# Patient Record
Sex: Male | Born: 1960 | Race: White | Hispanic: No | Marital: Married | Smoking: Current some day smoker
Health system: Southern US, Community
[De-identification: ages and names within clinical notes are randomized; demographics above are authoritative.]

## PROBLEM LIST (undated history)

## (undated) DIAGNOSIS — G5600 Carpal tunnel syndrome, unspecified upper limb: Secondary | ICD-10-CM

## (undated) DIAGNOSIS — J309 Allergic rhinitis, unspecified: Secondary | ICD-10-CM

## (undated) DIAGNOSIS — R1031 Right lower quadrant pain: Secondary | ICD-10-CM

## (undated) DIAGNOSIS — E785 Hyperlipidemia, unspecified: Secondary | ICD-10-CM

## (undated) DIAGNOSIS — R7309 Other abnormal glucose: Secondary | ICD-10-CM

## (undated) DIAGNOSIS — M1990 Unspecified osteoarthritis, unspecified site: Secondary | ICD-10-CM

## (undated) DIAGNOSIS — I10 Essential (primary) hypertension: Secondary | ICD-10-CM

## (undated) DIAGNOSIS — F909 Attention-deficit hyperactivity disorder, unspecified type: Secondary | ICD-10-CM

## (undated) DIAGNOSIS — S22000A Wedge compression fracture of unspecified thoracic vertebra, initial encounter for closed fracture: Secondary | ICD-10-CM

## (undated) DIAGNOSIS — I4510 Unspecified right bundle-branch block: Secondary | ICD-10-CM

## (undated) DIAGNOSIS — I2510 Atherosclerotic heart disease of native coronary artery without angina pectoris: Secondary | ICD-10-CM

## (undated) DIAGNOSIS — G609 Hereditary and idiopathic neuropathy, unspecified: Secondary | ICD-10-CM

## (undated) DIAGNOSIS — N200 Calculus of kidney: Secondary | ICD-10-CM

## (undated) DIAGNOSIS — G4730 Sleep apnea, unspecified: Secondary | ICD-10-CM

## (undated) DIAGNOSIS — M545 Low back pain: Secondary | ICD-10-CM

## (undated) DIAGNOSIS — S139XXA Sprain of joints and ligaments of unspecified parts of neck, initial encounter: Secondary | ICD-10-CM

## (undated) DIAGNOSIS — F411 Generalized anxiety disorder: Secondary | ICD-10-CM

## (undated) DIAGNOSIS — I639 Cerebral infarction, unspecified: Secondary | ICD-10-CM

## (undated) DIAGNOSIS — C61 Malignant neoplasm of prostate: Secondary | ICD-10-CM

## (undated) DIAGNOSIS — S239XXA Sprain of unspecified parts of thorax, initial encounter: Secondary | ICD-10-CM

## (undated) HISTORY — DX: Calculus of kidney: N20.0

## (undated) HISTORY — DX: Wedge compression fracture of unspecified thoracic vertebra, initial encounter for closed fracture: S22.000A

## (undated) HISTORY — DX: Carpal tunnel syndrome, unspecified upper limb: G56.00

## (undated) HISTORY — DX: Sprain of unspecified parts of thorax, initial encounter: S23.9XXA

## (undated) HISTORY — DX: Unspecified osteoarthritis, unspecified site: M19.90

## (undated) HISTORY — DX: Essential (primary) hypertension: I10

## (undated) HISTORY — DX: Right lower quadrant pain: R10.31

## (undated) HISTORY — DX: Sleep apnea, unspecified: G47.30

## (undated) HISTORY — DX: Hereditary and idiopathic neuropathy, unspecified: G60.9

## (undated) HISTORY — DX: Secondary malignant neoplasm of other specified sites: C61

## (undated) HISTORY — DX: Generalized anxiety disorder: F41.1

## (undated) HISTORY — DX: Allergic rhinitis, unspecified: J30.9

## (undated) HISTORY — PX: KNEE ARTHROSCOPY: SHX127

## (undated) HISTORY — DX: Hyperlipidemia, unspecified: E78.5

## (undated) HISTORY — PX: BIOPSY PROSTATE: PRO28

## (undated) HISTORY — DX: Cerebral infarction, unspecified: I63.9

## (undated) HISTORY — DX: Other abnormal glucose: R73.09

## (undated) HISTORY — DX: Sprain of joints and ligaments of unspecified parts of neck, initial encounter: S13.9XXA

## (undated) HISTORY — DX: Low back pain: M54.5

---

## 2001-03-15 ENCOUNTER — Encounter: Payer: Self-pay | Admitting: Emergency Medicine

## 2001-03-15 ENCOUNTER — Observation Stay (HOSPITAL_COMMUNITY): Admission: EM | Admit: 2001-03-15 | Discharge: 2001-03-16 | Payer: Self-pay | Admitting: Emergency Medicine

## 2001-03-16 ENCOUNTER — Encounter: Payer: Self-pay | Admitting: *Deleted

## 2002-04-27 ENCOUNTER — Emergency Department (HOSPITAL_COMMUNITY): Admission: EM | Admit: 2002-04-27 | Discharge: 2002-04-27 | Payer: Self-pay | Admitting: Emergency Medicine

## 2002-10-29 ENCOUNTER — Emergency Department (HOSPITAL_COMMUNITY): Admission: AC | Admit: 2002-10-29 | Discharge: 2002-10-29 | Payer: Self-pay

## 2002-10-29 ENCOUNTER — Encounter: Payer: Self-pay | Admitting: Emergency Medicine

## 2002-12-02 ENCOUNTER — Encounter: Payer: Self-pay | Admitting: Family Medicine

## 2002-12-02 ENCOUNTER — Encounter: Admission: RE | Admit: 2002-12-02 | Discharge: 2002-12-02 | Payer: Self-pay | Admitting: Family Medicine

## 2002-12-04 ENCOUNTER — Encounter: Payer: Self-pay | Admitting: Family Medicine

## 2002-12-04 ENCOUNTER — Encounter: Admission: RE | Admit: 2002-12-04 | Discharge: 2002-12-04 | Payer: Self-pay | Admitting: Family Medicine

## 2002-12-20 ENCOUNTER — Encounter: Payer: Self-pay | Admitting: Family Medicine

## 2004-08-08 ENCOUNTER — Ambulatory Visit: Payer: Self-pay | Admitting: Family Medicine

## 2004-08-19 ENCOUNTER — Ambulatory Visit: Payer: Self-pay | Admitting: Family Medicine

## 2004-09-03 ENCOUNTER — Ambulatory Visit: Payer: Self-pay | Admitting: Family Medicine

## 2004-09-24 ENCOUNTER — Ambulatory Visit: Payer: Self-pay | Admitting: Family Medicine

## 2005-06-26 ENCOUNTER — Ambulatory Visit: Payer: Self-pay | Admitting: Family Medicine

## 2005-07-17 ENCOUNTER — Ambulatory Visit: Payer: Self-pay | Admitting: Family Medicine

## 2005-08-15 ENCOUNTER — Ambulatory Visit: Payer: Self-pay | Admitting: Family Medicine

## 2005-09-05 ENCOUNTER — Ambulatory Visit (HOSPITAL_COMMUNITY): Admission: RE | Admit: 2005-09-05 | Discharge: 2005-09-05 | Payer: Self-pay | Admitting: *Deleted

## 2006-05-06 ENCOUNTER — Ambulatory Visit: Payer: Self-pay | Admitting: Family Medicine

## 2006-05-06 LAB — CONVERTED CEMR LAB
ALT: 25 units/L (ref 0–40)
AST: 20 units/L (ref 0–37)
Albumin: 4.2 g/dL (ref 3.5–5.2)
Alkaline Phosphatase: 48 units/L (ref 39–117)
BUN: 21 mg/dL (ref 6–23)
Basophils Absolute: 0.1 10*3/uL (ref 0.0–0.1)
Basophils Relative: 0.7 % (ref 0.0–1.0)
CO2: 30 meq/L (ref 19–32)
Calcium: 9.4 mg/dL (ref 8.4–10.5)
Chloride: 103 meq/L (ref 96–112)
Cholesterol: 183 mg/dL (ref 0–200)
Creatinine, Ser: 1.2 mg/dL (ref 0.4–1.5)
Direct LDL: 108.9 mg/dL
Eosinophils Absolute: 0.1 10*3/uL (ref 0.0–0.6)
Eosinophils Relative: 1.5 % (ref 0.0–5.0)
GFR calc Af Amer: 83 mL/min
GFR calc non Af Amer: 69 mL/min
Glucose, Bld: 93 mg/dL (ref 70–99)
HCT: 40.4 % (ref 39.0–52.0)
HDL: 33 mg/dL — ABNORMAL LOW (ref >39.0)
Hemoglobin: 14.2 g/dL (ref 13.0–17.0)
Lymphocytes Relative: 25 % (ref 12.0–46.0)
MCHC: 35.2 g/dL (ref 30.0–36.0)
MCV: 85.2 fL (ref 78.0–100.0)
Monocytes Absolute: 0.5 10*3/uL (ref 0.2–0.7)
Monocytes Relative: 5.4 % (ref 3.0–11.0)
Neutro Abs: 6.7 10*3/uL (ref 1.4–7.7)
Neutrophils Relative %: 67.4 % (ref 43.0–77.0)
Platelets: 173 10*3/uL (ref 150–400)
Potassium: 3.9 meq/L (ref 3.5–5.1)
RBC: 4.74 M/uL (ref 4.22–5.81)
RDW: 11.8 % (ref 11.5–14.6)
Sodium: 140 meq/L (ref 135–145)
TSH: 1.15 microintl units/mL (ref 0.35–5.50)
Total Bilirubin: 0.9 mg/dL (ref 0.3–1.2)
Total CHOL/HDL Ratio: 5.5
Total Protein: 7.2 g/dL (ref 6.0–8.3)
Triglycerides: 272 mg/dL (ref 0–149)
VLDL: 54 mg/dL — ABNORMAL HIGH (ref 0–40)
WBC: 9.9 10*3/uL (ref 4.5–10.5)

## 2006-07-20 ENCOUNTER — Ambulatory Visit: Payer: Self-pay | Admitting: Family Medicine

## 2006-09-23 ENCOUNTER — Ambulatory Visit: Payer: Self-pay | Admitting: Family Medicine

## 2006-09-23 LAB — CONVERTED CEMR LAB
ALT: 39 units/L (ref 0–40)
AST: 27 units/L (ref 0–37)
Albumin: 3.7 g/dL (ref 3.5–5.2)
Alkaline Phosphatase: 53 units/L (ref 39–117)
Amylase: 20 units/L — ABNORMAL LOW (ref 27–131)
BUN: 18 mg/dL (ref 6–23)
Basophils Absolute: 0 10*3/uL (ref 0.0–0.1)
Basophils Relative: 0.1 % (ref 0.0–1.0)
Bilirubin, Direct: 0.1 mg/dL (ref 0.0–0.3)
CO2: 26 meq/L (ref 19–32)
Calcium: 8.9 mg/dL (ref 8.4–10.5)
Chloride: 100 meq/L (ref 96–112)
Creatinine, Ser: 1.1 mg/dL (ref 0.4–1.5)
Eosinophils Absolute: 0 10*3/uL (ref 0.0–0.6)
Eosinophils Relative: 0.6 % (ref 0.0–5.0)
GFR calc Af Amer: 92 mL/min
GFR calc non Af Amer: 76 mL/min
Glucose, Bld: 98 mg/dL (ref 70–99)
HCT: 39.2 % (ref 39.0–52.0)
Hemoglobin: 13.7 g/dL (ref 13.0–17.0)
Lymphocytes Relative: 18.8 % (ref 12.0–46.0)
MCHC: 35 g/dL (ref 30.0–36.0)
MCV: 84.6 fL (ref 78.0–100.0)
Monocytes Absolute: 0.6 10*3/uL (ref 0.2–0.7)
Monocytes Relative: 7 % (ref 3.0–11.0)
Neutro Abs: 6.1 10*3/uL (ref 1.4–7.7)
Neutrophils Relative %: 73.5 % (ref 43.0–77.0)
Platelets: 116 10*3/uL — ABNORMAL LOW (ref 150–400)
Potassium: 3.4 meq/L — ABNORMAL LOW (ref 3.5–5.1)
RBC: 4.64 M/uL (ref 4.22–5.81)
RDW: 11.9 % (ref 11.5–14.6)
Sodium: 137 meq/L (ref 135–145)
Total Bilirubin: 0.9 mg/dL (ref 0.3–1.2)
Total Protein: 6.6 g/dL (ref 6.0–8.3)
WBC: 8.2 10*3/uL (ref 4.5–10.5)

## 2006-12-21 DIAGNOSIS — E785 Hyperlipidemia, unspecified: Secondary | ICD-10-CM | POA: Insufficient documentation

## 2006-12-21 DIAGNOSIS — I10 Essential (primary) hypertension: Secondary | ICD-10-CM

## 2006-12-21 HISTORY — DX: Hyperlipidemia, unspecified: E78.5

## 2006-12-21 HISTORY — DX: Essential (primary) hypertension: I10

## 2007-08-13 ENCOUNTER — Ambulatory Visit: Payer: Self-pay | Admitting: Family Medicine

## 2007-08-13 DIAGNOSIS — G4730 Sleep apnea, unspecified: Secondary | ICD-10-CM

## 2007-08-13 DIAGNOSIS — G5600 Carpal tunnel syndrome, unspecified upper limb: Secondary | ICD-10-CM | POA: Insufficient documentation

## 2007-08-13 HISTORY — DX: Carpal tunnel syndrome, unspecified upper limb: G56.00

## 2007-08-13 HISTORY — DX: Sleep apnea, unspecified: G47.30

## 2007-08-13 LAB — CONVERTED CEMR LAB
Bilirubin Urine: NEGATIVE
Blood in Urine, dipstick: NEGATIVE
Glucose, Urine, Semiquant: NEGATIVE
Nitrite: NEGATIVE
Specific Gravity, Urine: 1.025
Urobilinogen, UA: 0.2
WBC Urine, dipstick: NEGATIVE
pH: 6.5

## 2007-08-17 LAB — CONVERTED CEMR LAB
ALT: 24 units/L (ref 0–53)
AST: 17 units/L (ref 0–37)
Albumin: 4.3 g/dL (ref 3.5–5.2)
Alkaline Phosphatase: 51 units/L (ref 39–117)
BUN: 17 mg/dL (ref 6–23)
Basophils Absolute: 0 10*3/uL (ref 0.0–0.1)
Basophils Relative: 0 % (ref 0–1)
Bilirubin, Direct: <0.1 mg/dL (ref 0.0–0.3)
CO2: 26 meq/L (ref 19–32)
Calcium: 9.7 mg/dL (ref 8.4–10.5)
Chloride: 106 meq/L (ref 96–112)
Creatinine, Ser: 1.06 mg/dL (ref 0.40–1.50)
Eosinophils Absolute: 0.2 10*3/uL (ref 0.0–0.7)
Eosinophils Relative: 2 % (ref 0–5)
Glucose, Bld: 89 mg/dL (ref 70–99)
HCT: 41.6 % (ref 39.0–52.0)
Hemoglobin: 13.6 g/dL (ref 13.0–17.0)
Lymphocytes Relative: 38 % (ref 12–46)
Lymphs Abs: 2.8 10*3/uL (ref 0.7–4.0)
MCHC: 32.7 g/dL (ref 30.0–36.0)
MCV: 87.4 fL (ref 78.0–100.0)
Monocytes Absolute: 0.5 10*3/uL (ref 0.1–1.0)
Monocytes Relative: 6 % (ref 3–12)
Neutro Abs: 3.8 10*3/uL (ref 1.7–7.7)
Neutrophils Relative %: 52 % (ref 43–77)
Platelets: 166 10*3/uL (ref 150–400)
Potassium: 4.3 meq/L (ref 3.5–5.3)
RBC: 4.76 M/uL (ref 4.22–5.81)
RDW: 13.1 % (ref 11.5–15.5)
Sodium: 141 meq/L (ref 135–145)
TSH: 2.563 microintl units/mL (ref 0.350–5.50)
Total Bilirubin: 0.3 mg/dL (ref 0.3–1.2)
Total Protein: 7.1 g/dL (ref 6.0–8.3)
WBC: 7.2 10*3/uL (ref 4.0–10.5)

## 2007-08-30 ENCOUNTER — Ambulatory Visit (HOSPITAL_BASED_OUTPATIENT_CLINIC_OR_DEPARTMENT_OTHER): Admission: RE | Admit: 2007-08-30 | Discharge: 2007-08-30 | Payer: Self-pay | Admitting: Pulmonary Disease

## 2007-08-30 ENCOUNTER — Ambulatory Visit: Payer: Self-pay | Admitting: Pulmonary Disease

## 2007-08-30 DIAGNOSIS — J309 Allergic rhinitis, unspecified: Secondary | ICD-10-CM | POA: Insufficient documentation

## 2007-08-30 HISTORY — DX: Allergic rhinitis, unspecified: J30.9

## 2007-09-16 ENCOUNTER — Ambulatory Visit: Payer: Self-pay | Admitting: Pulmonary Disease

## 2007-09-16 ENCOUNTER — Telehealth (INDEPENDENT_AMBULATORY_CARE_PROVIDER_SITE_OTHER): Payer: Self-pay | Admitting: *Deleted

## 2007-10-04 ENCOUNTER — Ambulatory Visit: Payer: Self-pay | Admitting: Pulmonary Disease

## 2007-11-03 ENCOUNTER — Encounter: Payer: Self-pay | Admitting: Pulmonary Disease

## 2007-11-05 ENCOUNTER — Telehealth: Payer: Self-pay | Admitting: Family Medicine

## 2007-11-10 ENCOUNTER — Telehealth: Payer: Self-pay | Admitting: Pulmonary Disease

## 2008-01-31 ENCOUNTER — Telehealth: Payer: Self-pay | Admitting: Family Medicine

## 2008-04-03 ENCOUNTER — Ambulatory Visit: Payer: Self-pay | Admitting: Family Medicine

## 2008-05-01 ENCOUNTER — Ambulatory Visit: Payer: Self-pay | Admitting: Family Medicine

## 2008-08-02 ENCOUNTER — Ambulatory Visit: Payer: Self-pay | Admitting: Family Medicine

## 2008-08-02 DIAGNOSIS — M1990 Unspecified osteoarthritis, unspecified site: Secondary | ICD-10-CM

## 2008-08-02 HISTORY — DX: Unspecified osteoarthritis, unspecified site: M19.90

## 2008-08-02 LAB — CONVERTED CEMR LAB: Rapid Strep: NEGATIVE

## 2008-08-28 ENCOUNTER — Emergency Department (HOSPITAL_COMMUNITY): Admission: EM | Admit: 2008-08-28 | Discharge: 2008-08-28 | Payer: Self-pay | Admitting: Emergency Medicine

## 2008-08-28 ENCOUNTER — Encounter: Payer: Self-pay | Admitting: Family Medicine

## 2008-08-28 DIAGNOSIS — S139XXA Sprain of joints and ligaments of unspecified parts of neck, initial encounter: Secondary | ICD-10-CM

## 2008-08-28 HISTORY — DX: Sprain of joints and ligaments of unspecified parts of neck, initial encounter: S13.9XXA

## 2008-08-29 ENCOUNTER — Ambulatory Visit: Payer: Self-pay | Admitting: Family Medicine

## 2008-09-05 ENCOUNTER — Encounter: Admission: RE | Admit: 2008-09-05 | Discharge: 2008-11-06 | Payer: Self-pay | Admitting: Family Medicine

## 2008-09-07 ENCOUNTER — Telehealth: Payer: Self-pay | Admitting: Family Medicine

## 2008-09-12 ENCOUNTER — Encounter: Payer: Self-pay | Admitting: Family Medicine

## 2008-09-13 ENCOUNTER — Ambulatory Visit: Payer: Self-pay | Admitting: Family Medicine

## 2008-09-13 DIAGNOSIS — S239XXA Sprain of unspecified parts of thorax, initial encounter: Secondary | ICD-10-CM

## 2008-09-13 HISTORY — DX: Sprain of unspecified parts of thorax, initial encounter: S23.9XXA

## 2008-09-20 ENCOUNTER — Ambulatory Visit (HOSPITAL_COMMUNITY): Admission: RE | Admit: 2008-09-20 | Discharge: 2008-09-20 | Payer: Self-pay | Admitting: Family Medicine

## 2008-09-20 ENCOUNTER — Encounter: Payer: Self-pay | Admitting: Family Medicine

## 2008-10-02 ENCOUNTER — Ambulatory Visit: Payer: Self-pay | Admitting: Family Medicine

## 2008-10-02 DIAGNOSIS — M545 Low back pain, unspecified: Secondary | ICD-10-CM | POA: Insufficient documentation

## 2008-10-02 HISTORY — DX: Low back pain, unspecified: M54.50

## 2008-10-11 ENCOUNTER — Encounter: Payer: Self-pay | Admitting: Family Medicine

## 2008-10-27 ENCOUNTER — Encounter: Payer: Self-pay | Admitting: Family Medicine

## 2008-10-30 ENCOUNTER — Ambulatory Visit: Payer: Self-pay | Admitting: Family Medicine

## 2008-11-03 ENCOUNTER — Encounter: Payer: Self-pay | Admitting: Family Medicine

## 2008-11-17 ENCOUNTER — Ambulatory Visit: Payer: Self-pay | Admitting: Family Medicine

## 2008-11-17 DIAGNOSIS — F411 Generalized anxiety disorder: Secondary | ICD-10-CM

## 2008-11-17 HISTORY — DX: Generalized anxiety disorder: F41.1

## 2008-11-30 ENCOUNTER — Encounter: Payer: Self-pay | Admitting: Family Medicine

## 2008-12-20 ENCOUNTER — Encounter: Payer: Self-pay | Admitting: Family Medicine

## 2009-01-03 ENCOUNTER — Ambulatory Visit: Payer: Self-pay | Admitting: Family Medicine

## 2009-01-03 LAB — CONVERTED CEMR LAB
Bilirubin Urine: NEGATIVE
Blood in Urine, dipstick: NEGATIVE
Glucose, Urine, Semiquant: NEGATIVE
Nitrite: NEGATIVE
Specific Gravity, Urine: 1.025
Urobilinogen, UA: 0.2
WBC Urine, dipstick: NEGATIVE
pH: 6

## 2009-01-05 LAB — CONVERTED CEMR LAB
ALT: 39 units/L (ref 0–53)
AST: 30 units/L (ref 0–37)
Albumin: 4.4 g/dL (ref 3.5–5.2)
Alkaline Phosphatase: 55 units/L (ref 39–117)
BUN: 20 mg/dL (ref 6–23)
Basophils Absolute: 0 10*3/uL (ref 0.0–0.1)
Basophils Relative: 0.7 % (ref 0.0–3.0)
Bilirubin, Direct: 0 mg/dL (ref 0.0–0.3)
CO2: 31 meq/L (ref 19–32)
Calcium: 9.4 mg/dL (ref 8.4–10.5)
Chloride: 107 meq/L (ref 96–112)
Cholesterol: 193 mg/dL (ref 0–200)
Creatinine, Ser: 1 mg/dL (ref 0.4–1.5)
Direct LDL: 131.5 mg/dL
Eosinophils Absolute: 0.1 10*3/uL (ref 0.0–0.7)
Eosinophils Relative: 2 % (ref 0.0–5.0)
GFR calc non Af Amer: 83.98 mL/min (ref >60)
Glucose, Bld: 96 mg/dL (ref 70–99)
HCT: 42.6 % (ref 39.0–52.0)
HDL: 31.5 mg/dL — ABNORMAL LOW (ref >39.00)
Hemoglobin: 14.7 g/dL (ref 13.0–17.0)
Lymphocytes Relative: 32.1 % (ref 12.0–46.0)
Lymphs Abs: 2.3 10*3/uL (ref 0.7–4.0)
MCHC: 34.5 g/dL (ref 30.0–36.0)
MCV: 85.8 fL (ref 78.0–100.0)
Monocytes Absolute: 0.4 10*3/uL (ref 0.1–1.0)
Monocytes Relative: 5.5 % (ref 3.0–12.0)
Neutro Abs: 4.3 10*3/uL (ref 1.4–7.7)
Neutrophils Relative %: 59.7 % (ref 43.0–77.0)
PSA: 1.07 ng/mL (ref 0.10–4.00)
Platelets: 153 10*3/uL (ref 150.0–400.0)
Potassium: 4.2 meq/L (ref 3.5–5.1)
RBC: 4.97 M/uL (ref 4.22–5.81)
RDW: 12.5 % (ref 11.5–14.6)
Sodium: 144 meq/L (ref 135–145)
TSH: 1.89 microintl units/mL (ref 0.35–5.50)
Total Bilirubin: 0.9 mg/dL (ref 0.3–1.2)
Total CHOL/HDL Ratio: 6
Total Protein: 7.5 g/dL (ref 6.0–8.3)
Triglycerides: 213 mg/dL — ABNORMAL HIGH (ref 0.0–149.0)
VLDL: 42.6 mg/dL — ABNORMAL HIGH (ref 0.0–40.0)
WBC: 7.1 10*3/uL (ref 4.5–10.5)

## 2009-01-10 ENCOUNTER — Ambulatory Visit: Payer: Self-pay | Admitting: Family Medicine

## 2009-01-17 ENCOUNTER — Telehealth (INDEPENDENT_AMBULATORY_CARE_PROVIDER_SITE_OTHER): Payer: Self-pay | Admitting: *Deleted

## 2009-01-18 ENCOUNTER — Encounter (HOSPITAL_COMMUNITY): Admission: RE | Admit: 2009-01-18 | Discharge: 2009-03-16 | Payer: Self-pay | Admitting: Cardiology

## 2009-01-18 ENCOUNTER — Encounter: Payer: Self-pay | Admitting: Family Medicine

## 2009-01-18 ENCOUNTER — Ambulatory Visit: Payer: Self-pay

## 2009-01-18 ENCOUNTER — Ambulatory Visit: Payer: Self-pay | Admitting: Cardiology

## 2009-01-18 ENCOUNTER — Ambulatory Visit (HOSPITAL_COMMUNITY): Admission: RE | Admit: 2009-01-18 | Discharge: 2009-01-18 | Payer: Self-pay | Admitting: Cardiology

## 2009-01-26 ENCOUNTER — Telehealth: Payer: Self-pay | Admitting: Family Medicine

## 2009-01-31 ENCOUNTER — Ambulatory Visit: Payer: Self-pay | Admitting: Family Medicine

## 2009-01-31 DIAGNOSIS — R7309 Other abnormal glucose: Secondary | ICD-10-CM

## 2009-01-31 HISTORY — DX: Other abnormal glucose: R73.09

## 2009-02-01 ENCOUNTER — Ambulatory Visit: Payer: Self-pay | Admitting: Family Medicine

## 2009-02-01 ENCOUNTER — Telehealth: Payer: Self-pay | Admitting: Family Medicine

## 2009-02-02 LAB — CONVERTED CEMR LAB
Glucose, 1 Hour GTT: 177 mg/dL
Glucose, 2 hour: 116 mg/dL
Glucose, Fasting: 102 mg/dL — ABNORMAL HIGH (ref 70–99)
Glucose, GTT - 3 Hour: 76 mg/dL

## 2009-02-05 LAB — CONVERTED CEMR LAB
ALT: 30 units/L (ref 0–53)
AST: 20 units/L (ref 0–37)
Albumin: 4.3 g/dL (ref 3.5–5.2)
Alkaline Phosphatase: 53 units/L (ref 39–117)
BUN: 12 mg/dL (ref 6–23)
Basophils Absolute: 0 10*3/uL (ref 0.0–0.1)
Basophils Relative: 0.6 % (ref 0.0–3.0)
Bilirubin, Direct: 0 mg/dL (ref 0.0–0.3)
CO2: 29 meq/L (ref 19–32)
Calcium: 9.3 mg/dL (ref 8.4–10.5)
Chloride: 106 meq/L (ref 96–112)
Creatinine, Ser: 0.9 mg/dL (ref 0.4–1.5)
Eosinophils Absolute: 0.2 10*3/uL (ref 0.0–0.7)
Eosinophils Relative: 2.7 % (ref 0.0–5.0)
GFR calc non Af Amer: 94.81 mL/min (ref >60)
Glucose, Bld: 106 mg/dL — ABNORMAL HIGH (ref 70–99)
HCT: 44.4 % (ref 39.0–52.0)
Hemoglobin: 14.9 g/dL (ref 13.0–17.0)
Hgb A1c MFr Bld: 5.7 % (ref 4.6–6.5)
Lymphocytes Relative: 29.7 % (ref 12.0–46.0)
Lymphs Abs: 2.5 10*3/uL (ref 0.7–4.0)
MCHC: 33.5 g/dL (ref 30.0–36.0)
MCV: 89.7 fL (ref 78.0–100.0)
Monocytes Absolute: 0.3 10*3/uL (ref 0.1–1.0)
Monocytes Relative: 4.2 % (ref 3.0–12.0)
Neutro Abs: 5.3 10*3/uL (ref 1.4–7.7)
Neutrophils Relative %: 62.8 % (ref 43.0–77.0)
Platelets: 164 10*3/uL (ref 150.0–400.0)
Potassium: 4.3 meq/L (ref 3.5–5.1)
RBC: 4.95 M/uL (ref 4.22–5.81)
RDW: 12.3 % (ref 11.5–14.6)
Sodium: 143 meq/L (ref 135–145)
TSH: 1.01 microintl units/mL (ref 0.35–5.50)
Total Bilirubin: 0.6 mg/dL (ref 0.3–1.2)
Total Protein: 6.7 g/dL (ref 6.0–8.3)
WBC: 8.3 10*3/uL (ref 4.5–10.5)

## 2009-02-06 ENCOUNTER — Encounter: Payer: Self-pay | Admitting: Family Medicine

## 2009-02-07 ENCOUNTER — Ambulatory Visit: Payer: Self-pay | Admitting: Gastroenterology

## 2009-02-20 ENCOUNTER — Telehealth: Payer: Self-pay | Admitting: Gastroenterology

## 2009-02-21 ENCOUNTER — Ambulatory Visit: Payer: Self-pay | Admitting: Gastroenterology

## 2009-02-21 ENCOUNTER — Encounter: Payer: Self-pay | Admitting: Gastroenterology

## 2009-02-21 HISTORY — PX: COLONOSCOPY: SHX174

## 2009-02-23 ENCOUNTER — Encounter: Payer: Self-pay | Admitting: Gastroenterology

## 2009-02-23 LAB — HM COLONOSCOPY

## 2009-03-23 ENCOUNTER — Ambulatory Visit: Payer: Self-pay | Admitting: Family Medicine

## 2009-03-23 LAB — CONVERTED CEMR LAB: Rapid Strep: POSITIVE

## 2009-04-19 ENCOUNTER — Telehealth: Payer: Self-pay | Admitting: Family Medicine

## 2009-07-11 ENCOUNTER — Ambulatory Visit: Payer: Self-pay | Admitting: Family Medicine

## 2009-08-07 ENCOUNTER — Ambulatory Visit: Payer: Self-pay | Admitting: Family Medicine

## 2009-08-07 LAB — CONVERTED CEMR LAB
Blood in Urine, dipstick: NEGATIVE
Glucose, Urine, Semiquant: NEGATIVE
Ketones, urine, test strip: NEGATIVE
Nitrite: NEGATIVE
Protein, U semiquant: 30
Specific Gravity, Urine: 1.015
Urobilinogen, UA: 0.2
pH: 6.5

## 2009-08-15 ENCOUNTER — Ambulatory Visit: Payer: Self-pay | Admitting: Family Medicine

## 2009-08-15 DIAGNOSIS — G609 Hereditary and idiopathic neuropathy, unspecified: Secondary | ICD-10-CM

## 2009-08-15 HISTORY — DX: Hereditary and idiopathic neuropathy, unspecified: G60.9

## 2009-10-18 ENCOUNTER — Ambulatory Visit: Payer: Self-pay | Admitting: Family Medicine

## 2009-10-18 ENCOUNTER — Telehealth: Payer: Self-pay | Admitting: Family Medicine

## 2009-10-18 DIAGNOSIS — R1031 Right lower quadrant pain: Secondary | ICD-10-CM

## 2009-10-18 HISTORY — DX: Right lower quadrant pain: R10.31

## 2009-10-18 LAB — CONVERTED CEMR LAB
Blood in Urine, dipstick: NEGATIVE
Glucose, Urine, Semiquant: NEGATIVE
Ketones, urine, test strip: NEGATIVE
Nitrite: NEGATIVE
Specific Gravity, Urine: 1.025
Urobilinogen, UA: 0.2
WBC Urine, dipstick: NEGATIVE
pH: 7

## 2009-10-19 LAB — CONVERTED CEMR LAB
Basophils Absolute: 0.1 10*3/uL (ref 0.0–0.1)
Basophils Relative: 0.5 % (ref 0.0–3.0)
Eosinophils Absolute: 0.1 10*3/uL (ref 0.0–0.7)
Eosinophils Relative: 0.5 % (ref 0.0–5.0)
HCT: 47.1 % (ref 39.0–52.0)
Hemoglobin: 16.3 g/dL (ref 13.0–17.0)
Lymphocytes Relative: 12.6 % (ref 12.0–46.0)
Lymphs Abs: 1.5 10*3/uL (ref 0.7–4.0)
MCHC: 34.5 g/dL (ref 30.0–36.0)
MCV: 88.2 fL (ref 78.0–100.0)
Monocytes Absolute: 0.5 10*3/uL (ref 0.1–1.0)
Monocytes Relative: 4.2 % (ref 3.0–12.0)
Neutro Abs: 10 10*3/uL — ABNORMAL HIGH (ref 1.4–7.7)
Neutrophils Relative %: 82.2 % — ABNORMAL HIGH (ref 43.0–77.0)
Platelets: 159 10*3/uL (ref 150.0–400.0)
RBC: 5.34 M/uL (ref 4.22–5.81)
RDW: 12.8 % (ref 11.5–14.6)
WBC: 12.1 10*3/uL — ABNORMAL HIGH (ref 4.5–10.5)

## 2010-01-03 ENCOUNTER — Ambulatory Visit: Payer: Self-pay | Admitting: Family Medicine

## 2010-01-03 LAB — CONVERTED CEMR LAB
Bilirubin Urine: NEGATIVE
Blood in Urine, dipstick: NEGATIVE
Glucose, Urine, Semiquant: NEGATIVE
Ketones, urine, test strip: NEGATIVE
Nitrite: NEGATIVE
Protein, U semiquant: NEGATIVE
Specific Gravity, Urine: 1.02
Urobilinogen, UA: 0.2
WBC Urine, dipstick: NEGATIVE
pH: 7

## 2010-01-04 LAB — CONVERTED CEMR LAB
ALT: 21 units/L (ref 0–53)
AST: 21 units/L (ref 0–37)
Albumin: 4.3 g/dL (ref 3.5–5.2)
Alkaline Phosphatase: 45 units/L (ref 39–117)
BUN: 19 mg/dL (ref 6–23)
Basophils Absolute: 0.1 10*3/uL (ref 0.0–0.1)
Basophils Relative: 0.5 % (ref 0.0–3.0)
Bilirubin, Direct: 0.1 mg/dL (ref 0.0–0.3)
CO2: 25 meq/L (ref 19–32)
Calcium: 9.4 mg/dL (ref 8.4–10.5)
Chloride: 105 meq/L (ref 96–112)
Cholesterol: 208 mg/dL — ABNORMAL HIGH (ref 0–200)
Creatinine, Ser: 0.8 mg/dL (ref 0.4–1.5)
Direct LDL: 135.4 mg/dL
Eosinophils Absolute: 0.2 10*3/uL (ref 0.0–0.7)
Eosinophils Relative: 2 % (ref 0.0–5.0)
GFR calc non Af Amer: 102.29 mL/min (ref >60)
Glucose, Bld: 91 mg/dL (ref 70–99)
HCT: 44 % (ref 39.0–52.0)
HDL: 32.9 mg/dL — ABNORMAL LOW (ref >39.00)
Hemoglobin: 15 g/dL (ref 13.0–17.0)
Lymphocytes Relative: 26.1 % (ref 12.0–46.0)
Lymphs Abs: 2.7 10*3/uL (ref 0.7–4.0)
MCHC: 34.2 g/dL (ref 30.0–36.0)
MCV: 89.5 fL (ref 78.0–100.0)
Monocytes Absolute: 0.6 10*3/uL (ref 0.1–1.0)
Monocytes Relative: 5.7 % (ref 3.0–12.0)
Neutro Abs: 6.9 10*3/uL (ref 1.4–7.7)
Neutrophils Relative %: 65.7 % (ref 43.0–77.0)
PSA: 1.66 ng/mL (ref 0.10–4.00)
Platelets: 165 10*3/uL (ref 150.0–400.0)
Potassium: 4.6 meq/L (ref 3.5–5.1)
RBC: 4.91 M/uL (ref 4.22–5.81)
RDW: 13.1 % (ref 11.5–14.6)
Sodium: 142 meq/L (ref 135–145)
TSH: 1.25 microintl units/mL (ref 0.35–5.50)
Total Bilirubin: 0.5 mg/dL (ref 0.3–1.2)
Total CHOL/HDL Ratio: 6
Total Protein: 6.8 g/dL (ref 6.0–8.3)
Triglycerides: 170 mg/dL — ABNORMAL HIGH (ref 0.0–149.0)
VLDL: 34 mg/dL (ref 0.0–40.0)
WBC: 10.5 10*3/uL (ref 4.5–10.5)

## 2010-01-11 ENCOUNTER — Encounter: Payer: Self-pay | Admitting: Family Medicine

## 2010-01-11 ENCOUNTER — Ambulatory Visit: Payer: Self-pay | Admitting: Family Medicine

## 2010-03-20 ENCOUNTER — Ambulatory Visit: Payer: Self-pay | Admitting: Family Medicine

## 2010-03-27 ENCOUNTER — Encounter: Payer: Self-pay | Admitting: Family Medicine

## 2010-04-17 ENCOUNTER — Ambulatory Visit
Admission: RE | Admit: 2010-04-17 | Discharge: 2010-04-17 | Payer: Self-pay | Source: Home / Self Care | Attending: Family Medicine | Admitting: Family Medicine

## 2010-04-19 ENCOUNTER — Telehealth: Payer: Self-pay | Admitting: Family Medicine

## 2010-04-25 ENCOUNTER — Encounter: Payer: Self-pay | Admitting: Family Medicine

## 2010-05-13 ENCOUNTER — Telehealth: Payer: Self-pay | Admitting: Family Medicine

## 2010-05-13 ENCOUNTER — Ambulatory Visit: Admit: 2010-05-13 | Payer: Self-pay | Admitting: Family Medicine

## 2010-05-14 NOTE — Assessment & Plan Note (Signed)
Summary: tingling in leg/dm   Vital Signs:  Patient profile:   50 year old male Weight:      223 pounds BMI:     29.53 BP sitting:   136 / 86  (left arm) Cuff size:   regular  Vitals Entered By: Raechel Ache, RN (Aug 15, 2009 10:29 AM) CC: C/o vibrating in R thigh up until last evening.   History of Present Illness: Here initially to discuss a vibration sensation he was feeling in the anterior right thigh intermittently for 2 days. No pain or numbness or weakness. No lower back problems. His prostatic symptoms resolved quickly after we started him on Cipro. Now so far today these vibration sensations seem to have gone away. Also we spoke again about the chronic pain in his neck, and he mentions the possibility of using Prednisone for this.   Allergies (verified): No Known Drug Allergies  Past History:  Past Medical History: Reviewed history from 01/10/2009 and no changes required. ADHD Hypokalemia Kidney Stones thoracic compression fractures, saw Dr. Leslee Home SLEEP APNEA (ICD-780.57) CARPAL TUNNEL SYNDROME (ICD-354.0) HYPERTENSION (ICD-401.9) HYPERLIPIDEMIA (ICD-272.4) Allergic Rhinitis neck pain, sees Dr. Sheran Luz, had epidural steroid injections  Past Surgical History: Reviewed history from 12/21/2006 and no changes required. Arthroscopy L knee - torn cartilage  Review of Systems  The patient denies anorexia, fever, weight loss, weight gain, vision loss, decreased hearing, hoarseness, chest pain, syncope, dyspnea on exertion, peripheral edema, prolonged cough, headaches, hemoptysis, abdominal pain, melena, hematochezia, severe indigestion/heartburn, hematuria, incontinence, genital sores, muscle weakness, suspicious skin lesions, transient blindness, difficulty walking, depression, unusual weight change, abnormal bleeding, enlarged lymph nodes, angioedema, breast masses, and testicular masses.    Physical Exam  General:  Well-developed,well-nourished,in no  acute distress; alert,appropriate and cooperative throughout examination Neurologic:  No cranial nerve deficits noted. Station and gait are normal. Plantar reflexes are down-going bilaterally. DTRs are symmetrical throughout. Sensory, motor and coordinative functions appear intact.   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 1 every 6 hours as needed pain  Complete Medication List: 1)  Vicodin 5-500 Mg Tabs (Hydrocodone-acetaminophen) .Marland Kitchen.. 1 every 6 hours as needed pain 2)  Klonopin 1 Mg Tabs (Clonazepam) .... Two times a day 3)  Lisinopril 20 Mg Tabs (Lisinopril) .... Once daily 4)  Cipro 500 Mg Tabs (Ciprofloxacin hcl) .... Two times a day 5)  Prednisone 10 Mg Tabs (Prednisone) .... As directed  Patient Instructions: 1)  These vbration sensations seem to have resolved, for now. This is consistent with an isolated irritated nerve rather than being part of any generalized issue. he will follow up if it returns. Will try Prednisone on an as needed basis. I wrote down a 3 day taper he could use (60mg /40mg /20mg ) when his pain flares up.  Prescriptions: PREDNISONE 10 MG TABS (PREDNISONE) as directed  #100 x 2   Entered and Authorized by:   Nelwyn Salisbury MD   Signed by:   Nelwyn Salisbury MD on 08/15/2009   Method used:   Electronically to        [facility].* (retail)       [address]       [place], [place]  [address]  Ph: [phone]       Fax: [phone]   RxID:   [id number]
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
## 2010-05-14 NOTE — Miscellaneous (Signed)
Summary: Progress Note/[facility]  Progress Note/[facility]   Imported By: Maryln Gottron 11/20/2008 15:24:48  _____________________________________________________________________  External Attachment:    Type:   Image     Comment:   External Document

## 2010-05-14 NOTE — Assessment & Plan Note (Signed)
Summary: cpx//ccm   Vital Signs:  Patient profile:   50 year old male Height:      73 inches Weight:      225 pounds Temp:     97.9 degrees F Pulse rate:   89 / minute BP sitting:   120 / 80  (left arm) Cuff size:   large  Vitals Entered By: Pura Spice, RN (January 11, 2010 9:04 AM) CC: cpx ? bites on lower abd left leg rt ankle  been there 3 months    History of Present Illness: 50 yr old male for a cpx. He feels fine and has no concerns. He recently started taking fish oil capsules daily.   Allergies (verified): No Known Drug Allergies  Past History:  Past Medical History: ADHD Hypokalemia Kidney Stones thoracic compression fractures, saw Dr. Leslee Home SLEEP APNEA (ICD-780.57) CARPAL TUNNEL SYNDROME (ICD-354.0) HYPERTENSION (ICD-401.9) HYPERLIPIDEMIA (ICD-272.4) Allergic Rhinitis neck pain, sees Dr. Sheran Luz, had epidural steroid injections normal nuclear cardiac stress test 01-18-09  Past Surgical History: Arthroscopy L knee - torn cartilage colonoscopy 02-21-09 per Dr. Wendall Papa, benign polyps, repeat in 10 yrs  Family History: Reviewed history from 08/30/2007 and no changes required. Family History Diabetes 1st degree relative Family History High cholesterol Family History Hypertension Family History of Cardiovascular disorder     allergies: sister heart disease: mother, father, maternal grandparents, paternal grandparents cancer: maternal grandfather (leukemia), paternal grandmother (colon)  Social History: Reviewed history from 08/30/2007 and no changes required. Occupation: Married Former Smoker Alcohol use-no Regular exercise-yes pt has children.  Review of Systems  The patient denies anorexia, fever, weight loss, weight gain, vision loss, decreased hearing, hoarseness, chest pain, syncope, dyspnea on exertion, peripheral edema, prolonged cough, headaches, hemoptysis, abdominal pain, melena, hematochezia, severe  indigestion/heartburn, hematuria, incontinence, genital sores, muscle weakness, suspicious skin lesions, transient blindness, difficulty walking, depression, unusual weight change, abnormal bleeding, enlarged lymph nodes, angioedema, breast masses, and testicular masses.    Physical Exam  General:  Well-developed,well-nourished,in no acute distress; alert,appropriate and cooperative throughout examination Head:  Normocephalic and atraumatic without obvious abnormalities. No apparent alopecia or balding. Eyes:  No corneal or conjunctival inflammation noted. EOMI. Perrla. Funduscopic exam benign, without hemorrhages, exudates or papilledema. Vision grossly normal. Ears:  External ear exam shows no significant lesions or deformities.  Otoscopic examination reveals clear canals, tympanic membranes are intact bilaterally without bulging, retraction, inflammation or discharge. Hearing is grossly normal bilaterally. Nose:  External nasal examination shows no deformity or inflammation. Nasal mucosa are pink and moist without lesions or exudates. Mouth:  Oral mucosa and oropharynx without lesions or exudates.  Teeth in good repair. Neck:  No deformities, masses, or tenderness noted. Chest Wall:  No deformities, masses, tenderness or gynecomastia noted. Lungs:  Normal respiratory effort, chest expands symmetrically. Lungs are clear to auscultation, no crackles or wheezes. Heart:  Normal rate and regular rhythm. S1 and S2 normal without gallop, murmur, click, rub or other extra sounds. EKG normal with his usual RBBB  Abdomen:  Bowel sounds positive,abdomen soft and non-tender without masses, organomegaly or hernias noted. Rectal:  No external abnormalities noted. Normal sphincter tone. No rectal masses or tenderness. Heme neg. Genitalia:  Testes bilaterally descended without nodularity, tenderness or masses. No scrotal masses or lesions. No penis lesions or urethral discharge. Prostate:  Prostate gland firm  and smooth, no enlargement, nodularity, tenderness, mass, asymmetry or induration. Msk:  No deformity or scoliosis noted of thoracic or lumbar spine.   Pulses:  R and L  carotid,radial,femoral,dorsalis pedis and posterior tibial pulses are full and equal bilaterally Extremities:  No clubbing, cyanosis, edema, or deformity noted with normal full range of motion of all joints.   Neurologic:  No cranial nerve deficits noted. Station and gait are normal. Plantar reflexes are down-going bilaterally. DTRs are symmetrical throughout. Sensory, motor and coordinative functions appear intact. Skin:  Intact without suspicious lesions or rashes Cervical Nodes:  No lymphadenopathy noted Axillary Nodes:  No palpable lymphadenopathy Inguinal Nodes:  No significant adenopathy Psych:  Cognition and judgment appear intact. Alert and cooperative with normal attention span and concentration. No apparent delusions, illusions, hallucinations   Contraindications/Deferment of Procedures/Staging:    Test/Procedure: FLU VAX    Reason for deferment: patient declined   Impression & Recommendations:  Problem # 1:  HEALTH MAINTENANCE EXAM (ICD-V70.0)  Orders: Hemoccult Guaiac-1 spec.(in office) (82270) EKG w/ Interpretation (93000)  Complete Medication List: 1)  Vicodin 5-500 Mg Tabs (Hydrocodone-acetaminophen) .Marland Kitchen.. 1 every 6 hours as needed pain 2)  Klonopin 1 Mg Tabs (Clonazepam) .... Two times a day 3)  Lisinopril 20 Mg Tabs (Lisinopril) .... Once daily 4)  Prednisone 10 Mg Tabs (Prednisone) .... As directed 5)  Lomotil 2.5-0.025 Mg Tabs (Diphenoxylate-atropine) .... 2 qid ac for diarrhea, decrease  as you improve to 1 qid  Other Orders: Tdap => 57yrs IM (95621) Admin 1st Vaccine (30865)  Patient Instructions: 1)  Please schedule a follow-up appointment in 6 months .  Prescriptions: LISINOPRIL 20 MG TABS (LISINOPRIL) once daily  #30 x 11   Entered and Authorized by:   Nelwyn Salisbury MD   Signed by:    Nelwyn Salisbury MD on 01/11/2010   Method used:   Print then Give to Patient   RxID:   [id number] KLONOPIN 1 MG TABS (CLONAZEPAM) two times a day  #60 x 5   Entered and Authorized by:   Nelwyn Salisbury MD   Signed by:   Nelwyn Salisbury MD on 01/11/2010   Method used:   Print then Give to Patient   RxID:   [id number] VICODIN 5-500 MG  TABS (HYDROCODONE-ACETAMINOPHEN) 1 every 6 hours as needed pain  #60 x 5   Entered and Authorized by:   Nelwyn Salisbury MD   Signed by:   Nelwyn Salisbury MD on 01/11/2010   Method used:   Print then Give to Patient   RxID:   [id number]    Immunizations Administered:  Tetanus Vaccine:    Vaccine Type: Tdap    Site: left deltoid    Mfr: GlaxoSmithKline    Dose: 0.5 ml    Route: IM    Given by: Pura Spice, RN    Exp. Date: 02/01/2012    Lot #: [id number]    VIS given: 03/01/08 version given January 11, 2010.

## 2010-05-14 NOTE — Letter (Signed)
Summary: [facility]  [facility]   Imported By: Maryln Gottron 02/12/2009 14:29:43  _____________________________________________________________________  External Attachment:    Type:   Image     Comment:   External Document

## 2010-05-14 NOTE — Progress Notes (Signed)
Summary: order to d/c cpap  Phone Note Call from Patient   Caller: Patient Call For: Riley Hartman Summary of Call: wants order faxed or called in to discontinue his cpap. w/ [facility]  pt # [id number]. Initial call taken by: Tivis Ringer,  November 10, 2007 2:21 PM  Follow-up for Phone Call        Spoke with patient he wants CPAP d/c states he cannot wear it and does not want his insurance billed for it, explained to him he needed to talk to  but you were out of the office and we would make you aware. Renold Genta RCP, LPN  November 10, 2007 2:49 PM    Additional Follow-up for Phone Call Additional follow up Details #1::        will d/c cpap, but see if he would like to consider alternatives such as surgery or dental appliance.  I would be happy to refer him to ent or dental Additional Follow-up by: Barbaraann Share MD,  November 15, 2007 7:52 PM    Additional Follow-up for Phone Call Additional follow up Details #2::    Called spoke with pt.  Advised per [facility] will d/c CPAP machine, inquired if pt was interested in other tx options such as dental appliance or Surgery.  Pt states "NO he is done with it!"  Does not wish to follow-up with [facility].  Wants f/u appt cancelled, and no further tx of his sleep apnea.  Will cancel appt and make [facility] aware. Follow-up by: Cloyde Reams RN,  November 16, 2007 10:45 AM  Additional Follow-up for Phone Call Additional follow up Details #3:: Details for Additional Follow-up Action Taken: noted.  will d/c cpap Additional Follow-up by: Barbaraann Share MD,  November 16, 2007 2:50 PM

## 2010-05-14 NOTE — Progress Notes (Signed)
Summary: LMTCBx2   ---- Converted from flag ---- ---- 09/16/2007 10:19 AM, Cyndia Diver LPN wrote: pt needs ov with dr. Shelle Iron to discuss sleep study results ------------------------------    [facility]  Cyndia Diver LPN  September 15, 1476 10:21 AM   [facility] ... Cyndia Diver LPN  September 20, 2954 5:06 PM   pt made appt with dr. Shelle Iron for june 22 at 10:15  Cyndia Diver LPN  September 20, 2128 11:15 AM

## 2010-05-14 NOTE — Assessment & Plan Note (Signed)
Summary: LEG PAIN // RS   Vital Signs:  Patient profile:   50 year old male Temp:     98.1 degrees F oral BP sitting:   110 / 78  (left arm) Cuff size:   large  Vitals Entered By: Sid Falcon LPN (March 20, 2010 11:59 AM)  History of Present Illness: Patient seen with right calf pain. This occurred last night when he got up to walk. He felt a sharp pain deep right calf muscle region. Pain with walking especially with plantar flexion. Has not applied ice or take any medications. No Achilles pain. No foot pain. No knee pain.  Allergies (verified): No Known Drug Allergies  Past History:  Past Medical History: Last updated: 01/11/2010 ADHD Hypokalemia Kidney Stones thoracic compression fractures, saw Dr. Leslee Home SLEEP APNEA (ICD-780.57) CARPAL TUNNEL SYNDROME (ICD-354.0) HYPERTENSION (ICD-401.9) HYPERLIPIDEMIA (ICD-272.4) Allergic Rhinitis neck pain, sees Dr. Sheran Luz, had epidural steroid injections normal nuclear cardiac stress test 01-18-09  Physical Exam  General:  Well-developed,well-nourished,in no acute distress; alert,appropriate and cooperative throughout examination Lungs:  Normal respiratory effort, chest expands symmetrically. Lungs are clear to auscultation, no crackles or wheezes. Heart:  Normal rate and regular rhythm. S1 and S2 normal without gallop, murmur, click, rub or other extra sounds. Extremities:  right calf reveals no visible swelling or ecchymosis. Tenderness on the medial gastrocnemius. Achilles is fully intact. Has full strength with plantar flexion dorsiflexion but pain with plantar flexion against resistance but not dorsiflexion against resistance   Impression & Recommendations:  Problem # 1:  MUSCLE STRAIN, RIGHT CALF (ICD-844.8) Assessment New continue icing for another couple of days. Refilled etodolac 500 mg twice daily. Start some gentle stretches by next week  Complete Medication List: 1)  Vicodin 5-500 Mg Tabs  (Hydrocodone-acetaminophen) .Marland Kitchen.. 1 every 6 hours as needed pain 2)  Klonopin 1 Mg Tabs (Clonazepam) .... Two times a day 3)  Lisinopril 20 Mg Tabs (Lisinopril) .... Once daily 4)  Prednisone 10 Mg Tabs (Prednisone) .... As directed 5)  Lomotil 2.5-0.025 Mg Tabs (Diphenoxylate-atropine) .... 2 qid ac for diarrhea, decrease  as you improve to 1 qid 6)  Etodolac 500 Mg Tabs (Etodolac) .... One by mouth two times a day as needed  Patient Instructions: 1)  use ice to right calf muscle 2-3 times daily for the next couple of days 2)  Gentle stretching of right calf muscle as the pain starts to resolve Prescriptions: ETODOLAC 500 MG TABS (ETODOLAC) one by mouth two times a day as needed  #60 x 2   Entered and Authorized by:   Evelena Peat MD   Signed by:   Evelena Peat MD on 03/20/2010   Method used:   Electronically to        [facility].* (retail)       [address]       [place], [place]  [address]       Ph: [phone]       Fax: [phone]   RxID:   [id number]    Orders Added: 1)  Est. Patient Level III [74259]

## 2010-05-14 NOTE — Progress Notes (Signed)
Summary: refill hydrocodone  Phone Note Refill Request Message from:  Pharmacy on April 19, 2009 8:46 AM  Refills Requested: Medication #1:  VICODIN 5-500 MG  TABS 1 every 6 hours as needed pain Initial call taken by: Alfred Levins, CMA,  April 19, 2009 8:46 AM  Follow-up for Phone Call        call in #60 with 2 rf Follow-up by: Nelwyn Salisbury MD,  April 19, 2009 12:32 PM  Additional Follow-up for Phone Call Additional follow up Details #1::        Rx called to pharmacy Additional Follow-up by: Alfred Levins, CMA,  April 20, 2009 8:52 AM    Prescriptions: VICODIN 5-500 MG  TABS (HYDROCODONE-ACETAMINOPHEN) 1 every 6 hours as needed pain  #60 x 2   Entered by:   Alfred Levins, CMA   Authorized by:   Nelwyn Salisbury MD   Signed by:   Alfred Levins, CMA on 04/20/2009   Method used:   Telephoned to ...       [facility] [address]* (retail)       [address]       [place], [place]  [address]       Ph: [phone]       Fax: [phone]   RxID:   [id number]

## 2010-05-14 NOTE — Assessment & Plan Note (Signed)
Summary: ACUTE/STREP?/SINUSES/RCD   Vital Signs:  Patient profile:   50 year old male Weight:      227 pounds Temp:     97.8 degrees F oral Pulse rate:   107 / minute BP sitting:   122 / 82  (left arm) Cuff size:   large  Vitals Entered By: Alfred Levins, CMA (March 23, 2009 10:09 AM) CC: st x1 day, sinus pressure   History of Present Illness: Here for 2 days of HA and a bad ST. No cough or fever. On Motrin and fluids.   Allergies (verified): No Known Drug Allergies  Past History:  Past Medical History: Reviewed history from 01/10/2009 and no changes required. ADHD Hypokalemia Kidney Stones thoracic compression fractures, saw Dr. Leslee Home SLEEP APNEA (ICD-780.57) CARPAL TUNNEL SYNDROME (ICD-354.0) HYPERTENSION (ICD-401.9) HYPERLIPIDEMIA (ICD-272.4) Allergic Rhinitis neck pain, sees Dr. Sheran Luz, had epidural steroid injections  Review of Systems  The patient denies anorexia, fever, weight loss, weight gain, vision loss, decreased hearing, hoarseness, chest pain, syncope, dyspnea on exertion, peripheral edema, prolonged cough, hemoptysis, abdominal pain, melena, hematochezia, severe indigestion/heartburn, hematuria, incontinence, genital sores, muscle weakness, suspicious skin lesions, transient blindness, difficulty walking, depression, unusual weight change, abnormal bleeding, enlarged lymph nodes, angioedema, breast masses, and testicular masses.    Physical Exam  General:  Well-developed,well-nourished,in no acute distress; alert,appropriate and cooperative throughout examination Head:  Normocephalic and atraumatic without obvious abnormalities. No apparent alopecia or balding. Eyes:  No corneal or conjunctival inflammation noted. EOMI. Perrla. Funduscopic exam benign, without hemorrhages, exudates or papilledema. Vision grossly normal. Ears:  External ear exam shows no significant lesions or deformities.  Otoscopic examination reveals clear canals, tympanic  membranes are intact bilaterally without bulging, retraction, inflammation or discharge. Hearing is grossly normal bilaterally. Nose:  External nasal examination shows no deformity or inflammation. Nasal mucosa are pink and moist without lesions or exudates. Mouth:  posterior OP is red with white exudate Neck:  tender shotty AC nodes Lungs:  Normal respiratory effort, chest expands symmetrically. Lungs are clear to auscultation, no crackles or wheezes.   Impression & Recommendations:  Problem # 1:  EXUDATIVE PHARYNGITIS (ICD-462)  His updated medication list for this problem includes:    Cefdinir 300 Mg Caps (Cefdinir) .Marland Kitchen..Marland Kitchen Two times a day  Orders: Rapid Strep ([address])  Complete Medication List: 1)  Vicodin 5-500 Mg Tabs (Hydrocodone-acetaminophen) .Marland Kitchen.. 1 every 6 hours as needed pain 2)  Klonopin 1 Mg Tabs (Clonazepam) .... Two times a day 3)  Lisinopril 20 Mg Tabs (Lisinopril) .... Once daily 4)  Cefdinir 300 Mg Caps (Cefdinir) .... Two times a day  Patient Instructions: 1)  Please schedule a follow-up appointment as needed .  Prescriptions: CEFDINIR 300 MG CAPS (CEFDINIR) two times a day  #20 x 0   Entered and Authorized by:   Nelwyn Salisbury MD   Signed by:   Nelwyn Salisbury MD on 03/23/2009   Method used:   Electronically to        [facility].* (retail)       [address]       [place], [place]  [address]       Ph: [phone]       Fax: [phone]   RxID:   [id number]   Laboratory Results    Other Tests  Rapid Strep: positive Comments: Rita Ohara  March 23, 2009 10:01 AM
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
## 2010-05-14 NOTE — Assessment & Plan Note (Signed)
Summary: cpx/rcd   Vital Signs:  Patient profile:   50 year old male Height:      73 inches Weight:      245 pounds Temp:     97.9 degrees F oral Pulse rate:   78 / minute BP sitting:   138 / 94  (left arm) Cuff size:   large  Vitals Entered By: Alfred Levins, CMA (January 10, 2009 9:06 AM) CC: cpx   History of Present Illness: 50 yr old male for cpx. he feels good in general. He has had several epidural steroid injections to the neck, and these have been very successful in alleviating his neck pain. He is working and doing anything he wants to now.   Allergies (verified): No Known Drug Allergies  Past History:  Past Medical History: ADHD Hypokalemia Kidney Stones thoracic compression fractures, saw Dr. Leslee Home SLEEP APNEA (ICD-780.57) CARPAL TUNNEL SYNDROME (ICD-354.0) HYPERTENSION (ICD-401.9) HYPERLIPIDEMIA (ICD-272.4) Allergic Rhinitis neck pain, sees Dr. Sheran Luz, had epidural steroid injections  Past Surgical History: Reviewed history from 12/21/2006 and no changes required. Arthroscopy L knee - torn cartilage  Family History: Reviewed history from 08/30/2007 and no changes required. Family History Diabetes 1st degree relative Family History High cholesterol Family History Hypertension Family History of Cardiovascular disorder     allergies: sister heart disease: mother, father, maternal grandparents, paternal grandparents cancer: maternal grandfather (leukemia), paternal grandmother (colon)  Social History: Reviewed history from 08/30/2007 and no changes required. Occupation: Married Former Smoker Alcohol use-no Regular exercise-yes pt has children.  Review of Systems  The patient denies anorexia, fever, weight loss, weight gain, vision loss, decreased hearing, hoarseness, chest pain, syncope, dyspnea on exertion, peripheral edema, prolonged cough, headaches, hemoptysis, abdominal pain, melena, hematochezia, severe  indigestion/heartburn, hematuria, incontinence, genital sores, muscle weakness, suspicious skin lesions, transient blindness, difficulty walking, depression, unusual weight change, abnormal bleeding, enlarged lymph nodes, angioedema, breast masses, and testicular masses.    Physical Exam  General:  overweight-appearing.   Head:  Normocephalic and atraumatic without obvious abnormalities. No apparent alopecia or balding. Eyes:  No corneal or conjunctival inflammation noted. EOMI. Perrla. Funduscopic exam benign, without hemorrhages, exudates or papilledema. Vision grossly normal. Ears:  External ear exam shows no significant lesions or deformities.  Otoscopic examination reveals clear canals, tympanic membranes are intact bilaterally without bulging, retraction, inflammation or discharge. Hearing is grossly normal bilaterally. Nose:  External nasal examination shows no deformity or inflammation. Nasal mucosa are pink and moist without lesions or exudates. Mouth:  Oral mucosa and oropharynx without lesions or exudates.  Teeth in good repair. Neck:  No deformities, masses, or tenderness noted. Chest Wall:  No deformities, masses, tenderness or gynecomastia noted. Lungs:  Normal respiratory effort, chest expands symmetrically. Lungs are clear to auscultation, no crackles or wheezes. Heart:  Normal rate and regular rhythm. S1 and S2 normal without gallop, murmur, click, rub or other extra sounds. EKG shows RBBB (which is new since 2006)  Abdomen:  Bowel sounds positive,abdomen soft and non-tender without masses, organomegaly or hernias noted. Rectal:  No external abnormalities noted. Normal sphincter tone. No rectal masses or tenderness. Heme neg Genitalia:  Testes bilaterally descended without nodularity, tenderness or masses. No scrotal masses or lesions. No penis lesions or urethral discharge. Prostate:  Prostate gland firm and smooth, no enlargement, nodularity, tenderness, mass, asymmetry or  induration. Msk:  No deformity or scoliosis noted of thoracic or lumbar spine.   Pulses:  R and L carotid,radial,femoral,dorsalis pedis and posterior tibial pulses are full and  equal bilaterally Extremities:  No clubbing, cyanosis, edema, or deformity noted with normal full range of motion of all joints.   Neurologic:  No cranial nerve deficits noted. Station and gait are normal. Plantar reflexes are down-going bilaterally. DTRs are symmetrical throughout. Sensory, motor and coordinative functions appear intact. Skin:  Intact without suspicious lesions or rashes Cervical Nodes:  No lymphadenopathy noted Axillary Nodes:  No palpable lymphadenopathy Inguinal Nodes:  No significant adenopathy Psych:  Cognition and judgment appear intact. Alert and cooperative with normal attention span and concentration. No apparent delusions, illusions, hallucinations   Impression & Recommendations:  Problem # 1:  HEALTH MAINTENANCE EXAM (ICD-V70.0)  Orders: Hemoccult Guaiac-1 spec.(in office) ([address]70) EKG w/ Interpretation ([address]) Gastroenterology Referral (GI)  Complete Medication List: 1)  Vicodin 5-500 Mg Tabs (Hydrocodone-acetaminophen) .Marland Kitchen.. 1 every 6 hours as needed pain 2)  Klonopin 1 Mg Tabs (Clonazepam) .... Two times a day 3)  Lisinopril 20 Mg Tabs (Lisinopril) .... Once daily  Other Orders: Cardiology Referral (Cardiology)  Patient Instructions: 1)  It is important that you exercise reguarly at least 20 minutes 5 times a week. If you develop chest pain, have severe difficulty breathing, or feel very tired, stop exercising immediately and seek medical attention.  2)  You need to lose weight. Consider a lower calorie diet and regular exercise.  3)  Schedule a colonoscopy/ sigmoidoscopy to help detect colon cancer.  4)  Because of the EKG changes, we will set up an ECHO and a treadmill stress test Prescriptions: LISINOPRIL 20 MG TABS (LISINOPRIL) once daily  #30 x 11   Entered and Authorized  by:   Nelwyn Salisbury MD   Signed by:   Nelwyn Salisbury MD on 01/10/2009   Method used:   Print then Give to Patient   RxID:   [id number] KLONOPIN 1 MG TABS (CLONAZEPAM) two times a day  #60 x 5   Entered and Authorized by:   Nelwyn Salisbury MD   Signed by:   Nelwyn Salisbury MD on 01/10/2009   Method used:   Print then Give to Patient   RxID:   [id number]

## 2010-05-14 NOTE — Assessment & Plan Note (Signed)
Summary: SINUSES/RCD   Vital Signs:  Patient profile:   50 year old male Weight:      219 pounds Temp:     97.9 degrees F oral BP sitting:   116 / 90  (left arm) Cuff size:   regular  Vitals Entered By: Raechel Ache, RN (July 11, 2009 9:47 AM) CC: C/o sinuses burning and head congestion.   History of Present Illness: Here for 4 days of sinus pressure, HA, PND, ST, and a dry cough. No fever.   Allergies (verified): No Known Drug Allergies  Past History:  Past Medical History: Reviewed history from 01/10/2009 and no changes required. ADHD Hypokalemia Kidney Stones thoracic compression fractures, saw Dr. Leslee Home SLEEP APNEA (ICD-780.57) CARPAL TUNNEL SYNDROME (ICD-354.0) HYPERTENSION (ICD-401.9) HYPERLIPIDEMIA (ICD-272.4) Allergic Rhinitis neck pain, sees Dr. Sheran Luz, had epidural steroid injections  Review of Systems  The patient denies anorexia, fever, weight loss, weight gain, vision loss, decreased hearing, hoarseness, chest pain, syncope, dyspnea on exertion, peripheral edema, hemoptysis, abdominal pain, melena, hematochezia, severe indigestion/heartburn, hematuria, incontinence, genital sores, muscle weakness, suspicious skin lesions, transient blindness, difficulty walking, depression, unusual weight change, abnormal bleeding, enlarged lymph nodes, angioedema, breast masses, and testicular masses.    Physical Exam  General:  Well-developed,well-nourished,in no acute distress; alert,appropriate and cooperative throughout examination Head:  Normocephalic and atraumatic without obvious abnormalities. No apparent alopecia or balding. Eyes:  No corneal or conjunctival inflammation noted. EOMI. Perrla. Funduscopic exam benign, without hemorrhages, exudates or papilledema. Vision grossly normal. Ears:  External ear exam shows no significant lesions or deformities.  Otoscopic examination reveals clear canals, tympanic membranes are intact bilaterally without  bulging, retraction, inflammation or discharge. Hearing is grossly normal bilaterally. Nose:  External nasal examination shows no deformity or inflammation. Nasal mucosa are pink and moist without lesions or exudates. Mouth:  Oral mucosa and oropharynx without lesions or exudates.  Teeth in good repair. Neck:  No deformities, masses, or tenderness noted. Lungs:  Normal respiratory effort, chest expands symmetrically. Lungs are clear to auscultation, no crackles or wheezes.   Impression & Recommendations:  Problem # 1:  ACUTE SINUSITIS, UNSPECIFIED (ICD-461.9)  His updated medication list for this problem includes:    Zithromax Z-pak 250 Mg Tabs (Azithromycin) .Marland Kitchen... As directed  Complete Medication List: 1)  Vicodin 5-500 Mg Tabs (Hydrocodone-acetaminophen) .Marland Kitchen.. 1 every 6 hours as needed pain 2)  Klonopin 1 Mg Tabs (Clonazepam) .... Two times a day 3)  Lisinopril 20 Mg Tabs (Lisinopril) .... Once daily 4)  Zithromax Z-pak 250 Mg Tabs (Azithromycin) .... As directed  Patient Instructions: 1)  Please schedule a follow-up appointment as needed .  Prescriptions: KLONOPIN 1 MG TABS (CLONAZEPAM) two times a day  #60 x 5   Entered and Authorized by:   Nelwyn Salisbury MD   Signed by:   Nelwyn Salisbury MD on 07/11/2009   Method used:   Print then Give to Patient   RxID:   [id number] VICODIN 5-500 MG  TABS (HYDROCODONE-ACETAMINOPHEN) 1 every 6 hours as needed pain  #60 x 5   Entered and Authorized by:   Nelwyn Salisbury MD   Signed by:   Nelwyn Salisbury MD on 07/11/2009   Method used:   Print then Give to Patient   RxID:   [id number] ZITHROMAX Z-PAK 250 MG TABS (AZITHROMYCIN) as directed  #1 x 0   Entered and Authorized by:   Nelwyn Salisbury MD   Signed by:   Nelwyn Salisbury MD  on 07/11/2009   Method used:   Electronically to        [facility].* (retail)       [address]       [place], [place]  [address]       Ph: [phone]       Fax:  [phone]   RxID:   [id number]
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
## 2010-05-14 NOTE — Progress Notes (Signed)
Summary: GTT test results.  Phone Note Call from Patient   Caller: Patient Call For: Nelwyn Salisbury MD Summary of Call: Pt is calling for GTT test results. [phone] Initial call taken by: Lynann Beaver CMA,  February 01, 2009 3:07 PM  Follow-up for Phone Call        see results Follow-up by: Nelwyn Salisbury MD,  February 02, 2009 8:35 AM  Additional Follow-up for Phone Call Additional follow up Details #1::        Phone Call Completed Additional Follow-up by: Alfred Levins, CMA,  February 02, 2009 9:12 AM

## 2010-05-14 NOTE — Letter (Signed)
Summary: [facility]  [facility]   Imported By: Maryln Gottron 12/28/2008 10:48:01  _____________________________________________________________________  External Attachment:    Type:   Image     Comment:   External Document

## 2010-05-14 NOTE — Miscellaneous (Signed)
Summary: Treatment Plan/[facility]  Treatment Plan/[facility]   Imported By: Maryln Gottron 09/25/2008 13:19:42  _____________________________________________________________________  External Attachment:    Type:   Image     Comment:   External Document

## 2010-05-14 NOTE — Miscellaneous (Signed)
Summary: Progress Note/[facility]   Progress Note/[facility]   Imported By: Maryln Gottron 10/13/2008 13:07:55  _____________________________________________________________________  External Attachment:    Type:   Image     Comment:   External Document

## 2010-05-14 NOTE — Progress Notes (Signed)
Summary: treatment low back & L hip also  Phone Note Call from Patient Call back at [phone]   Caller: live Call For: fry Summary of Call: Requests that treatment [facility] be extended to include low back & left hip.  He is having spasms & sharp painthere.  Spoke to them at  treatment center & they said he'd have to call Dr. Clent Ridges to get this done.  Has fu appt there today 11am. Initial call taken by: Rudy Jew, RN,  Sep 07, 2008 9:06 AM  Follow-up for Phone Call        I agree to add these. I'll sign the order when they send it to me Follow-up by: Nelwyn Salisbury MD,  Sep 07, 2008 11:37 AM  Additional Follow-up for Phone Call Additional follow up Details #1::        [facility] will fax order.  Patient also notified. Additional Follow-up by: Rudy Jew, RN,  Sep 07, 2008 12:21 PM

## 2010-05-14 NOTE — Letter (Signed)
Summary: [facility]  [facility]   Imported By: Maryln Gottron 12/07/2008 12:16:31  _____________________________________________________________________  External Attachment:    Type:   Image     Comment:   External Document
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
## 2010-05-14 NOTE — Progress Notes (Signed)
Summary: Nuclear Pre-Procedure  Phone Note Outgoing Call Call back at [facility] Phone [phone]   Call placed by: Stanton Kidney, EMT-P,  January 17, 2009 2:27 PM Summary of Call: Unable to leave message with information on [employer] Information Sheet (see scanned document for details); No answer>"memory full"    Nuclear Med Background Indications for Stress Test: Evaluation for Ischemia, Abnormal EKG        Nuclear Pre-Procedure Cardiac Risk Factors: Hypertension, Lipids, RBBB Height (in): 73
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
## 2010-05-14 NOTE — Letter (Signed)
Summary: [facility]  [facility]   Imported By: Lester Aitkin 11/05/2007 09:21:56  _____________________________________________________________________  External Attachment:    Type:   Image     Comment:   External Document

## 2010-05-14 NOTE — Miscellaneous (Signed)
Summary: Initial Summary for PT Services/[facility]  Initial Summary for PT Services/[facility]   Imported By: Maryln Gottron 09/25/2008 13:16:56  _____________________________________________________________________  External Attachment:    Type:   Image     Comment:   External Document

## 2010-05-14 NOTE — Progress Notes (Signed)
Summary: FYI  Phone Note Call from Patient Call back at [phone]   Caller: patient wife Call For: Clent Ridges Summary of Call: Patient wants [place] to call the place where he got the Cpap.  He cannot [place] it, he can't go to sleep when he has it on.  the phone [phone] we need to call and have them pickup. Initial call taken by: Celine Ahr,  November 05, 2007 4:23 PM  Follow-up for Phone Call        called pt to tell him to call Dr Janina Mayo office & he said his wife misunderstood which dr to call Follow-up by: Alfred Levins, CMA,  November 08, 2007 8:15 AM  Additional Follow-up for Phone Call Additional follow up Details #1::        agreed Additional Follow-up by: Nelwyn Salisbury MD,  November 09, 2007 8:11 AM
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
## 2010-05-14 NOTE — Assessment & Plan Note (Signed)
Summary: 1 month rov/njr   Vital Signs:  Patient Profile:   50 Years Old Male Height:     74 inches Weight:      246 pounds Temp:     97.7 degrees F oral Pulse rate:   101 / minute BP sitting:   128 / 74  (left arm) Cuff size:   large  Vitals Entered By: Alfred Levins, CMA (May 01, 2008 3:31 PM)                 Referred by:  Gershon Crane PCP:  Clent Ridges  Chief Complaint:  f/u on bp.  History of Present Illness: He has been on Amlodipine for the past month, and he feels fine. Does not check his BP.     Current Allergies: No known allergies   Past Medical History:    Reviewed history from 08/30/2007 and no changes required:       ADHD       Hypokalemia       Kidney Stones       thoracic compression fractures, saw Dr. Leslee Home       Current Problems:        SLEEP APNEA (ICD-780.57)       CARPAL TUNNEL SYNDROME (ICD-354.0)       FAMILY HISTORY DIABETES 1ST DEGREE RELATIVE (ICD-V18.0)       HYPERTENSION (ICD-401.9)       HYPERLIPIDEMIA (ICD-272.4)       Allergic Rhinitis     Review of Systems  The patient denies anorexia, fever, weight loss, weight gain, vision loss, decreased hearing, hoarseness, chest pain, syncope, dyspnea on exertion, peripheral edema, prolonged cough, headaches, hemoptysis, abdominal pain, melena, hematochezia, severe indigestion/heartburn, hematuria, incontinence, genital sores, muscle weakness, suspicious skin lesions, transient blindness, difficulty walking, depression, unusual weight change, abnormal bleeding, enlarged lymph nodes, angioedema, breast masses, and testicular masses.     Physical Exam  General:     Well-developed,well-nourished,in no acute distress; alert,appropriate and cooperative throughout examination    Impression & Recommendations:  Problem # 1:  HYPERTENSION (ICD-401.9)  His updated medication list for this problem includes:    Amlodipine Besylate 5 Mg Tabs (Amlodipine besylate) ..... Once daily   Complete  Medication List: 1)  Ativan 0.5 Mg Tabs (Lorazepam) .... As needed 2)  Vicodin 5-500 Mg Tabs (Hydrocodone-acetaminophen) .Marland Kitchen.. 1 every 6 hours as needed pain 3)  Amlodipine Besylate 5 Mg Tabs (Amlodipine besylate) .... Once daily   Patient Instructions: 1)  Please schedule a follow-up appointment in 3 months.

## 2010-05-14 NOTE — Assessment & Plan Note (Signed)
Summary: rov to discuss sleep study   PCP:  Clent Ridges  Chief Complaint:  Ov to discuss sleep study results. .  History of Present Illness:  patient comes in today for follow-up of his recent sleep study.  Surprisingly, this only showed mild obstructive sleep apnea, with a respiratory disturbance index of 6 events/hr.  I have gone over all of the study in great detail with the patient, and have answered all of his questions.     Current Allergies: No known allergies       Vital Signs:  Patient Profile:   50 Years Old Male Weight:      252 pounds O2 Sat:      96 % O2 treatment:    Room Air Temp:     98.0 degrees F oral Pulse rate:   72 / minute BP sitting:   140 / 88  (left arm) Cuff size:   regular  Vitals Entered By: Cyndia Diver LPN (October 04, 2007 10:46 AM)             Comments Medications reviewed with patient Cyndia Diver LPN  October 04, 2007 10:46 AM      Physical Exam  General:     obese male in no acute distress     Impression & Recommendations:  Problem # 1:  SLEEP APNEA (ICD-780.57) the patient has been found to have mild obstructive sleep apnea by nocturnal polysomnography.  However, the patient's symptoms are much more severe, and are greatly interfering with his quality of life and also affecting his driving at times.  Because of this, I would support aggressive treatment for his sleep apnea while he is working on weight loss.  I have gone over the various treatment options with him, including upper airway surgery, oral appliance, and also CPAP.  The patient wishes to try CPAP while he is working on weight loss.   Patient Instructions: 1)  will set you up on cpap 2)  work on weight loss 3)  f/u with me in 4 weeks.   ]

## 2010-05-14 NOTE — Progress Notes (Signed)
Summary: results & EKG issues?  Phone Note Call from Patient Call back at Work Phone [phone]   Caller: vm Call For: fry Summary of Call: Echo & Stress test results.  Got report that said the echo was normal.  What about the stress test?  What does this mean about my problem/issues on EKG on physical?  Anythink else, test results I should know? Initial call taken by: Rudy Jew, RN,  January 26, 2009 2:38 PM  Follow-up for Phone Call        his cardiac stress test was normal as well. The EKG abnormality is nothing to worry about now that we have checked it out. we will continue to monitor this over time, but he does not need to do anything else at this time. he can exercise with no limitations  Follow-up by: Nelwyn Salisbury MD,  January 26, 2009 3:49 PM  Additional Follow-up for Phone Call Additional follow up Details #1::        Phone Call Completed Additional Follow-up by: Alfred Levins, CMA,  January 26, 2009 4:26 PM

## 2010-05-14 NOTE — Assessment & Plan Note (Signed)
Summary: sinus infection/mhf   Vital Signs:  Patient Profile:   50 Years Old Male Height:     74 inches Weight:      239 pounds Temp:     97.7 degrees F oral Pulse rate:   105 / minute BP sitting:   142 / 90  (left arm) Cuff size:   large  Vitals Entered By: Alfred Levins, CMA (April 03, 2008 3:01 PM)                 Referred by:  Gershon Crane PCP:  Clent Ridges  Chief Complaint:  sinus inf?Marland Kitchen  History of Present Illness: One week of stuffy head, sinus pressure, ST, and fever. No cough.     Current Allergies (reviewed today): No known allergies   Past Medical History:    Reviewed history from 08/30/2007 and no changes required:       ADHD       Hypokalemia       Kidney Stones       thoracic compression fractures, saw Dr. Leslee Home       Current Problems:        SLEEP APNEA (ICD-780.57)       CARPAL TUNNEL SYNDROME (ICD-354.0)       FAMILY HISTORY DIABETES 1ST DEGREE RELATIVE (ICD-V18.0)       HYPERTENSION (ICD-401.9)       HYPERLIPIDEMIA (ICD-272.4)       Allergic Rhinitis     Review of Systems  The patient denies anorexia, weight loss, weight gain, vision loss, decreased hearing, hoarseness, chest pain, syncope, dyspnea on exertion, peripheral edema, prolonged cough, hemoptysis, abdominal pain, melena, hematochezia, severe indigestion/heartburn, hematuria, incontinence, genital sores, muscle weakness, suspicious skin lesions, transient blindness, difficulty walking, depression, unusual weight change, abnormal bleeding, enlarged lymph nodes, angioedema, breast masses, and testicular masses.     Physical Exam  General:     Well-developed,well-nourished,in no acute distress; alert,appropriate and cooperative throughout examination Head:     Normocephalic and atraumatic without obvious abnormalities. No apparent alopecia or balding. Eyes:     No corneal or conjunctival inflammation noted. EOMI. Perrla. Funduscopic exam benign, without hemorrhages, exudates or  papilledema. Vision grossly normal. Ears:     External ear exam shows no significant lesions or deformities.  Otoscopic examination reveals clear canals, tympanic membranes are intact bilaterally without bulging, retraction, inflammation or discharge. Hearing is grossly normal bilaterally. Nose:     External nasal examination shows no deformity or inflammation. Nasal mucosa are pink and moist without lesions or exudates. Mouth:     Oral mucosa and oropharynx without lesions or exudates.  Teeth in good repair. Neck:     No deformities, masses, or tenderness noted. Lungs:     Normal respiratory effort, chest expands symmetrically. Lungs are clear to auscultation, no crackles or wheezes. Heart:     Normal rate and regular rhythm. S1 and S2 normal without gallop, murmur, click, rub or other extra sounds.    Impression & Recommendations:  Problem # 1:  ACUTE SINUSITIS, UNSPECIFIED (ICD-461.9)  His updated medication list for this problem includes:    Augmentin 875-125 Mg Tabs (Amoxicillin-pot clavulanate) .Marland Kitchen..Marland Kitchen Two times a day   Problem # 2:  HYPERTENSION (ICD-401.9)  The following medications were removed from the medication list:    Diovan Hct 160-25 Mg Tabs (Valsartan-hydrochlorothiazide) .Marland Kitchen... 1 by mouth once daily  His updated medication list for this problem includes:    Amlodipine Besylate 5 Mg Tabs (Amlodipine besylate) ..... Once daily  Complete Medication List: 1)  Ativan 0.5 Mg Tabs (Lorazepam) .... As needed 2)  Vicodin 5-500 Mg Tabs (Hydrocodone-acetaminophen) .Marland Kitchen.. 1 every 6 hours as needed pain 3)  Augmentin 875-125 Mg Tabs (Amoxicillin-pot clavulanate) .... Two times a day 4)  Amlodipine Besylate 5 Mg Tabs (Amlodipine besylate) .... Once daily   Patient Instructions: 1)  Please schedule a follow-up appointment in 1 month.   Prescriptions: AMLODIPINE BESYLATE 5 MG TABS (AMLODIPINE BESYLATE) once daily  #30 x 11   Entered and Authorized by:   Nelwyn Salisbury MD    Signed by:   Nelwyn Salisbury MD on 04/03/2008   Method used:   Electronically to        [facility] (retail)       [address]       [place], [place]  [address]       Ph: [phone]       Fax: [phone]   RxID:   [id number] AUGMENTIN 875-125 MG TABS (AMOXICILLIN-POT CLAVULANATE) two times a day  #20 x 0   Entered and Authorized by:   Nelwyn Salisbury MD   Signed by:   Nelwyn Salisbury MD on 04/03/2008   Method used:   Electronically to        [facility] (retail)       [address]       [place], [place]  [address]       Ph: [phone]       Fax: [phone]   RxID:   [id number]  ]

## 2010-05-14 NOTE — Letter (Signed)
Summary: Out of Work  [facility] [employer]  [address]   [place], [place] [address]   Phone: [phone]  Fax: [phone]    March 20, 2010   Employee:  JARVIS SAWA    To Whom It May Concern:   For Medical reasons, please excuse the above named employee from work for the following dates:  Start:   03-20-10  End:   03-25-10  If you need additional information, please feel free to contact our office.         Sincerely,    Evelena Peat MD

## 2010-05-14 NOTE — Progress Notes (Signed)
Summary: RLQ Pain  Phone Note Call from Patient   Caller: Pt walks in  Call For: Nelwyn Salisbury MD Summary of Call: Pt walks in with RLQ pain since 2 am and diarrhea x 20 times.  Anorexia, nausea but no vomiting.  No fever.  Cannot eat.  No UTI symptoms. 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  Remote hx of kidney stones.  Per [place], will work in today.  Will obtain UA before seeing MD. Initial call taken by: Lynann Beaver CMA,  October 18, 2009 3:27 PM  Follow-up for Phone Call        appt today with dr Scotty Court  Follow-up by: Pura Spice, RN,  October 18, 2009 3:46 PM

## 2010-05-14 NOTE — Progress Notes (Signed)
Summary: problem with rx  Phone Note Call from Patient Call back at [phone]   Caller: pt live Call For: Clent Ridges Summary of Call: Patient wants to know why his ativan ws denied.  Please call and let him know.  [facility] [address] [facility] [place]. Initial call taken by: Celine Ahr,  January 31, 2008 3:57 PM  Follow-up for Phone Call        asked pharmacy to fax request and they never did.  Can we refill this med? Follow-up by: Alfred Levins, CMA,  January 31, 2008 4:04 PM  Additional Follow-up for Phone Call Additional follow up Details #1::        call in Ativan 0.5 mg every 6 hours as needed anxiety, #60 with 5 rf Additional Follow-up by: Nelwyn Salisbury MD,  January 31, 2008 5:12 PM      Prescriptions: ATIVAN 0.5 MG TABS (LORAZEPAM) as needed  #60 x 5   Entered by:   Alfred Levins, CMA   Authorized by:   Nelwyn Salisbury MD   Signed by:   Alfred Levins, CMA on 01/31/2008   Method used:   Telephoned to ...       [facility] [address] (retail)       [address]       [place], [place]  [address]       Ph: [phone]       Fax: [phone]   RxID:   [id number]

## 2010-05-14 NOTE — Progress Notes (Signed)
Summary: Prep Mix-up  Phone Note Call from Patient Call back at Work Phone [phone]   Caller: Patient Call For: Rob Bunting Reason for Call: Talk to Nurse Details for Reason: Prep Question Summary of Call: Using [employer] Prep misread instructions started prep this morning, what to do? Initial call taken by: Darryl Lent CMA Duncan Dull),  February 20, 2009 8:17 AM  Follow-up for Phone Call        Instructed pt to finish prep since he had started it.  Encouraged to force clear liquids today and instructed again on dosage for tomorrow am.   Follow-up by: Ezra Sites RN,  February 20, 2009 8:34 AM

## 2010-05-14 NOTE — Assessment & Plan Note (Signed)
Summary: Severe abdominal pain/cb   Vital Signs:  Patient profile:   50 year old male Weight:      220 pounds O2 Sat:      95 % Temp:     99 degrees F Pulse rate:   106 / minute BP sitting:   110 / 78  (left arm) Cuff size:   large  Vitals Entered By: Pura Spice, RN (October 18, 2009 4:25 PM) CC: nausea since 2 am alot diarrhea bloody this afternoon severe lower abd    History of Present Illness: catheter 50 year old well-developed well-nourished white male who appears acutely ill complains of nausea since 2 AM this morning has had considerable number of loose stools or diarrhea with blood. 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 Leslee Home SLEEP APNEA (ICD-780.57) CARPAL TUNNEL SYNDROME (ICD-354.0) HYPERTENSION (ICD-401.9) HYPERLIPIDEMIA (ICD-272.4) Allergic Rhinitis neck pain, sees Dr. Sheran Luz, had epidural steroid injections  Past Surgical History: Last updated: 12/21/2006 Arthroscopy L knee - torn cartilage  Social History: Last updated: 08/30/2007 Occupation: Married Former Smoker Alcohol use-no Regular exercise-yes pt has children.  Risk Factors: Smoking Status: quit (12/21/2006)  Review of Systems      See HPI General:  See HPI; Complains of fatigue, malaise, and weakness. Eyes:  Denies blurring, discharge, double vision, eye irritation, eye pain, halos, itching, light sensitivity, red eye, vision loss-1 eye, and vision loss-both eyes. ENT:  Denies decreased hearing, difficulty swallowing, ear discharge, earache, hoarseness, nasal congestion, nosebleeds, postnasal drainage,  ringing in ears, sinus pressure, and sore throat. CV:  Denies bluish discoloration of lips or nails, chest pain or discomfort, difficulty breathing at night, difficulty breathing while lying down, fainting, fatigue, leg cramps with exertion, lightheadness, near fainting, palpitations, shortness of breath with exertion, swelling of feet, swelling of hands, and weight gain; height tension control blood pressure 110/78. Resp:  Denies chest discomfort, chest pain with inspiration, cough, coughing up blood, excessive snoring, hypersomnolence, morning headaches, pleuritic, shortness of breath, sputum productive, and wheezing. GI:  See HPI; Complains of abdominal pain, bloody stools, change in bowel habits, diarrhea, and nausea. GU:  Denies decreased libido, discharge, dysuria, erectile dysfunction, genital sores, hematuria, incontinence, nocturia, urinary frequency, and urinary hesitancy. MS:  Complains of low back pain; history of degenerative disc disease.  Physical Exam  General:  Well-developed well-nourished male who appears hill   been complaining of abdominal pain Head:  Normocephalic and atraumatic without obvious abnormalities. No apparent alopecia or balding. Eyes:  No corneal or conjunctival inflammation noted. EOMI. Perrla. Funduscopic exam benign, without hemorrhages, exudates or papilledema. Vision grossly normal. Ears:  External ear exam shows no significant lesions or deformities.  Otoscopic examination reveals clear canals, tympanic membranes are intact bilaterally without bulging, retraction, inflammation or discharge. Hearing is grossly normal bilaterally. Nose:  External nasal examination shows no deformity or inflammation. Nasal mucosa are pink and moist without lesions or exudates. Mouth:  old mucosa dry Lungs:  Normal respiratory effort, chest expands symmetrically. Lungs are clear to auscultation, no crackles or wheezes. Heart:  Normal rate and regular rhythm. S1 and S2 normal  without gallop, murmur, click, rub or other extra sounds.  Abdomen:  abdominal tenderness over the lower abdomen especially right slightly more than left No rigidity no guarding no rebound tenderness Hyperactive bowel sounds Rectal:  not examined Msk:  No deformity or scoliosis noted of thoracic or lumbar spine.   Pulses:  R and L carotid,radial,femoral,dorsalis pedis and posterior tibial pulses are full and equal bilaterally Extremities:  No clubbing, cyanosis, edema, or deformity noted with normal full range of motion of all joints.   Neurologic:  No cranial nerve deficits noted. Station and gait are normal. Plantar reflexes are down-going bilaterally. DTRs are symmetrical throughout. Sensory, motor and coordinative functions appear intact. Skin:  Intact without suspicious lesions or rashes Cervical Nodes:  No lymphadenopathy noted Axillary Nodes:  No palpable lymphadenopathy Inguinal Nodes:  No significant adenopathy Psych:  Cognition and judgment appear intact. Alert and cooperative with normal attention span and concentration. No apparent delusions, illusions, hallucinations   Impression & Recommendations:  Problem # 1:  DIARRHEA OF PRESUMED INFECTIOUS ORIGIN (ICD-009.3) Assessment New  His updated medication list for this problem includes:    Lomotil 2.5-0.025 Mg Tabs (Diphenoxylate-atropine) .Marland Kitchen... 2 qid ac for diarrhea, decrease  as you improve to 1 qid ciprofloxacin 5 mg b.i.d. Metronidazole 500 mg t.i.d.  Problem # 2:  ABDOMINAL PAIN, RIGHT LOWER QUADRANT (ICD-789.03) Assessment: New  Orders: UA Dipstick w/o Micro (automated)  (81003) TLB-CBC Platelet - w/Differential (85025-CBCD)  Problem # 3:  DEHYDRATION (ICD-276.51) Assessment: New increase fluid intake  Problem # 4:  HYPERTENSION (ICD-401.9) Assessment: Improved  His updated medication list for this problem includes:    Lisinopril 20 Mg Tabs (Lisinopril) ..... Once daily  Complete Medication List: 1)  Vicodin  5-500 Mg Tabs (Hydrocodone-acetaminophen) .Marland Kitchen.. 1 every 6 hours as needed pain 2)  Klonopin 1 Mg Tabs (Clonazepam) .... Two times a day 3)  Lisinopril 20 Mg Tabs (Lisinopril) .... Once daily 4)  Prednisone 10 Mg Tabs (Prednisone) .... As directed 5)  Lomotil 2.5-0.025 Mg Tabs (Diphenoxylate-atropine) .... 2 qid ac for diarrhea, decrease  as you improve to 1 qid 6)  Ciprofloxacin Hcl 500 Mg Tabs (Ciprofloxacin hcl) .Marland Kitchen.. 1 bidfor infection 7)  Metronidazole 500 Mg Tabs (Metronidazole) .Marland Kitchen.. 1 now then 1 morn, midafternoon and hs for infection  Patient Instructions: 1)  acute infectious diarrhea 2)  no solids, drink gatorade , any clear liquids, keep weel hydrated 3)  when you are ready to eat start with soups like chicken rice, chicken noodle 4)  if diarhea increase in severity you should go to ER 5)  or call in AM to office if any questions and I will call you 6)  take your vicodin for pain Prescriptions: CIPROFLOXACIN HCL 500 MG TABS (CIPROFLOXACIN HCL) 1 bidfor infection  #20 x 0   Entered and Authorized by:   Judithann Sheen MD   Signed by:   Judithann Sheen MD on 10/19/2009   Method used:   Electronically to        [facility] (retail)       [address]       [place], [place]  [address]       Ph: [phone]       Fax: [phone]   RxID:   [id number] METRONIDAZOLE 500 MG TABS (METRONIDAZOLE) 1 now then 1 morn, midafternoon and hs for infection  #30 x 0   Entered and Authorized by:   Judithann Sheen MD  Signed by:   Judithann Sheen MD on 10/18/2009   Method used:   Electronically to        [facility].* (retail)       [address]       [place], [place]  [address]       Ph: [phone]       Fax: [phone]   RxID:   [id number] CIPROFLOXACIN HCL 500 MG TABS (CIPROFLOXACIN HCL) 1 dis for infection  #20 x 0   Entered and Authorized by:   Judithann Sheen MD    Signed by:   Judithann Sheen MD on 10/18/2009   Method used:   Electronically to        [facility].* (retail)       [address]       [place], [place]  [address]       Ph: [phone]       Fax: [phone]   RxID:   [id number] LOMOTIL 2.5-0.025 MG TABS (DIPHENOXYLATE-ATROPINE) 2 qid ac for diarrhea, decrease  as you improve to 1 qid  #60 x 0   Entered and Authorized by:   Judithann Sheen MD   Signed by:   Judithann Sheen MD on 10/18/2009   Method used:   Print then Give to Patient   RxID:   [id number]   Laboratory Results   Urine Tests  Date/Time Recieved: October 18, 2009 3:51 PM  Date/Time Reported: October 18, 2009 3:51 PM   Routine Urinalysis   Color: yellow Appearance: Hazy Glucose: negative   (Normal Range: Negative) Bilirubin: 3+   (Normal Range: Negative) Ketone: negative   (Normal Range: Negative) Spec. Gravity: 1.025   (Normal Range: 1.003-1.035) Blood: negative   (Normal Range: Negative) pH: 7.0   (Normal Range: 5.0-8.0) Protein: 1+   (Normal Range: Negative) Urobilinogen: 0.2   (Normal Range: 0-1) Nitrite: negative   (Normal Range: Negative) Leukocyte Esterace: negative   (Normal Range: Negative)    Comments: Wynona Canes, CMA  October 18, 2009 3:51 PM

## 2010-05-14 NOTE — Miscellaneous (Signed)
Summary: [facility] Previsit/prep  Clinical Lists Changes  Medications: Added new medication of MOVIPREP 100 GM  SOLR (PEG-KCL-NACL-NASULF-NA ASC-C) As per prep instructions. - Signed Rx of MOVIPREP 100 GM  SOLR (PEG-KCL-NACL-NASULF-NA ASC-C) As per prep instructions.;  #1 x 0;  Signed;  Entered by: Wyona Almas RN;  Authorized by: Rachael Fee MD;  Method used: Electronically to [facility] [address] [place], [place], [place]  [address], Ph: [phone], Fax: [phone] Observations: Added new observation of NKA: T (02/07/2009 10:01)    Prescriptions: MOVIPREP 100 GM  SOLR (PEG-KCL-NACL-NASULF-NA ASC-C) As per prep instructions.  #1 x 0   Entered by:   Wyona Almas RN   Authorized by:   Rachael Fee MD   Signed by:   Wyona Almas RN on 02/07/2009   Method used:   Electronically to        [facility].* (retail)       [address]       [place], [place]  [address]       Ph: [phone]       Fax: [phone]   RxID:   [id number]

## 2010-05-14 NOTE — Assessment & Plan Note (Signed)
Summary: SINUS INFECTION ??//SLM   Vital Signs:  Patient profile:   50 year old male Height:      74 inches Weight:      246 pounds BMI:     31.70 Temp:     97.4 degrees F oral Pulse rate:   79 / minute Pulse rhythm:   regular Resp:     20 per minute BP sitting:   152 / 84  (right arm) Cuff size:   large  Vitals Entered By: Darra Lis RMA (August 02, 2008 4:12 PM) CC: sinus drainage   History of Present Illness: here for 2 reasons today. First he still has pains in the elbows, wrists, and hands. Uses Motrin or Vicodin at times. Also for the past week he has had sinus pressure, PND, ST, and a dry cough. No fever.   Allergies (verified): No Known Drug Allergies  Past History:  Past Medical History:    Reviewed history from 08/30/2007 and no changes required:    ADHD    Hypokalemia    Kidney Stones    thoracic compression fractures, saw Dr. Leslee Home    Current Problems:     SLEEP APNEA (ICD-780.57)    CARPAL TUNNEL SYNDROME (ICD-354.0)    FAMILY HISTORY DIABETES 1ST DEGREE RELATIVE (ICD-V18.0)    HYPERTENSION (ICD-401.9)    HYPERLIPIDEMIA (ICD-272.4)    Allergic Rhinitis  Review of Systems  The patient denies anorexia, fever, weight loss, weight gain, vision loss, decreased hearing, hoarseness, chest pain, syncope, dyspnea on exertion, peripheral edema, hemoptysis, abdominal pain, melena, hematochezia, severe indigestion/heartburn, hematuria, incontinence, genital sores, muscle weakness, suspicious skin lesions, transient blindness, difficulty walking, depression, unusual weight change, abnormal bleeding, enlarged lymph nodes, angioedema, breast masses, and testicular masses.    Physical Exam  General:  Well-developed,well-nourished,in no acute distress; alert,appropriate and cooperative throughout examination Head:  Normocephalic and atraumatic without obvious abnormalities. No apparent alopecia or balding. Eyes:  No corneal or conjunctival inflammation noted.  EOMI. Perrla. Funduscopic exam benign, without hemorrhages, exudates or papilledema. Vision grossly normal. Ears:  External ear exam shows no significant lesions or deformities.  Otoscopic examination reveals clear canals, tympanic membranes are intact bilaterally without bulging, retraction, inflammation or discharge. Hearing is grossly normal bilaterally. Nose:  External nasal examination shows no deformity or inflammation. Nasal mucosa are pink and moist without lesions or exudates. Mouth:  Oral mucosa and oropharynx without lesions or exudates.  Teeth in good repair. Neck:  No deformities, masses, or tenderness noted. Lungs:  Normal respiratory effort, chest expands symmetrically. Lungs are clear to auscultation, no crackles or wheezes. Msk:  No deformity or scoliosis noted of thoracic or lumbar spine.   Extremities:  tender over the MCP joints bilaterally   Impression & Recommendations:  Problem # 1:  DEGENERATIVE JOINT DISEASE (ICD-715.90)  His updated medication list for this problem includes:    Vicodin 5-500 Mg Tabs (Hydrocodone-acetaminophen) .Marland Kitchen... 1 every 6 hours as needed pain    Etodolac 500 Mg Tabs (Etodolac) .Marland Kitchen..Marland Kitchen Two times a day  Problem # 2:  ACUTE SINUSITIS, UNSPECIFIED (ICD-461.9)  His updated medication list for this problem includes:    Zithromax Z-pak 250 Mg Tabs (Azithromycin) .Marland Kitchen... As directed  Complete Medication List: 1)  Ativan 0.5 Mg Tabs (Lorazepam) .... As needed 2)  Vicodin 5-500 Mg Tabs (Hydrocodone-acetaminophen) .Marland Kitchen.. 1 every 6 hours as needed pain 3)  Amlodipine Besylate 5 Mg Tabs (Amlodipine besylate) .... Once daily 4)  Etodolac 500 Mg Tabs (Etodolac) .... Two times a day  5)  Zithromax Z-pak 250 Mg Tabs (Azithromycin) .... As directed  Other Orders: Rapid Strep ([address])  Patient Instructions: 1)  Please schedule a follow-up appointment as needed .  Prescriptions: VICODIN 5-500 MG  TABS (HYDROCODONE-ACETAMINOPHEN) 1 every 6 hours as needed pain   #60 x 3   Entered and Authorized by:   Nelwyn Salisbury MD   Signed by:   Nelwyn Salisbury MD on 08/02/2008   Method used:   Print then Give to Patient   RxID:   [id number] ZITHROMAX Z-PAK 250 MG TABS (AZITHROMYCIN) as directed  #1 x 0   Entered and Authorized by:   Nelwyn Salisbury MD   Signed by:   Nelwyn Salisbury MD on 08/02/2008   Method used:   Print then Give to Patient   RxID:   [id number] ETODOLAC 500 MG TABS (ETODOLAC) two times a day  #60 x 11   Entered and Authorized by:   Nelwyn Salisbury MD   Signed by:   Nelwyn Salisbury MD on 08/02/2008   Method used:   Print then Give to Patient   RxID:   [id number]   Laboratory Results    Other Tests  Rapid Strep: negative Comments: Rita Ohara  August 02, 2008 5:05 PM
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
## 2010-05-14 NOTE — Miscellaneous (Signed)
Summary: Progress Note/[facility]  Progress Note/[facility]   Imported By: Maryln Gottron 11/02/2008 10:29:46  _____________________________________________________________________  External Attachment:    Type:   Image     Comment:   External Document

## 2010-05-14 NOTE — Procedures (Signed)
Summary: Colonoscopy  Patient: [name] Note: All result statuses are Final unless otherwise noted.  Tests: (1) Colonoscopy (COL)   COL Colonoscopy           [facility]      [facility]     [address]     [place], [place]  [address]           COLONOSCOPY PROCEDURE REPORT           PATIENT:  Riley Hartman, Riley Hartman  MR#:  [id number]     BIRTHDATE:  Dec 11, 1960, 48 yrs. old  GENDER:  male           ENDOSCOPIST:  Rachael Fee, MD     Referred by:  Tera Mater Clent Ridges, M.D.           PROCEDURE DATE:  02/21/2009     PROCEDURE:  Colonoscopy with snare polypectomy     ASA CLASS:  Class II     INDICATIONS:  Routine Risk Screening           MEDICATIONS:   Fentanyl 75 mcg IV, Versed 9 mg IV           DESCRIPTION OF PROCEDURE:   After the risks benefits and     alternatives of the procedure were thoroughly explained, informed     consent was obtained.  Digital rectal exam was performed and     revealed no rectal masses.   The LB CF-[id number] endoscope     was introduced through the anus and advanced to the cecum, which     was identified by both the appendix and ileocecal valve, without     limitations.  The quality of the prep was poor, using MoviPrep.     The instrument was then slowly withdrawn as the colon was fully     examined.     <<PROCEDUREIMAGES>>           FINDINGS:  Two small polyps were found, removed with cold snare,     one was retrieved and sent to pathology (jar 1). These were 2-8mm     across, sessile, located in transverse colon and rectum (see     image4 and image6).  This was otherwise a normal examination of     the colon (see image7, image1, and image2).   Retroflexed views in     the rectum revealed no abnormalities.    The scope was then     withdrawn from the patient and the procedure completed.           COMPLICATIONS:  None           ENDOSCOPIC IMPRESSION:     1) Two diminutive polyps, both removed.  One was retrieved and     sent to pathology.  2) Otherwise normal examination           RECOMMENDATIONS:     1) If the polyp removed today is proven to be adenomatous     (pre-cancerous) polyps, you will need a repeat colonoscopy in 5     years. Otherwise you should continue to follow colorectal cancer     screening guidelines for "routine risk" patients with colonoscopy     in 10 years.     2) You will receive a letter within 1-2 weeks with the results     of your biopsy as well as final recommendations. Please call my     office if you have not received a letter after  3 weeks.           REPEAT EXAM:  await pathology           ______________________________     Rachael Fee, MD           n.     eSIGNED:   Rachael Fee at 02/21/2009 11:35 AM           Enid Cutter, [id number]  Note: An exclamation mark (!) indicates a result that was not dispersed into the flowsheet. Document Creation Date: 02/21/2009 11:35 AM _______________________________________________________________________  (1) Order result status: Final Collection or observation date-time: 02/21/2009 11:31 Requested date-time:  Receipt date-time:  Reported date-time:  Referring Physician:   Ordering Physician: Rob Bunting [phone]) Specimen Source:  Source: Launa Grill Order Number: [id number] Lab site:   Appended Document: Colonoscopy     Procedures Next Due Date:    Colonoscopy: 02/2019

## 2010-05-14 NOTE — Assessment & Plan Note (Signed)
Summary: consult for osa   Referred by:  Gershon Crane PCP:  Clent Ridges  Chief Complaint:  Sleep Consult.  History of Present Illness:  the patient is a 50 year old male who been asked to see for possible obstructive sleep apnea.  The patient has been told that he has loud snoring, but no one has mentioned pulses in his breathing during sleep.  He does describe occasional gasping arousals.  Typically get a bed between 10 and 11, and arises at 6:30 a.m. to start his day.  He awakens at least 4 to 5 times a night, and is not rested in the mornings upon arising.  The patient works in [employer], and notes inappropriate sleepiness during the day if he sits for an extended period of time.  He also notes that he will fall asleep with watching television or reading.  He also has some slight pressure with driving home in the evenings.  Of note, the patient's weight is up 20 pounds over the last two years, and his Epworth sleepiness score today in the office is 18     Current Allergies: No known allergies   Past Medical History:    ADHD    Hypokalemia    Kidney Stones    thoracic compression fractures, saw Dr. Leslee Home    Current Problems:     SLEEP APNEA (ICD-780.57)    CARPAL TUNNEL SYNDROME (ICD-354.0)    FAMILY HISTORY DIABETES 1ST DEGREE RELATIVE (ICD-V18.0)    HYPERTENSION (ICD-401.9)    HYPERLIPIDEMIA (ICD-272.4)    Allergic Rhinitis   Family History:    Family History Diabetes 1st degree relative    Family History High cholesterol    Family History Hypertension    Family History of Cardiovascular disorder                    allergies: sister    heart disease: mother, father, maternal grandparents, paternal grandparents    cancer: maternal grandfather (leukemia), paternal grandmother (colon)  Social History:    Occupation:    Married    Former Smoker    Alcohol use-no    Regular exercise-yes    pt has children.   Risk Factors:  Tobacco use:  quit    Year quit:  2002    Pack-years:  1 ppd    Comments:  smoked for approx 20 years  Alcohol use:  no Exercise:  yes   Review of Systems      See HPI   Vital Signs:  Patient Profile:   50 Years Old Male Height:     74 inches Weight:      252.25 pounds O2 Sat:      99 % O2 treatment:    Room Air Temp:     97.7 degrees F oral Pulse rate:   64 / minute BP sitting:   130 / 96  (left arm) Cuff size:   regular  Vitals Entered By: Cyndia Diver LPN (Aug 30, 2007 9:35 AM)             Is Patient Diabetic? No Comments Medications reviewed with patient  Cyndia Diver LPN  Aug 30, 2007 9:43 AM      Physical Exam  General:     obese male in no acute distress Eyes:     PERRLA and EOMI.   Nose:     deviated septum to the left with narrowing Mouth:     moderate elongation of the soft palate and uvula Neck:  supple without JVD, thyromegaly, or lymphadenopathy. Lungs:     totally clear to auscultation Heart:     regular rate and rhythm, no MRG Abdomen:     soft and nontender, bowel sounds present Extremities:     no significant edema, pulses intact distally Neurologic:     alert and oriented, moves all 4 extremities.     Impression & Recommendations:  Problem # 1:  SLEEP APNEA (ICD-780.57) probable obstructive sleep apnea.  The patient gives a history that is very consistent with this problem, is obese, and has abnormal upper airway anatomy.  I have had a long discussion with him about the pathophysiology of sleep apnea, including its impact on his quality of life and cardiovascular health.  I think the patient needs to have a sleep study done for verification, and the patient is agreeable to this approach.  I have also reminded the patient of his moral responsibility to not drive if he is sleepy.   Patient Instructions: 1)  will schedule for sleep study 2)  work on weight loss 3)  will call you to schedule apptm once results are back   ]

## 2010-05-15 ENCOUNTER — Ambulatory Visit: Payer: Self-pay | Admitting: Family Medicine

## 2010-05-16 ENCOUNTER — Telehealth: Payer: Self-pay

## 2010-05-16 NOTE — Assessment & Plan Note (Signed)
Summary: consult re: med and personal issues/anxiety/cjr   Vital Signs:  Patient profile:   50 year old male Weight:      228 pounds O2 Sat:      97 % Temp:     97.6 degrees F Pulse rate:   80 / minute BP sitting:   120 / 82  (left arm) Cuff size:   large  Vitals Entered By: Pura Spice, RN (April 17, 2010 1:01 PM) CC: consultation    History of Present Illness: Here for several issues. First he wants to try Lipitor since we have suggested this for his lipids. Second, he would like a cortisone shot to help the pain in his neck. Third, he wants to try try Ritalin for ADHD. He tried Strattera a few years ago, but this did not help much.   Allergies (verified): No Known Drug Allergies  Past History:  Past Medical History: Reviewed history from 01/11/2010 and no changes required. ADHD Hypokalemia Kidney Stones thoracic compression fractures, saw Dr. Leslee Home SLEEP APNEA (ICD-780.57) CARPAL TUNNEL SYNDROME (ICD-354.0) HYPERTENSION (ICD-401.9) HYPERLIPIDEMIA (ICD-272.4) Allergic Rhinitis neck pain, sees Dr. Sheran Luz, had epidural steroid injections normal nuclear cardiac stress test 01-18-09  Past Surgical History: Reviewed history from 01/11/2010 and no changes required. Arthroscopy L knee - torn cartilage colonoscopy 02-21-09 per Dr. Wendall Papa, benign polyps, repeat in 10 yrs  Review of Systems  The patient denies anorexia, fever, weight loss, weight gain, vision loss, decreased hearing, hoarseness, chest pain, syncope, dyspnea on exertion, peripheral edema, prolonged cough, headaches, hemoptysis, abdominal pain, melena, hematochezia, severe indigestion/heartburn, hematuria, incontinence, genital sores, muscle weakness, suspicious skin lesions, transient blindness, difficulty walking, depression, unusual weight change, abnormal bleeding, enlarged lymph nodes, angioedema, breast masses, and testicular masses.    Physical Exam  General:   Well-developed,well-nourished,in no acute distress; alert,appropriate and cooperative throughout examination Neurologic:  alert & oriented X3, cranial nerves II-XII intact, and gait normal.   Psych:  Cognition and judgment appear intact. Alert and cooperative with normal attention span and concentration. No apparent delusions, illusions, hallucinations   Impression & Recommendations:  Problem # 1:  ADHD (ICD-314.01)  Problem # 2:  NECK SPRAIN AND STRAIN (ICD-847.0)  His updated medication list for this problem includes:    Vicodin 5-500 Mg Tabs (Hydrocodone-acetaminophen) .Marland Kitchen... 1 every 6 hours as needed pain    Etodolac 500 Mg Tabs (Etodolac) ..... One by mouth two times a day as needed  Orders: Depo- Medrol 80mg  (J1040) Admin of Therapeutic Inj  intramuscular or subcutaneous (09811)  Problem # 3:  HYPERLIPIDEMIA (ICD-272.4)  His updated medication list for this problem includes:    Lipitor 20 Mg Tabs (Atorvastatin calcium) ..... Once daily  Complete Medication List: 1)  Vicodin 5-500 Mg Tabs (Hydrocodone-acetaminophen) .Marland Kitchen.. 1 every 6 hours as needed pain 2)  Klonopin 1 Mg Tabs (Clonazepam) .... Two times a day 3)  Lisinopril 20 Mg Tabs (Lisinopril) .... Once daily 4)  Prednisone 10 Mg Tabs (Prednisone) .... As directed 5)  Lomotil 2.5-0.025 Mg Tabs (Diphenoxylate-atropine) .... 2 qid ac for diarrhea, decrease  as you improve to 1 qid 6)  Etodolac 500 Mg Tabs (Etodolac) .... One by mouth two times a day as needed 7)  Ritalin La 20 Mg Xr24h-cap (Methylphenidate hcl) .... Once daily 8)  Lipitor 20 Mg Tabs (Atorvastatin calcium) .... Once daily  Other Orders: Depo- Medrol 40mg  (J1030)  Patient Instructions: 1)  Please schedule a follow-up appointment in 1 month.  Prescriptions: LIPITOR 20 MG  TABS (ATORVASTATIN CALCIUM) once daily  #30 x 11   Entered and Authorized by:   Nelwyn Salisbury MD   Signed by:   Nelwyn Salisbury MD on 04/17/2010   Method used:   Print then Give to Patient    RxID:   [id number] RITALIN LA 20 MG XR24H-CAP (METHYLPHENIDATE HCL) once daily  #30 x 0   Entered and Authorized by:   Nelwyn Salisbury MD   Signed by:   Nelwyn Salisbury MD on 04/17/2010   Method used:   Print then Give to Patient   RxID:   [id number]    Medication Administration  Injection # 1:    Medication: Depo- Medrol 80mg     Diagnosis: NECK SPRAIN AND STRAIN (ICD-847.0)    Route: IM    Site: RUOQ gluteus    Exp Date: 10/2012    Lot #: [id number]    Mfr: Pharmacia    Patient tolerated injection without complications    Given by: Pura Spice, RN (April 17, 2010 2:58 PM)  Injection # 2:    Medication: Depo- Medrol 40mg     Diagnosis: NECK SPRAIN AND STRAIN (ICD-847.0)    Route: IM    Site: RUOQ gluteus    Exp Date: 10/2012    Lot #: [id number]    Mfr: Pharmacia    Patient tolerated injection without complications    Given by: Pura Spice, RN (April 17, 2010 2:59 PM)  Orders Added: 1)  Est. Patient Level IV [84696] 2)  Depo- Medrol 80mg  [J1040] 3)  Depo- Medrol 40mg  [J1030] 4)  Admin of Therapeutic Inj  intramuscular or subcutaneous [29528]

## 2010-05-16 NOTE — Telephone Encounter (Signed)
Wants rx for ritalin 30mg  . He returned the 20mg   rx  He said both appt for 2-2 and 2-8 got cancelled

## 2010-05-16 NOTE — Progress Notes (Signed)
Summary:   new rx for ritalin   Phone Note From Pharmacy   Caller: [facility]  mike  Summary of Call: mike called to ask if could rewrite rx for ritalin due to cost pls mail if agree to give him rx for ritalin SR 20mg   #30 1 by mouth once daily  mail to  [facility]  : [address] [place] [address] Initial call taken by: Pura Spice, RN,  April 19, 2010 1:25 PM  Follow-up for Phone Call        done Follow-up by: Nelwyn Salisbury MD,  April 19, 2010 2:42 PM  Additional Follow-up for Phone Call Additional follow up Details #1::        rx mailed to [facility] Additional Follow-up by: Pura Spice, RN,  April 19, 2010 4:33 PM    New/Updated Medications: RITALIN SR 20 MG CR-TABS (METHYLPHENIDATE HCL) once daily Prescriptions: RITALIN SR 20 MG CR-TABS (METHYLPHENIDATE HCL) once daily  #30 x 0   Entered and Authorized by:   Nelwyn Salisbury MD   Signed by:   Nelwyn Salisbury MD on 04/19/2010   Method used:   Print then Give to Patient   RxID:   [id number]

## 2010-05-16 NOTE — Letter (Signed)
Summary: Work Dietitian at [employer]  [address]   [place], [place] [address]   Phone: [phone]  Fax: [phone]    Today's Date: March 27, 2010  Name of Patient: Riley Hartman  The above named patient had a medical visit today at:  am / pm.  Please take this into consideration when reviewing the time away from work/school.    Special Instructions:  [  ] None  [  ] To be off the remainder of today, returning to the normal work / school schedule tomorrow.  [  ] To be off until the next scheduled appointment on ______________________.  [  ] Other ___Light duty until 12/28/2011_____________________________________________________________ ________________________________________________________________________   Sincerely yours,     Evelena Peat, MD

## 2010-05-17 ENCOUNTER — Telehealth: Payer: Self-pay | Admitting: Family Medicine

## 2010-05-17 ENCOUNTER — Other Ambulatory Visit: Payer: Self-pay | Admitting: Family Medicine

## 2010-05-17 DIAGNOSIS — F909 Attention-deficit hyperactivity disorder, unspecified type: Secondary | ICD-10-CM

## 2010-05-17 MED ORDER — METHYLPHENIDATE HCL ER (LA) 30 MG PO CP24: 30.0000 mg | ORAL_CAPSULE | ORAL | Status: DC

## 2010-05-17 NOTE — Telephone Encounter (Signed)
Pt would like new rx for methylin er 20mg  #30  due to cost

## 2010-05-17 NOTE — Telephone Encounter (Signed)
We did this already

## 2010-05-17 NOTE — Telephone Encounter (Signed)
Ready to pick up.  

## 2010-05-17 NOTE — Telephone Encounter (Signed)
Pt called rx ready for pick up

## 2010-05-20 ENCOUNTER — Other Ambulatory Visit: Payer: Self-pay | Admitting: Family Medicine

## 2010-05-20 DIAGNOSIS — F909 Attention-deficit hyperactivity disorder, unspecified type: Secondary | ICD-10-CM

## 2010-05-20 MED ORDER — METHYLPHENIDATE HCL 20 MG PO TBCR: 20.0000 mg | EXTENDED_RELEASE_TABLET | ORAL | Status: DC

## 2010-05-20 NOTE — Telephone Encounter (Signed)
Per gina pt was noticed

## 2010-05-22 ENCOUNTER — Ambulatory Visit: Payer: Self-pay | Admitting: Family Medicine

## 2010-05-22 NOTE — Progress Notes (Signed)
Summary: REFILL REQUEST  Phone Note Refill Request Message from:  Patient on May 13, 2010 10:47 AM  Refills Requested: Medication #1:  RITALIN LA 20 MG XR24H-CAP once daily   Notes: Pt can be reached at [phone] when Rx is ready for p/u.    Initial call taken by: Debbra Riding,  May 13, 2010 10:49 AM  Follow-up for Phone Call        spoke with pt and stated was resch to feb 8 by office staff  Follow-up by: Pura Spice, RN,  May 13, 2010 3:59 PM  Additional Follow-up for Phone Call Additional follow up Details #1::        done Additional Follow-up by: Nelwyn Salisbury MD,  May 13, 2010 4:26 PM    Additional Follow-up for Phone Call Additional follow up Details #2::    pt aware.  Follow-up by: Pura Spice, RN,  May 13, 2010 4:42 PM  Prescriptions: RITALIN SR 20 MG CR-TABS (METHYLPHENIDATE HCL) once daily  #30 x 0   Entered and Authorized by:   Nelwyn Salisbury MD   Signed by:   Nelwyn Salisbury MD on 05/13/2010   Method used:   Print then Give to Patient   RxID:   [id number]

## 2010-06-13 ENCOUNTER — Encounter: Payer: Self-pay | Admitting: Family Medicine

## 2010-06-13 ENCOUNTER — Ambulatory Visit (INDEPENDENT_AMBULATORY_CARE_PROVIDER_SITE_OTHER): Payer: Self-pay | Admitting: Family Medicine

## 2010-06-13 VITALS — BP 120/82 | HR 74 | Temp 97.9°F | Ht 73.0 in | Wt 238.0 lb

## 2010-06-13 DIAGNOSIS — I10 Essential (primary) hypertension: Secondary | ICD-10-CM

## 2010-06-13 DIAGNOSIS — E785 Hyperlipidemia, unspecified: Secondary | ICD-10-CM

## 2010-06-13 DIAGNOSIS — F909 Attention-deficit hyperactivity disorder, unspecified type: Secondary | ICD-10-CM

## 2010-06-13 LAB — LIPID PANEL
Cholesterol: 142 mg/dL (ref 0–200)
HDL: 35.6 mg/dL — ABNORMAL LOW (ref >39.00)
LDL Cholesterol: 84 mg/dL (ref 0–99)
Total CHOL/HDL Ratio: 4
Triglycerides: 113 mg/dL (ref 0.0–149.0)
VLDL: 22.6 mg/dL (ref 0.0–40.0)

## 2010-06-13 LAB — HEPATIC FUNCTION PANEL
ALT: 33 U/L (ref 0–53)
AST: 23 U/L (ref 0–37)
Albumin: 4.2 g/dL (ref 3.5–5.2)
Alkaline Phosphatase: 44 U/L (ref 39–117)
Bilirubin, Direct: 0.1 mg/dL (ref 0.0–0.3)
Total Bilirubin: 0.7 mg/dL (ref 0.3–1.2)
Total Protein: 6.5 g/dL (ref 6.0–8.3)

## 2010-06-13 MED ORDER — METHYLPHENIDATE HCL 20 MG PO TBCR: 20.0000 mg | EXTENDED_RELEASE_TABLET | ORAL | Status: DC

## 2010-06-13 NOTE — Progress Notes (Signed)
  Subjective:    Patient ID: Riley Hartman, male    DOB: 12/14/1960, 50 y.o.   MRN: [id number]  HPI Here to follow up on lipids, HTN, and ADHD. His BP has been stable and he feels good. His chronic neck pain is tolerable. He is very pleased with the [facility], and he wants to stay on this. He can concentrate better at work, and he has less frustration when he gets behind. No side effects to report.    Review of Systems  Constitutional: Negative.   Respiratory: Negative.   Cardiovascular: Negative.   Neurological: Negative.   Psychiatric/Behavioral: Negative.        Objective:   Physical Exam  Constitutional: He is oriented to person, place, and time. He appears well-developed and well-nourished.  Cardiovascular: Normal rate, regular rhythm, normal heart sounds and intact distal pulses.   Pulmonary/Chest: Effort normal and breath sounds normal.  Neurological: He is alert and oriented to person, place, and time.  Psychiatric: He has a normal mood and affect. His behavior is normal. Judgment and thought content normal.          Assessment & Plan:  He is doing well and we will continue the current meds. Get fasting labs

## 2010-06-14 NOTE — Progress Notes (Signed)
None of pt/'s phone numbers are active.

## 2010-06-21 ENCOUNTER — Telehealth: Payer: Self-pay | Admitting: Family Medicine

## 2010-06-21 ENCOUNTER — Encounter: Payer: Self-pay | Admitting: Family Medicine

## 2010-06-21 ENCOUNTER — Ambulatory Visit (INDEPENDENT_AMBULATORY_CARE_PROVIDER_SITE_OTHER): Payer: BC Managed Care – PPO | Admitting: Family Medicine

## 2010-06-21 VITALS — BP 110/80 | Temp 97.8°F | Ht 73.0 in | Wt 230.0 lb

## 2010-06-21 DIAGNOSIS — J329 Chronic sinusitis, unspecified: Secondary | ICD-10-CM

## 2010-06-21 MED ORDER — AZITHROMYCIN 250 MG PO TABS: ORAL_TABLET | ORAL | Status: AC

## 2010-06-21 MED ORDER — AZITHROMYCIN 250 MG PO TABS: ORAL_TABLET | ORAL | Status: DC

## 2010-06-21 NOTE — Telephone Encounter (Signed)
Pt said that [facility] still has not req the zpak script. Pls send asap today. Pt would like to be called when this has been taken care of.

## 2010-06-21 NOTE — Progress Notes (Signed)
  Subjective:    Patient ID: Riley Hartman, male    DOB: 10/02/1960, 50 y.o.   MRN: [id number]  HPI Here for 2 weeks of sinus pressure, HA, PND, and ST. No cough or fever.    Review of Systems  Constitutional: Negative.   HENT: Positive for congestion and sinus pressure.   Eyes: Negative.   Respiratory: Negative.        Objective:   Physical Exam  Constitutional: He appears well-developed and well-nourished.  HENT:  Head: Normocephalic and atraumatic.  Right Ear: External ear normal.  Left Ear: External ear normal.  Nose: Nose normal.  Mouth/Throat: Oropharynx is clear and moist. No oropharyngeal exudate.          Assessment & Plan:  Rest, fluids

## 2010-06-21 NOTE — Telephone Encounter (Signed)
Pt called to ck on status of Rx for z-pak that was sent into [facility] this am.... Pt states they have not recv''d anything......[phone].

## 2010-08-30 NOTE — Consult Note (Signed)
NAME:  Riley Hartman, Riley Hartman                          ACCOUNT NO.:  [id number]   MEDICAL RECORD NO.:  [id number]                   PATIENT TYPE:  INP   LOCATION:  1823                                 FACILITY:  [facility]   PHYSICIAN:  Thomas C. Wall, M.D. [facility]            DATE OF BIRTH:  [birth year]   DATE OF CONSULTATION:  04/27/2002  DATE OF DISCHARGE:                                   CONSULTATION   REASON FOR CONSULTATION:  We were asked by the emergency room at [facility],  Dr. Bethel Born, to evaluate the patient with chest pain.   HISTORY OF PRESENT ILLNESS:  The patient is a 50 year old male with history  of nonobstructive coronary artery disease by catheterization in 1999.  At  that time, he had a 40% LAD, 20% mid LAD, 30% distal LAD, 40% OM, and EF of  55%.  He has risk factors of hyperlipidemia which has not been actively  treated, a family history, and remote tobacco.   He came to the emergency room with pain that started last night.  It was a  dull ache in his right shoulder and down his arm intermittently.  He had no  nausea, vomiting, diaphoresis, or shortness of breath.  Movement of the arm  seemed to make it worse.  He took 2 Aleve but continued to hurt this  morning. He took an aspirin and felt a little better.  He came to the  emergency room.   PAST MEDICAL HISTORY:  He has not seen a physician in greater than a year.  He has had a knee operation and has a history of nephrolithiasis.   ALLERGIES:  No known drug allergies   MEDICATIONS:  Occasional aspirin, no other treatment.   SOCIAL HISTORY:  He lives in [place] with his wife.  He has one son.  He  no longer smokes.  He occasionally drinks alcohol.  He works [employer].   FAMILY HISTORY:  Mother and father both had coronary disease but are alive.  His father had coronary artery bypass surgery at age 41.   REVIEW OF SYSTEMS:  Unremarkable.   PHYSICAL EXAMINATION:  VITAL SIGNS:  Blood pressure today is 165/90,  pulse  72 and regular, temperature 97.3, respiratory rate 22, O2 saturation 98% on  room air.  GENERAL:  Well developed, well nourished, no acute distress.  SKIN:  Warm and dry.  Clear.  HEENT:  Exam unremarkable.  NECK:  No JVD.  Carotid upstrokes equal bilaterally without bruits.  No  thyromegaly.  LUNGS:  Clear to auscultation and percussion.  HEART:  Regular rate and rhythm without gallop.  Normal S1 and S2.  ABDOMEN:  Soft with good bowel sounds.  There is no midline bruit.  There is  no hepatomegaly.  EXTREMITIES:  No cyanosis, clubbing, or edema.  Pulses were brisk  bilaterally.  NEUROLOGIC:  Normal.   LABORATORY DATA:  Chest x-ray  shows no acute cardiopulmonary disease, normal  otherwise.   EKG is completely normal with sinus rhythm at 64.   CPK 97, MB 2.3, troponin 0.01.  Hemoglobin 14.6.  Potassium 4.0, creatinine  0.9.   ASSESSMENT/PLAN:  Chest pain with a history of nonobstructive coronary  artery disease.  His symptoms are somewhat atypical.  Being pretty much  constant for greater than 16 hours without electrocardiographic changes and  negative enzymes makes one think this is noncardiac; however, we need to  take this quite seriously.   PLAN:  1. Begin aspirin 81 mg a day.  2. Exercise stress Cardiolite tomorrow in the office.  This has been     arranged with report sent to me.  3. Check fasting lipids, LFTs tomorrow morning when he comes to the office.                                               Thomas C. Daleen Squibb, M.D. [facility]    TCW/MEDQ  D:  04/27/2002  T:  04/27/2002  Job:  [id number]   cc:   Valera Castle, M.D.  [facility] (for a.m. testing)

## 2010-08-30 NOTE — H&P (Signed)
[place]  Patient:    Riley Hartman, Riley Hartman Visit Number: [id number] MRN: [id number]          Service Type: MED Location: 2000 2001 01 Attending Physician:  Daisey Must Dictated by:   Daisey Must, M.D. [facility] Proc. Date: 03/15/01 Admit Date:  03/15/2001   CC:         Kern Reap, M.D.   History and Physical  HISTORY OF PRESENT ILLNESS: The patient is a 50 year old white male with cardiovascular risk factors of hyperlipidemia, family history of premature coronary artery disease and previous tobacco use. He was admitted to [facility] in February of 1999, at which time he underwent a cardiac catheterization. This showed normal left ventricular systolic function and mild nonobstructive coronary artery disease. He also had a V/Q scan done at that time which showed no evidence of pulmonary embolus. He had done well since then until the day of admission. This morning at approximately 9:30 a.m. he developed intermittent substernal chest pain. This was somewhat recurrent in nature over the next several hours. It was described as a pressure in the center of the chest radiating into the right side of the neck and jaw. It was rated at 6/10 in severity that is worse. He is currently pain-free. There is no associated shortness of breath, nausea, lightheadedness, diaphoresis, or palpitations currently. He states since being in the emergency room, he has not had any recurrent chest discomfort. He does not recall whether this is similar to his chest pain in 1999 prior to his catheterization. Of note, he has had right-sided jaw pain over the past 6-8 weeks which initially was very severe and continuous. He was seen by a dentist and endodontist without a clear diagnosis, although there is a question of lymphadenitis. He had been treated with antibiotics and nonsteroidals. He states this pain is different from his jaw pain.  He otherwise has been free  of chest discomfort until today.  Past medical history, social history, family history, and review of systems are as noted in the physicians assistants note.  PHYSICAL EXAMINATION:  GENERAL: This is a well appearing young man in no acute distress.  VITAL SIGNS: Pulse 69 and regular, blood pressure is 125/74, temperature is 98.3.  SKIN: Warm and dry without generalized rash.  HEENT: Normocephalic, atraumatic. Sclerae anicteric.  Extraocular movements intact. Oral mucosa is unremarkable.  NECK: There is mild right anterior cervical lymphadenopathy. No thyromegaly. Carotid upstroke normal without bruits. No JVD.  CHEST: Clear to percussion and auscultation.  CARDIAC: Apical impulse nondisplaced, regular rate and rhythm, soft S4, normal S1 and S2 without rub, murmur, or S3.  ABDOMEN: Soft, nontender without organomegaly.  EXTREMITIES: No clubbing, cyanosis, or edema. Peripheral pulses are 2+ in the lower extremities.  ECG today shows normal sinus rhythm at a rate of 64.  Normal ECG. Other labs are as noted in the admission note.  ASSESSMENT: The patient is a 50 year old male with cardiovascular risk factors of hyperlipidemia, family history of premature coronary artery disease and previous tobacco abuse. He now presents with chest pain with both typical and atypical chest features. He is pain-free now and has a normal resting ECG. Initial cardiac enzymes unremarkable.  PLAN: 1. The patient will be admitted and treated with aspirin and beta blocker.    He will be ruled out for myocardial infarction. If he has recurrent chest    pain symptoms would consider starting nitroglycerin and heparin. 2. If the patient rules out for  myocardial infarction and does not develop    any ECG changes, a stress Cardiolite will be obtained to rule out ischemia.    If either enzymes are positive, he develops ECG changes or has a positive    Cardiolite, would proceed with cardiac  catheterization. 3. Will check fasting lipids and institute therapy if they are significantly    elevated. Dictated by:   Daisey Must, M.D. [facility] Attending Physician:  Daisey Must DD:  03/15/01 TD:  03/16/01 Job: [id number]

## 2010-08-30 NOTE — Procedures (Signed)
NAMEKANAI, Riley Hartman NO.:  [id number]   MEDICAL RECORD NO.:  [id number]          PATIENT TYPE:  OUT   LOCATION:  SLEEP CENTER                 FACILITY[facility]  [facility]   PHYSICIAN:  Barbaraann Share, MD,FCCPDATE OF BIRTH:  12/03/1958   DATE OF STUDY:                            NOCTURNAL POLYSOMNOGRAM   REFERRING PHYSICIAN:  Barbaraann Share, MD,FCCP   INDICATION FOR STUDY:  Hypersomnia with sleep apnea.   EPWORTH SLEEPINESS SCORE:  19.   SLEEP ARCHITECTURE:  The patient had a total sleep time of 369 minutes  with no slow-wave sleep and decreased REM.  Sleep onset latency was  normal at 14 minutes and REM onset was normal as well.  Sleep efficiency  was fairly good at 92%.   RESPIRATORY DATA:  The patient was found to have 1 obstructive apnea and  18 obstructive hypopneas for an apnea/hypopnea index of 3 events per  hour.  He also had 16 respiratory effort-related arousals giving him a  respiratory disturbance index of 6 events per hour.  The events were not  positional, but were much more prominent during REM.  There was loud  snoring noted throughout.   OXYGEN DATA:  There was O2 desaturation as low at 87% with the patient's  obstructive events.   CARDIAC DATA:  Occasional PAC noted, but no clinically significant  arrhythmia.   MOVEMENT-PARASOMNIA:  No leg jerks or abnormal behaviors were noted.   IMPRESSIONS-RECOMMENDATIONS:  1. Mild obstructive sleep apnea/hypopnea syndrome noted with an      apnea/hypopnea index of 3 events per hour and a respiratory      disturbance index of 6 events per hour.  There was O2 desaturation      as low as 87%.  Given the patient's significant symptoms and high      Epworth score, I would recommend treatment of this mild degree of      sleep apnea.  This can include weight loss alone if applicable,      upper airway surgery, oral appliance and also      CPAP.  Clinical correlation is suggested.  2. Occasional premature atrial  contraction, but no clinically      significant arrhythmia.      Barbaraann Share, MD,FCCP  Diplomate, American Board of Sleep  Medicine  Electronically Signed     [facility]/MEDQ  D:  09/16/2007 09:02:35  T:  09/16/2007 09:55:23  Job:  [id number]

## 2010-08-30 NOTE — Discharge Summary (Signed)
[place]  Patient:    Riley Hartman, Riley Hartman Visit Number: [id number] MRN: [id number]          Service Type: MED Location: 2000 2001 01 Attending Physician:  Daisey Must Dictated by:   Lavella Hammock, P.A. Admit Date:  03/15/2001 Disc. Date: 03/16/01   CC:         Riley Hartman, M.D.   Referring Physician Discharge [place]  DATE OF BIRTH:  [birth year]  PROCEDURES:  Cardiolite.  HOSPITAL COURSE:  Riley Hartman is a 50 year old male with a history of hyperlipidemia and a family history of premature coronary artery disease as well as a history of previous tobacco use.  He was admitted to [facility] on March 15, 2001 for intermittent substernal chest pain.  It radiated to the right side of the neck and the jaw and was described as a 6/10 at its worst.  He was admitted to rule out MI and for further evaluation.  His enzymes were negative for MI and the patient had a stress Cardiolite the next day.  During the Cardiolite he complained of leg weakness and shortness of breath as well as fatigue but had no chest pain.  He exercised a total of 10 minutes and his target heart rate was 151, with him reaching a maximum heart rate of 152.  He had some peak T waves in recovery but otherwise no ischemic changes.  The Cardiolite was evaluated and it showed no ischemia with an EF of 54% and possible diaphragmatic attenuation.  He had no further episodes of chest pain and no shortness of breath.  He was considered stable for discharge on March 16, 2001.  DISCHARGE CONDITION:  Stable.  CONSULTS:  None.  COMPLICATIONS:  None.  DISCHARGE DIAGNOSES: 1. Chest pain, no myocardial infarction by enzymes and no ischemia by    Cardiolite this admission. 2. Minimal coronary artery disease by cardiac catheterization in 1999, showing    30-40% ostial left anterior descending artery, 20% mid left anterior    descending artery, and 20-30% distal left  anterior descending artery.    First obtuse marginal with a 40% stenosis.  Ejection fraction 55%. 3. Hyperlipidemia/hypertriglyceridemia with triglycerides greater than 400 in    1999, diet control. 4. Family history of premature coronary artery disease. 5. A 20 pack-year history of tobacco use, quit September 2002. 6. Status post arthroscopic knee surgery and kidney stone removal. 7. Chronic musculoskeletal pain.  DISCHARGE INSTRUCTIONS:  ACTIVITY:  His activity level is to be as tolerated.  DIET:  He is to stick to a low fat diet.  FOLLOW-UP:  He is to follow up with Dr. Jens Hartman as needed.  He is to follow up with Dr. Barbee Hartman as scheduled.  DISCHARGE MEDICATIONS:  Bextra 10 mg p.o. q.d. Dictated by:   Lavella Hammock, P.A. Attending Physician:  Daisey Must DD:  03/16/01 TD:  03/16/01 Job: [id number]

## 2010-10-01 ENCOUNTER — Ambulatory Visit (INDEPENDENT_AMBULATORY_CARE_PROVIDER_SITE_OTHER): Payer: BC Managed Care – PPO | Admitting: Family Medicine

## 2010-10-01 ENCOUNTER — Encounter: Payer: Self-pay | Admitting: Family Medicine

## 2010-10-01 VITALS — BP 120/80 | Temp 97.9°F | Wt 230.0 lb

## 2010-10-01 DIAGNOSIS — F909 Attention-deficit hyperactivity disorder, unspecified type: Secondary | ICD-10-CM

## 2010-10-01 DIAGNOSIS — M542 Cervicalgia: Secondary | ICD-10-CM

## 2010-10-01 MED ORDER — METHYLPHENIDATE HCL 20 MG PO TBCR: 20.0000 mg | EXTENDED_RELEASE_TABLET | ORAL | Status: DC

## 2010-10-01 MED ORDER — HYDROCODONE-ACETAMINOPHEN 5-500 MG PO TABS: 1.0000 | ORAL_TABLET | Freq: Four times a day (QID) | ORAL | Status: DC | PRN

## 2010-10-01 NOTE — Progress Notes (Signed)
  Subjective:    Patient ID: Riley Hartman, male    DOB: [birth year], 50 y.o.   MRN: [id number]  HPI Here for med refills and for a flare up of his chronic neck pain. 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 He is doing very well with the methylphenidate.    Review of Systems  Constitutional: Negative.   HENT: Positive for neck pain and neck stiffness.   Neurological: Negative.   Psychiatric/Behavioral: Negative.        Objective:   Physical Exam  Constitutional: He appears well-developed and well-nourished.  Musculoskeletal:       Neck is tender with reduced ROM   Psychiatric: He has a normal mood and affect. His behavior is normal. Thought content normal.          Assessment & Plan:  Given a steroid shot. Meds are refilled.

## 2010-11-11 ENCOUNTER — Other Ambulatory Visit: Payer: Self-pay | Admitting: Family Medicine

## 2010-11-11 NOTE — Telephone Encounter (Signed)
Refill request for Prednisone, pt last here on 10/01/10 and script last filled on 08/15/09.

## 2010-11-13 MED ORDER — PREDNISONE (PAK) 10 MG PO TABS: 10.0000 mg | ORAL_TABLET | Freq: Every day | ORAL | Status: AC

## 2010-11-13 NOTE — Telephone Encounter (Signed)
Call in a 12 day taper pack of 10 mg Prednisone

## 2010-11-13 NOTE — Telephone Encounter (Signed)
Script called in

## 2011-01-09 ENCOUNTER — Other Ambulatory Visit: Payer: Self-pay | Admitting: Family Medicine

## 2011-01-09 NOTE — Telephone Encounter (Signed)
Pt req refill for methylphenidate (METADATE ER) 20 MG ER tablet.

## 2011-01-09 NOTE — Telephone Encounter (Signed)
Pt last seen 10/01/10.

## 2011-01-10 MED ORDER — METHYLPHENIDATE HCL 20 MG PO TBCR: 20.0000 mg | EXTENDED_RELEASE_TABLET | ORAL | Status: DC

## 2011-01-10 NOTE — Telephone Encounter (Signed)
Scripts are ready for pick up and pt aware.

## 2011-01-10 NOTE — Telephone Encounter (Signed)
done

## 2011-04-09 ENCOUNTER — Telehealth: Payer: Self-pay | Admitting: Speech Pathology

## 2011-04-09 NOTE — Telephone Encounter (Signed)
Pt needs a refill on Prednisone 10mg . Call in to [facility] [phone]

## 2011-04-10 NOTE — Telephone Encounter (Signed)
Pt called again and requesting you contact him. He is unable to work without medication and this will be his 2nd day out of work and needs a Dr. Phoebe Sharps. Pt requesting you contact him

## 2011-04-10 NOTE — Telephone Encounter (Signed)
Left voice message, MD is out of office and will not be back until 04/14/11. I will route the message and see what the response is.

## 2011-04-11 ENCOUNTER — Ambulatory Visit: Payer: BC Managed Care – PPO | Admitting: Family Medicine

## 2011-04-14 MED ORDER — PREDNISONE 10 MG PO TABS: 10.0000 mg | ORAL_TABLET | Freq: Every day | ORAL | Status: DC

## 2011-04-14 NOTE — Telephone Encounter (Signed)
Script called in and pt aware. 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

## 2011-04-14 NOTE — Telephone Encounter (Signed)
Okay for the work note. Call in Prednisone 10 mg a day for 6 months

## 2011-04-15 ENCOUNTER — Emergency Department (HOSPITAL_COMMUNITY): Payer: BC Managed Care – PPO

## 2011-04-15 ENCOUNTER — Encounter (HOSPITAL_COMMUNITY): Payer: Self-pay | Admitting: Emergency Medicine

## 2011-04-15 ENCOUNTER — Observation Stay (HOSPITAL_COMMUNITY)
Admission: EM | Admit: 2011-04-15 | Discharge: 2011-04-18 | Disposition: A | Discharge status: Home / Self Care | Payer: BC Managed Care – PPO | Source: Ambulatory Visit | Attending: Internal Medicine | Admitting: Internal Medicine

## 2011-04-15 ENCOUNTER — Other Ambulatory Visit: Payer: Self-pay

## 2011-04-15 DIAGNOSIS — J019 Acute sinusitis, unspecified: Secondary | ICD-10-CM

## 2011-04-15 DIAGNOSIS — D696 Thrombocytopenia, unspecified: Secondary | ICD-10-CM | POA: Insufficient documentation

## 2011-04-15 DIAGNOSIS — I10 Essential (primary) hypertension: Secondary | ICD-10-CM | POA: Insufficient documentation

## 2011-04-15 DIAGNOSIS — I2584 Coronary atherosclerosis due to calcified coronary lesion: Secondary | ICD-10-CM | POA: Insufficient documentation

## 2011-04-15 DIAGNOSIS — R509 Fever, unspecified: Secondary | ICD-10-CM | POA: Insufficient documentation

## 2011-04-15 DIAGNOSIS — Z8249 Family history of ischemic heart disease and other diseases of the circulatory system: Secondary | ICD-10-CM | POA: Insufficient documentation

## 2011-04-15 DIAGNOSIS — I2510 Atherosclerotic heart disease of native coronary artery without angina pectoris: Secondary | ICD-10-CM | POA: Insufficient documentation

## 2011-04-15 DIAGNOSIS — E785 Hyperlipidemia, unspecified: Secondary | ICD-10-CM | POA: Insufficient documentation

## 2011-04-15 DIAGNOSIS — J029 Acute pharyngitis, unspecified: Secondary | ICD-10-CM | POA: Insufficient documentation

## 2011-04-15 DIAGNOSIS — R079 Chest pain, unspecified: Principal | ICD-10-CM | POA: Insufficient documentation

## 2011-04-15 HISTORY — DX: Atherosclerotic heart disease of native coronary artery without angina pectoris: I25.10

## 2011-04-15 HISTORY — DX: Unspecified right bundle-branch block: I45.10

## 2011-04-15 LAB — BASIC METABOLIC PANEL
BUN: 15 mg/dL (ref 6–23)
CO2: 29 mEq/L (ref 19–32)
Calcium: 9.9 mg/dL (ref 8.4–10.5)
Chloride: 98 mEq/L (ref 96–112)
Creatinine, Ser: 0.85 mg/dL (ref 0.50–1.35)
GFR calc Af Amer: >90 mL/min (ref >90)
GFR calc non Af Amer: >90 mL/min (ref >90)
Glucose, Bld: 92 mg/dL (ref 70–99)
Potassium: 4.6 mEq/L (ref 3.5–5.1)
Sodium: 136 mEq/L (ref 135–145)

## 2011-04-15 LAB — CBC
HCT: 45.3 % (ref 39.0–52.0)
Hemoglobin: 15.4 g/dL (ref 13.0–17.0)
MCH: 29.8 pg (ref 26.0–34.0)
MCHC: 34 g/dL (ref 30.0–36.0)
MCV: 87.6 fL (ref 78.0–100.0)
Platelets: 134 10*3/uL — ABNORMAL LOW (ref 150–400)
RBC: 5.17 MIL/uL (ref 4.22–5.81)
RDW: 13.1 % (ref 11.5–15.5)
WBC: 9.9 10*3/uL (ref 4.0–10.5)

## 2011-04-15 LAB — POCT I-STAT TROPONIN I: Troponin i, poc: 0 ng/mL (ref 0.00–0.08)

## 2011-04-15 MED ORDER — ASPIRIN 81 MG PO CHEW
324.0000 mg | CHEWABLE_TABLET | Freq: Once | ORAL | Status: AC
  Administered 2011-04-15: 324 mg via ORAL
  Filled 2011-04-15: qty 4

## 2011-04-15 NOTE — ED Provider Notes (Signed)
10:58 PM  Date: 04/15/2011  Rate: 93  Rhythm: normal sinus rhythm and premature ventricular contractions (PVC)  QRS Axis: normal  Intervals: normal  ST/T Wave abnormalities: normal  Conduction Disutrbances:right bundle branch block  Narrative Interpretation: Abnormal EKG.   Old EKG Reviewed: changes noted--was normal sinus rhythm on October 29, 2002.       Carleene Cooper III, MD 04/15/11 2300
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
## 2011-04-15 NOTE — ED Notes (Signed)
PT. REPORTS RIGHT CHEST PAIN ONSET TODAY WITH SLIGHT SOB AND DRY COUGH / SORE THROAT ,  NAUSEA ,  DENIES DIAPHORESIS, .

## 2011-04-16 ENCOUNTER — Encounter (HOSPITAL_COMMUNITY): Payer: Self-pay | Admitting: Physician Assistant

## 2011-04-16 ENCOUNTER — Other Ambulatory Visit: Payer: Self-pay

## 2011-04-16 ENCOUNTER — Observation Stay (HOSPITAL_COMMUNITY): Payer: BC Managed Care – PPO

## 2011-04-16 DIAGNOSIS — R079 Chest pain, unspecified: Secondary | ICD-10-CM

## 2011-04-16 LAB — CARDIAC PANEL(CRET KIN+CKTOT+MB+TROPI)
CK, MB: 2 ng/mL (ref 0.3–4.0)
Relative Index: INVALID (ref 0.0–2.5)
Total CK: 55 U/L (ref 7–232)
Troponin I: <0.3 ng/mL (ref <0.30)

## 2011-04-16 LAB — CBC
HCT: 42.5 % (ref 39.0–52.0)
Hemoglobin: 14.4 g/dL (ref 13.0–17.0)
MCH: 29.3 pg (ref 26.0–34.0)
MCHC: 33.9 g/dL (ref 30.0–36.0)
MCV: 86.6 fL (ref 78.0–100.0)
Platelets: 141 10*3/uL — ABNORMAL LOW (ref 150–400)
RBC: 4.91 MIL/uL (ref 4.22–5.81)
RDW: 13.3 % (ref 11.5–15.5)
WBC: 8.5 10*3/uL (ref 4.0–10.5)

## 2011-04-16 LAB — PROTIME-INR
INR: 1.05 (ref 0.00–1.49)
Prothrombin Time: 13.9 seconds (ref 11.6–15.2)

## 2011-04-16 LAB — HEPATIC FUNCTION PANEL
ALT: 25 U/L (ref 0–53)
AST: 21 U/L (ref 0–37)
Albumin: 3.8 g/dL (ref 3.5–5.2)
Alkaline Phosphatase: 59 U/L (ref 39–117)
Bilirubin, Direct: 0.1 mg/dL (ref 0.0–0.3)
Indirect Bilirubin: 0.4 mg/dL (ref 0.3–0.9)
Total Bilirubin: 0.5 mg/dL (ref 0.3–1.2)
Total Protein: 7.6 g/dL (ref 6.0–8.3)

## 2011-04-16 LAB — TSH: TSH: 1.661 u[IU]/mL (ref 0.350–4.500)

## 2011-04-16 LAB — POCT I-STAT TROPONIN I: Troponin i, poc: 0 ng/mL (ref 0.00–0.08)

## 2011-04-16 LAB — CREATININE, SERUM
Creatinine, Ser: 0.78 mg/dL (ref 0.50–1.35)
GFR calc Af Amer: >90 mL/min (ref >90)
GFR calc non Af Amer: >90 mL/min (ref >90)

## 2011-04-16 LAB — APTT: aPTT: 29 seconds (ref 24–37)

## 2011-04-16 MED ORDER — SODIUM CHLORIDE 0.9 % IJ SOLN
3.0000 mL | INTRAMUSCULAR | Status: DC | PRN
  Administered 2011-04-18: 3 mL via INTRAVENOUS

## 2011-04-16 MED ORDER — METHYLPHENIDATE HCL 10 MG PO TABS
10.0000 mg | ORAL_TABLET | Freq: Two times a day (BID) | ORAL | Status: DC
  Administered 2011-04-16: 10 mg via ORAL
  Filled 2011-04-16: qty 2

## 2011-04-16 MED ORDER — ACETAMINOPHEN 325 MG PO TABS
ORAL_TABLET | ORAL | Status: AC
  Filled 2011-04-16: qty 2

## 2011-04-16 MED ORDER — ASPIRIN 81 MG PO CHEW
324.0000 mg | CHEWABLE_TABLET | ORAL | Status: AC
  Administered 2011-04-17: 324 mg via ORAL
  Filled 2011-04-16: qty 4

## 2011-04-16 MED ORDER — METOPROLOL TARTRATE 25 MG PO TABS
ORAL_TABLET | ORAL | Status: AC
  Filled 2011-04-16: qty 2

## 2011-04-16 MED ORDER — SODIUM CHLORIDE 0.9 % IV SOLN: 250.0000 mL | INTRAVENOUS | Status: DC | PRN

## 2011-04-16 MED ORDER — ASPIRIN EC 81 MG PO TBEC
81.0000 mg | DELAYED_RELEASE_TABLET | Freq: Every day | ORAL | Status: DC
  Administered 2011-04-18: 81 mg via ORAL
  Filled 2011-04-16: qty 1

## 2011-04-16 MED ORDER — METOPROLOL TARTRATE 1 MG/ML IV SOLN
INTRAVENOUS | Status: AC
  Filled 2011-04-16: qty 15

## 2011-04-16 MED ORDER — NITROGLYCERIN 0.4 MG SL SUBL: 0.4000 mg | SUBLINGUAL_TABLET | SUBLINGUAL | Status: DC | PRN

## 2011-04-16 MED ORDER — ASPIRIN 81 MG PO CHEW
324.0000 mg | CHEWABLE_TABLET | ORAL | Status: AC
  Administered 2011-04-16: 324 mg via ORAL
  Filled 2011-04-16: qty 4

## 2011-04-16 MED ORDER — HEPARIN SODIUM (PORCINE) 5000 UNIT/ML IJ SOLN
5000.0000 [IU] | Freq: Three times a day (TID) | INTRAMUSCULAR | Status: DC
  Administered 2011-04-16 – 2011-04-17 (×3): 5000 [IU] via SUBCUTANEOUS
  Filled 2011-04-16 (×7): qty 1

## 2011-04-16 MED ORDER — METOPROLOL TARTRATE 1 MG/ML IV SOLN
5.0000 mg | Freq: Once | INTRAVENOUS | Status: AC
  Administered 2011-04-16: 5 mg via INTRAVENOUS

## 2011-04-16 MED ORDER — ONDANSETRON HCL 4 MG/2ML IJ SOLN: 4.0000 mg | Freq: Four times a day (QID) | INTRAMUSCULAR | Status: DC | PRN

## 2011-04-16 MED ORDER — NITROGLYCERIN 0.4 MG SL SUBL
SUBLINGUAL_TABLET | SUBLINGUAL | Status: AC
  Filled 2011-04-16: qty 25

## 2011-04-16 MED ORDER — SODIUM CHLORIDE 0.9 % IV BOLUS (SEPSIS)
1000.0000 mL | Freq: Once | INTRAVENOUS | Status: AC
  Administered 2011-04-16: 1000 mL via INTRAVENOUS

## 2011-04-16 MED ORDER — ROSUVASTATIN CALCIUM 40 MG PO TABS
40.0000 mg | ORAL_TABLET | Freq: Every day | ORAL | Status: DC
  Administered 2011-04-16 – 2011-04-17 (×2): 40 mg via ORAL
  Filled 2011-04-16 (×4): qty 1

## 2011-04-16 MED ORDER — METHYLPHENIDATE HCL 20 MG PO TABS: 20.0000 mg | ORAL_TABLET | Freq: Every day | ORAL | Status: DC

## 2011-04-16 MED ORDER — METOPROLOL TARTRATE 25 MG PO TABS: 50.0000 mg | ORAL_TABLET | Freq: Once | ORAL | Status: DC

## 2011-04-16 MED ORDER — NITROGLYCERIN 0.4 MG SL SUBL
0.4000 mg | SUBLINGUAL_TABLET | Freq: Once | SUBLINGUAL | Status: AC
  Administered 2011-04-16: 0.4 mg via SUBLINGUAL

## 2011-04-16 MED ORDER — METOPROLOL TARTRATE 25 MG PO TABS
50.0000 mg | ORAL_TABLET | Freq: Once | ORAL | Status: AC
  Administered 2011-04-16: 50 mg via ORAL

## 2011-04-16 MED ORDER — ACETAMINOPHEN 325 MG PO TABS: 650.0000 mg | ORAL_TABLET | ORAL | Status: DC | PRN

## 2011-04-16 MED ORDER — SODIUM CHLORIDE 0.9 % IV SOLN
INTRAVENOUS | Status: DC
  Administered 2011-04-17: 04:00:00 via INTRAVENOUS

## 2011-04-16 MED ORDER — METOPROLOL TARTRATE 25 MG PO TABS
100.0000 mg | ORAL_TABLET | Freq: Once | ORAL | Status: AC
  Administered 2011-04-16: 100 mg via ORAL
  Filled 2011-04-16: qty 4

## 2011-04-16 MED ORDER — ACETAMINOPHEN 325 MG PO TABS
650.0000 mg | ORAL_TABLET | Freq: Once | ORAL | Status: AC
  Administered 2011-04-16: 650 mg via ORAL

## 2011-04-16 MED ORDER — HYDROCODONE-ACETAMINOPHEN 5-325 MG PO TABS
1.0000 | ORAL_TABLET | Freq: Four times a day (QID) | ORAL | Status: DC | PRN
  Administered 2011-04-18: 1 via ORAL
  Filled 2011-04-16: qty 1

## 2011-04-16 MED ORDER — LISINOPRIL 20 MG PO TABS
20.0000 mg | ORAL_TABLET | Freq: Every day | ORAL | Status: DC
  Administered 2011-04-16 – 2011-04-18 (×3): 20 mg via ORAL
  Filled 2011-04-16 (×3): qty 1

## 2011-04-16 MED ORDER — ZOLPIDEM TARTRATE 5 MG PO TABS: 10.0000 mg | ORAL_TABLET | Freq: Every evening | ORAL | Status: DC | PRN

## 2011-04-16 MED ORDER — ALPRAZOLAM 0.25 MG PO TABS: 0.2500 mg | ORAL_TABLET | Freq: Two times a day (BID) | ORAL | Status: DC | PRN

## 2011-04-16 MED ORDER — SODIUM CHLORIDE 0.9 % IJ SOLN
3.0000 mL | Freq: Two times a day (BID) | INTRAMUSCULAR | Status: DC
  Administered 2011-04-16 – 2011-04-18 (×4): 3 mL via INTRAVENOUS

## 2011-04-16 NOTE — ED Notes (Signed)
REPORT CALLED TO Suzette Battiest

## 2011-04-16 NOTE — ED Notes (Addendum)
Pt to ED c/o sternal chest pain and sore throat and chills, which began this am.  Pt originally thought he had heart burn, but the pain increasingly became a pressure.  Pt denies chest pain at this time.  Lung sounds clear bil.  No redness noted to pharanx.

## 2011-04-16 NOTE — ED Notes (Signed)
Pt back in cdu. Made comfortable to await cardiology consult. Denies chest pain.

## 2011-04-16 NOTE — ED Notes (Signed)
CARDIOLOGY PA AT BEDSIDE 

## 2011-04-16 NOTE — ED Notes (Signed)
Called yellow team to move pt to their area to await admit. Spoke with linda. She advises they are full.

## 2011-04-16 NOTE — ED Notes (Signed)
SINUS RHYTHM ON MONITOR. DENIES PAIN AT THIS TIME. WIFE AT BEDSIDE.

## 2011-04-16 NOTE — ED Notes (Signed)
DR Graciela Husbands AT BEDSIDE EVALUATING PT. STILL HAVE NOT GOTTEN PERMISSION FOR PT TO EAT OR DRINK ANYTHING
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
## 2011-04-16 NOTE — ED Notes (Signed)
Patient resting on stretcher, states he was given pain medication earlier and he was able to sleep.Right forearm is swollen with 2 blister areas, small amt. Of drainage from blisters. Wife at bedside.
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
## 2011-04-16 NOTE — ED Notes (Signed)
Patient transported to CT with rn on monitor

## 2011-04-17 ENCOUNTER — Encounter (HOSPITAL_COMMUNITY)
Admission: EM | Disposition: A | Discharge status: Home / Self Care | Payer: Self-pay | Source: Ambulatory Visit | Attending: Internal Medicine

## 2011-04-17 ENCOUNTER — Encounter (HOSPITAL_COMMUNITY): Payer: Self-pay | Admitting: Cardiology

## 2011-04-17 DIAGNOSIS — I2510 Atherosclerotic heart disease of native coronary artery without angina pectoris: Secondary | ICD-10-CM

## 2011-04-17 HISTORY — PX: LEFT HEART CATHETERIZATION WITH CORONARY ANGIOGRAM: SHX5451

## 2011-04-17 LAB — LIPID PANEL
Cholesterol: 168 mg/dL (ref 0–200)
HDL: 41 mg/dL (ref >39)
LDL Cholesterol: 79 mg/dL (ref 0–99)
Total CHOL/HDL Ratio: 4.1 RATIO
Triglycerides: 242 mg/dL — ABNORMAL HIGH (ref <150)
VLDL: 48 mg/dL — ABNORMAL HIGH (ref 0–40)

## 2011-04-17 LAB — CARDIAC PANEL(CRET KIN+CKTOT+MB+TROPI)
CK, MB: 2.1 ng/mL (ref 0.3–4.0)
Relative Index: INVALID (ref 0.0–2.5)
Total CK: 66 U/L (ref 7–232)
Troponin I: <0.3 ng/mL (ref <0.30)

## 2011-04-17 LAB — CBC
HCT: 44.8 % (ref 39.0–52.0)
Hemoglobin: 15 g/dL (ref 13.0–17.0)
MCH: 29.2 pg (ref 26.0–34.0)
MCHC: 33.5 g/dL (ref 30.0–36.0)
MCV: 87.3 fL (ref 78.0–100.0)
Platelets: 141 10*3/uL — ABNORMAL LOW (ref 150–400)
RBC: 5.13 MIL/uL (ref 4.22–5.81)
RDW: 13.2 % (ref 11.5–15.5)
WBC: 8.3 10*3/uL (ref 4.0–10.5)

## 2011-04-17 LAB — BASIC METABOLIC PANEL
BUN: 16 mg/dL (ref 6–23)
CO2: 27 mEq/L (ref 19–32)
Calcium: 9 mg/dL (ref 8.4–10.5)
Chloride: 99 mEq/L (ref 96–112)
Creatinine, Ser: 0.83 mg/dL (ref 0.50–1.35)
GFR calc Af Amer: >90 mL/min (ref >90)
GFR calc non Af Amer: >90 mL/min (ref >90)
Glucose, Bld: 102 mg/dL — ABNORMAL HIGH (ref 70–99)
Potassium: 4.1 mEq/L (ref 3.5–5.1)
Sodium: 134 mEq/L — ABNORMAL LOW (ref 135–145)

## 2011-04-17 SURGERY — LEFT HEART CATHETERIZATION WITH CORONARY ANGIOGRAM
Anesthesia: LOCAL

## 2011-04-17 MED ORDER — HEPARIN SODIUM (PORCINE) 1000 UNIT/ML IJ SOLN
INTRAMUSCULAR | Status: AC
  Filled 2011-04-17: qty 1

## 2011-04-17 MED ORDER — LIDOCAINE HCL (PF) 1 % IJ SOLN
INTRAMUSCULAR | Status: AC
  Filled 2011-04-17: qty 30

## 2011-04-17 MED ORDER — VERAPAMIL HCL 2.5 MG/ML IV SOLN
INTRAVENOUS | Status: AC
  Filled 2011-04-17: qty 2

## 2011-04-17 MED ORDER — SODIUM CHLORIDE 0.9 % IV SOLN: INTRAVENOUS | Status: AC

## 2011-04-17 MED ORDER — NITROGLYCERIN 0.2 MG/ML ON CALL CATH LAB
INTRAVENOUS | Status: AC
  Filled 2011-04-17: qty 1

## 2011-04-17 MED ORDER — GUAIFENESIN-DM 100-10 MG/5ML PO SYRP
15.0000 mL | ORAL_SOLUTION | ORAL | Status: DC | PRN
  Administered 2011-04-17 – 2011-04-18 (×4): 15 mL via ORAL
  Filled 2011-04-17: qty 15
  Filled 2011-04-17 (×2): qty 5
  Filled 2011-04-17: qty 10
  Filled 2011-04-17: qty 15
  Filled 2011-04-17: qty 5
  Filled 2011-04-17: qty 15

## 2011-04-17 MED ORDER — FENTANYL CITRATE 0.05 MG/ML IJ SOLN
INTRAMUSCULAR | Status: AC
  Filled 2011-04-17: qty 2

## 2011-04-17 MED ORDER — MIDAZOLAM HCL 2 MG/2ML IJ SOLN
INTRAMUSCULAR | Status: AC
  Filled 2011-04-17: qty 2

## 2011-04-17 MED ORDER — HEPARIN (PORCINE) IN NACL 2-0.9 UNIT/ML-% IJ SOLN
INTRAMUSCULAR | Status: AC
  Filled 2011-04-17: qty 2000

## 2011-04-17 NOTE — Progress Notes (Addendum)
Mr. Riley Hartman had previously been complaining of discomfort related to nasal congestion, alongside cough with productive mucous with greenish/yellow sputum. He was given robitussin po liquid 15 ml. That did relieve some discomfort.  He did fall asleep for a short amount of time, but when he became lucid and awake, he was agitated from not being promptly taken down for his cardiac catheterization. His wife was present in the room, and while she visited, apparently the patient complained of pain unaware to the nursing staff. He was administered 5/500-mg tablet of Hydrocodone/Tylenol by his wife from his home medication supply. The Physician's Assistant-C,  Hurman Horn,  was made aware. Patient was monitored before preceding to cath lab.

## 2011-04-17 NOTE — Op Note (Signed)
Cardiac Catheterization Procedure Note  Name: Riley Hartman MRN: [id number] DOB: Jan 22, 1961  Procedure: Left Heart Cath, Selective Coronary Angiography, LV angiography  Indication: Chest pain with abnormal CT   Procedural Details: The right wrist was prepped, draped, and anesthetized with 1% lidocaine. Using the modified Seldinger technique, a 5 French sheath was introduced into the right radial artery. 3 mg of verapamil was administered through the sheath, weight-based unfractionated heparin was administered intravenously. Standard Judkins catheters were used for selective coronary angiography and left ventriculography. Catheter exchanges were performed over an exchange length guidewire. There were no immediate procedural complications. A TR band was used for radial hemostasis at the completion of the procedure.  The patient was transferred to the post catheterization recovery area for further monitoring.  Procedural Findings: Hemodynamics: AO 115/85 (100) LV 115/15 No gradient on pullback  Coronary angiography: Coronary dominance: right There is generalized calcification of the coronary arteries.   Left mainstem: Mild calcification with no significant obstruction  Left anterior descending (LAD): Moderate calcification throughout with proximal and mid luminal irregularities.  There is 40-50% eccentric plaque just proximal to the diagonal.  There is severe diffuse disease at the apical tip vessel. The apical tip vessel extends around the apex, and provides the distal inferior wall.   This is not amenable to PCI, but is diffusely plaqued.    Left circumflex (LCx): The circumflex provides a large OM1, and moderate sized av circ with an OM2 and small PLA.  The OM1 has calcification, but is widely patent out toward the apex with some irregularity at that point.  The AV circ has about 70% narrowing at its takeoff, and there is a mid 50% area of plaquing.  Distally the vessel is patent into the  OM2 and PLA.   Right coronary artery (RCA): Large vessel with mild luminal irregularities throughout.  The PDA and PLA are large, the former bifurcation proximally into two branches.  Other than irregularity, no critical disease  Left ventriculography: Left ventricular systolic function is normal, LVEF is estimated at 55-65%, there is no significant mitral regurgitation   Final Conclusions:   1.  Preserved LV function. 2.  Diffuse coronary calcification and plaque 3.  Discrete abnormalities as noted above  Recommendations:  1.  Aggressive lipid reduction and prevention therapy. 2.  Consider exercise testing with stress echo to assess ischemia.   Shawnie Pons, MD, [facility], FSCAI [place] 4:07 PM 04/17/2011  04/17/2011, 3:58 PM

## 2011-04-17 NOTE — Progress Notes (Signed)
Evaluated Riley Hartman today prior to scheduled cardiac catheterization for complaint of headache, congestion, sore throat and productive cough described as "sinus infection" by patient. He presented to [facility] ED 2 days ago with similar symptoms. Cardiac CT was ordered due to patient's risk factors, CAD history and R sided chest pain on HPI which revealed diffuse calcification too extensive for CTA to be performed. He was subsequently scheduled for cardiac catheterization today to better visualize coronary anatomy. Today, he continues to complain of HA, nasal congestion, sore throat and productive cough consisting of yellow/green sputum. Objectively, he is afebrile without an elevated WBC count. CXR yesterday revealed clear lungs with linear subsegmental atelectasis or scarring in the lingula. O2 sat 93-96% on RA. On physical exam, nares reveal thick white exudate, erythema; oropharynx erythematous. Lungs- normal respiratory effort, CTAB without wheezes, rales or rhonchi. CV- RRR, clear S1, S2 no murmurs, heaves, rubs or gallops. Pt states he has not had flu shot. His symptoms likely support a viral URI. I explained this to the patient. He has significant risk factors in CAD, HTN and HL. CEs negative x 2 since admission. He denies chest pain, shortness of breath, diaphoresis and lightheadedness. He understands and is still agreeable to undergo the procedure.     Jacqulyn Bath, PA-C 04/17/2011 1:19 PM  Patient is set up for cardiac cath today, per Dr. Graciela Husbands.  Indications are abnormal CT with R sided chest pain, and worrisome night time symptoms.  Risks explained to patient who consents to proceed.  Will use radial approach.  He is agreeable and wants to go ahead today.  Symptoms of above are improved.    Shawnie Pons 2:56 PM 04/17/2011

## 2011-04-18 DIAGNOSIS — R079 Chest pain, unspecified: Principal | ICD-10-CM

## 2011-04-18 MED ORDER — NITROGLYCERIN 0.4 MG SL SUBL: 0.4000 mg | SUBLINGUAL_TABLET | SUBLINGUAL | Status: DC | PRN

## 2011-04-18 MED ORDER — AZITHROMYCIN 250 MG PO TABS: ORAL_TABLET | ORAL | Status: AC

## 2011-04-18 MED ORDER — ASPIRIN 81 MG PO TBEC: 81.0000 mg | DELAYED_RELEASE_TABLET | Freq: Every day | ORAL | Status: AC

## 2011-04-18 MED ORDER — DM-GUAIFENESIN ER 30-600 MG PO TB12: 1.0000 | ORAL_TABLET | Freq: Two times a day (BID) | ORAL | Status: AC

## 2011-04-18 MED ORDER — ATORVASTATIN CALCIUM 40 MG PO TABS: 40.0000 mg | ORAL_TABLET | Freq: Every day | ORAL | Status: DC

## 2011-04-18 NOTE — Progress Notes (Signed)
Pt quit 01/2011 and has remained tobacco free. He'd been quit for 15 years before his recent relapse however the cigarettes didn't taste good to him and he subsequently quit smoking again. Congratulated and encouraged pt to remain tobacco free. Referred to 1-800 quit now for f/u and support. Discussed oral fixation substitutes, second hand smoke and in home smoking policy. Reviewed and gave pt Written education/contact information.

## 2011-04-18 NOTE — Discharge Summary (Signed)
Discharge Summary   Patient ID: Riley Hartman,  MRN: [id number], DOB/AGE: 09/12/1960 51 y.o.  Admit date: 04/15/2011 Discharge date: 04/18/2011  Discharge Diagnoses Principal Problem:  *Chest pain Active Problems:  HYPERLIPIDEMIA  HYPERTENSION  Acute sinusitis, unspecified   Allergies No Known Allergies  Procedures  Cardiac Catheterization  Procedural Findings:  Hemodynamics:  AO 115/85 (100)  LV 115/15  No gradient on pullback  Coronary angiography:  Coronary dominance: right  There is generalized calcification of the coronary arteries.  Left mainstem: Mild calcification with no significant obstruction  Left anterior descending (LAD): Moderate calcification throughout with proximal and mid luminal irregularities. There is 40-50% eccentric plaque just proximal to the diagonal. There is severe diffuse disease at the apical tip vessel. The apical tip vessel extends around the apex, and provides the distal inferior wall. This is not amenable to PCI, but is diffusely plaqued.  Left circumflex (LCx): The circumflex provides a large OM1, and moderate sized av circ with an OM2 and small PLA. The OM1 has calcification, but is widely patent out toward the apex with some irregularity at that point. The AV circ has about 70% narrowing at its takeoff, and there is a mid 50% area of plaquing. Distally the vessel is patent into the OM2 and PLA.  Right coronary artery (RCA): Large vessel with mild luminal irregularities throughout. The PDA and PLA are large, the former bifurcation proximally into two branches. Other than irregularity, no critical disease  Left ventriculography: Left ventricular systolic function is normal, LVEF is estimated at 55-65%, there is no significant mitral regurgitation  Final Conclusions:  1. Preserved LV function.  2. Diffuse coronary calcification and plaque  3. Discrete abnormalities as noted above  Recommendations:  1. Aggressive lipid reduction and prevention  therapy.  2. Consider exercise testing with stress echo to assess ischemia.   Cardiac CT  Findings: The coronary artery Agatston calcium score is as follows:  Left main: 23.25  LAD: 666.48  Left circumflex: 173.65  RCA: 232.46  TOTAL: 1095.84  This places the patient in the 99th percentile for age and sex.  IMPRESSION:  1. Agatston coronary calcium score of 1095.84, placing the patient  in the 99th percentile for age and sex.  2. Because of the extensive calcified plaque, coronary CTA was not  performed.   History of Present Illness  [name] is a 51 yo Caucasian male with PMHx significant for nonobstructive CAD (cardiac catheterization in 1999), HTN, HL and previous tobacco abuse who was admitted for [facility] hospital with atypical chest pain.   He presented to [facility] ED with complaints of right-sided chest pain, sore throat, myalgias and chills. He had awoken the day prior with a sore throat and decreased activity level. While at rest, he developed R sided chest pain described as pressure, non-roadiated without associated n/v, sob or diaphoresis. It was constant x 6 hours without aggravating factors. He was febrile at 100.2 upon arrival to the ED. No leukocytosis, TnI neg. EKG revealed old RBBB without acute changes. CXR was unremarkable. He was placed on the CDU chest pain protocol and underwent cardiac CT revealing the above results including high-grade, diffuse calcification of the coronary arteries in which the lumen was unable to be visualized.   Hospital Course   Due to an incomplete CT and strong cardiac risk factors, he was subsequently admitted for atypical chest pain with scheduled cardiac catheterization the following day.   The following day prior to his cardiac catheterization, he complained of  worsening HA, congestion, productive cough consisting of yellow sputum. He was afebrile, without a leukocytosis and CXR was unremarkable from the day prior. He was still agreeable to cath.  He was informed, consented and agreed to the procedure which elicited the above details including discrete coronary calcification and plaque which was nonobstructive, preserved EF. He tolerated this well. The recommendation was made by Dr. Riley Kill to pursue aggressive lipid management and to consider outpatient stress testing for evidence of ischemia.   His CEs remained negative, without acute ischemic changes on EKG. No events on telemetry. Today, he is stable, but still complaining of URI symptoms. He mentioned that he experiences sinusitis almost annually, which is typically relieved by a Z-Pak. He will be discharged with this and Mucinex. REVERSAL and ASTEROID trials were covered by Dr. Riley Kill with the patient as a basis for aggressive lipid management with a recommendation of having LDL particles drawn. He will be discharged on Lipitor 40mg . He is also on methylphenidate, and there is a concern of this causing tachycardia. He was advised to follow-up with his PCP regarding this. He will follow-up with Dr. Riley Kill and have follow-up labs soon.   Discharge Vitals:  Blood pressure 119/77, pulse 86, temperature 98 F (36.7 C), temperature source Oral, resp. rate 20, height 6\' 1"  (1.854 m), weight 105 kg (231 lb 7.7 oz), SpO2 98.00%.   Weight change: 0.673 kg (1 lb 7.7 oz)  Labs: Recent Labs  [facility] 04/17/11 0315 04/16/11 1541   WBC 8.3 8.5   HGB 15.0 14.4   HCT 44.8 42.5   MCV 87.3 86.6   PLT 141* 141*    Lab 04/17/11 0315 04/16/11 1541 04/15/11 2023  NA 134* -- 136  K 4.1 -- 4.6  CL 99 -- 98  CO2 27 -- 29  BUN 16 -- 15  CREATININE 0.83 0.78 0.85  CALCIUM 9.0 -- 9.9  PROT -- 7.6 --  BILITOT -- 0.5 --  ALKPHOS -- 59 --  ALT -- 25 --  AST -- 21 --  AMYLASE -- -- --  LIPASE -- -- --  GLUCOSE 102* -- 92    Recent Labs  Basename 04/17/11 0315 04/16/11 1541   CKTOTAL 66 55   CKMB 2.1 2.0   CKMBINDEX -- --   TROPONINI <0.30 <0.30    Recent Labs  Basename 04/17/11 0315    CHOL 168   HDL 41   LDLCALC 79   TRIG 242*   CHOLHDL 4.1   LDLDIRECT --    Basename 04/16/11 1541  TSH 1.661  T4TOTAL --  T3FREE --  THYROIDAB --   Disposition: stable, will be discharged with above medications for sinusitis Discharge Orders    Future Appointments: Provider: Department: Dept Phone: Center:   05/14/2011 9:00 AM Beatrice Lecher, PA [facility] [place] [phone] [facility]   05/26/2011 11:00 AM Lorenda Cahill [facility] [phone] [facility]     Follow-up Information    Follow up with [facility] on 05/26/2011. (At 11:00 AM)    Contact information:   [address] [place] [address]       Follow up with Tereso Newcomer, PA on 05/14/2011. (At 9:00 AM. )    Contact information:   [address] [place] [address] [phone]       Follow up with FRY,STEPHEN A in 2 weeks. (For follow-up after this hospitalization. )    Contact information:   [address] [facility]  [place] [address] [phone]         Discharge Medications:  Current Discharge Medication List    START taking these medications   Details  aspirin EC 81 MG EC tablet Take 1 tablet (81 mg total) by mouth daily. Qty: 30 tablet, Refills: 3    azithromycin (ZITHROMAX Z-PAK) 250 MG tablet Day 1: Take 2 tablets today.  Days 2-5: Take 1 tablet daily. Qty: 6 each, Refills: 0    dextromethorphan-guaiFENesin (MUCINEX DM) 30-600 MG per 12 hr tablet Take 1 tablet by mouth every 12 (twelve) hours. Qty: 14 tablet, Refills: 0    nitroGLYCERIN (NITROSTAT) 0.4 MG SL tablet Place 1 tablet (0.4 mg total) under the tongue every 5 (five) minutes as needed for chest pain. Qty: 25 tablet, Refills: 3      CONTINUE these medications which have CHANGED   Details  atorvastatin (LIPITOR) 40 MG tablet Take 1 tablet (40 mg total) by mouth daily. Qty: 30 tablet, Refills: 3      CONTINUE these medications which have  NOT CHANGED   Details  HYDROcodone-acetaminophen (VICODIN) 5-500 MG per tablet Take 1 tablet by mouth every 6 (six) hours as needed. For pain     lisinopril (PRINIVIL,ZESTRIL) 20 MG tablet Take 20 mg by mouth daily.      methylphenidate (RITALIN) 20 MG tablet Take 20 mg by mouth daily.         Outstanding Labs/Studies: None  Duration of Discharge Encounter: 40 minutes including physician time.  Signed, R. Hurman Horn, PA-C 04/18/2011, 11:42 AM

## 2011-04-18 NOTE — Progress Notes (Addendum)
Subjective:  Wants to go home.  Angio results reviewed in detail last pm and today.  Has sinusitis, which he says he gets every year, with greenish discharge and sputum, and for which he always gets a zpack, and it gets better.  He wants this at discharge.  Need for aggressive lipid management reviewed in light of extensive coronary plaque.  REVERSAL paradigm reviewed with patient.  Results of REVERSAL and ASTEROID trials reviewed.  Might also favor check of LDL particles.  Cough currently with sputum production.  Sounds wet.  No rales or fever.   Objective:  Vital Signs in the last 24 hours: Temp:  [97.9 F (36.6 C)-98.7 F (37.1 C)] 98 F (36.7 C) (01/04 0757) Pulse Rate:  [82-97] 86  (01/04 0757) Resp:  [15-22] 20  (01/04 0757) BP: (108-135)/(62-77) 119/77 mmHg (01/04 0757) SpO2:  [93 %-98 %] 98 % (01/04 0757) Weight:  [105 kg (231 lb 7.7 oz)] 231 lb 7.7 oz (105 kg) (01/04 1610)  Intake/Output from previous day: 01/03 0701 - 01/04 0700 In: 1443 [P.O.:240; I.V.:1203] Out: -    Physical Exam: General: Well developed, well nourished, in no acute distress. Head:  Normocephalic and atraumatic. Lungs: Clear to auscultation and percussion. Heart: Normal S1 and S2.  No murmur, rubs or gallops.  Pulses: Pulses normal in all 4 extremities. Extremities: No clubbing or cyanosis. No edema. Neurologic: Alert and oriented x 3.    Lab Results:  Basename 04/17/11 0315 04/16/11 1541  WBC 8.3 8.5  HGB 15.0 14.4  PLT 141* 141*    Basename 04/17/11 0315 04/16/11 1541 04/15/11 2023  NA 134* -- 136  K 4.1 -- 4.6  CL 99 -- 98  CO2 27 -- 29  GLUCOSE 102* -- 92  BUN 16 -- 15  CREATININE 0.83 0.78 --    Basename 04/17/11 0315 04/16/11 1541  TROPONINI <0.30 <0.30   Hepatic Function Panel  Basename 04/16/11 1541  PROT 7.6  ALBUMIN 3.8  AST 21  ALT 25  ALKPHOS 59  BILITOT 0.5  BILIDIR 0.1  IBILI 0.4    Basename 04/17/11 0315  CHOL 168   No results found for this basename:  PROTIME in the last 72 hours  Imaging: No results found.    Assessment/Plan:  1.  Chest pain--abnormal CT.  Atypical.  CAD by cath, but medical treatment.  Extensive coronary CA.  Moderate plaque.  Favor a medical approach.  Currently on ritalin replacement.  She see Dr. Clent Ridges to readdress.  2.  Hyperlipidemia--home on lipitor 40 mg, with follow up in office.  I can see back in follow up in next three weeks. :ipid and liver in six weeks.  3.  Sinusitis---will add Z pack to regimen as it has worked for him in past, and he does clearly have this with cough.   More than thirty minutes spent at discharge.     Shawnie Pons, MD, [facility], FSC[facility] 04/18/2011, 10:21 AM

## 2011-05-05 ENCOUNTER — Other Ambulatory Visit: Payer: Self-pay | Admitting: Family Medicine

## 2011-05-05 MED ORDER — METHYLPHENIDATE HCL 20 MG PO TABS: 20.0000 mg | ORAL_TABLET | Freq: Every day | ORAL | Status: DC

## 2011-05-05 NOTE — Telephone Encounter (Signed)
The rx for Ritalin are in your box. Please call in Vicodin for #60 with 5 rf, to take q 6 hours prn pain

## 2011-05-05 NOTE — Telephone Encounter (Addendum)
Pt need new rx generic ritalin 20 mg. Pt would like refill on hydrocodone  [facility] [address] [phone]

## 2011-05-06 MED ORDER — HYDROCODONE-ACETAMINOPHEN 5-500 MG PO TABS: 1.0000 | ORAL_TABLET | Freq: Four times a day (QID) | ORAL | Status: DC | PRN

## 2011-05-06 NOTE — Telephone Encounter (Signed)
Scripts ready for pick up and also called in the Vicodin, left message for pt.

## 2011-05-13 ENCOUNTER — Encounter: Payer: Self-pay | Admitting: Physician Assistant

## 2011-05-14 ENCOUNTER — Encounter: Payer: Self-pay | Admitting: Physician Assistant

## 2011-05-14 ENCOUNTER — Ambulatory Visit (INDEPENDENT_AMBULATORY_CARE_PROVIDER_SITE_OTHER): Payer: BC Managed Care – PPO | Admitting: Physician Assistant

## 2011-05-14 VITALS — BP 140/80 | HR 73 | Resp 18 | Ht 73.0 in | Wt 239.0 lb

## 2011-05-14 DIAGNOSIS — I10 Essential (primary) hypertension: Secondary | ICD-10-CM

## 2011-05-14 DIAGNOSIS — I2510 Atherosclerotic heart disease of native coronary artery without angina pectoris: Secondary | ICD-10-CM

## 2011-05-14 DIAGNOSIS — E785 Hyperlipidemia, unspecified: Secondary | ICD-10-CM
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
## 2011-05-14 NOTE — Assessment & Plan Note (Signed)
Management by primary care.  Lipids to be drawn in several weeks.  Goal LDL less than 100 (optional < 70).

## 2011-05-14 NOTE — Assessment & Plan Note (Signed)
No angina.  Continue aggressive risk factor modification.  His blood pressure is optimal.  Continue statin therapy.  Continue aspirin.  Followup with Dr.  Shawnie Pons in 6 weeks.

## 2011-05-14 NOTE — Assessment & Plan Note (Signed)
Controlled.  Continue current therapy.  

## 2011-05-14 NOTE — Patient Instructions (Signed)
Your physician recommends that you schedule a follow-up appointment in: 6 WEEKS WITH DR. Riley Kill  ON YOUR NEXT VISIT WITH YOUR PRIMARY CARE PHYSICIAN ON 2/28 PLEASE HAVE THEM FAX OVER THE LAB RESULTS TO DR. Georganna Skeans, PA-C [phone]

## 2011-05-26 ENCOUNTER — Other Ambulatory Visit: Payer: BC Managed Care – PPO | Admitting: *Deleted

## 2011-06-12 ENCOUNTER — Other Ambulatory Visit (INDEPENDENT_AMBULATORY_CARE_PROVIDER_SITE_OTHER): Payer: BC Managed Care – PPO

## 2011-06-12 DIAGNOSIS — Z0000 Encounter for general adult medical examination without abnormal findings: Secondary | ICD-10-CM

## 2011-06-12 LAB — LIPID PANEL
Cholesterol: 114 mg/dL (ref 0–200)
HDL: 38.2 mg/dL — ABNORMAL LOW (ref >39.00)
LDL Cholesterol: 64 mg/dL (ref 0–99)
Total CHOL/HDL Ratio: 3
Triglycerides: 60 mg/dL (ref 0.0–149.0)
VLDL: 12 mg/dL (ref 0.0–40.0)

## 2011-06-12 LAB — TSH: TSH: 1.31 u[IU]/mL (ref 0.35–5.50)

## 2011-06-12 LAB — CBC WITH DIFFERENTIAL/PLATELET
Basophils Absolute: 0 10*3/uL (ref 0.0–0.1)
Basophils Relative: 0.5 % (ref 0.0–3.0)
Eosinophils Absolute: 0.1 10*3/uL (ref 0.0–0.7)
Eosinophils Relative: 1.2 % (ref 0.0–5.0)
HCT: 41.7 % (ref 39.0–52.0)
Hemoglobin: 14 g/dL (ref 13.0–17.0)
Lymphocytes Relative: 27.9 % (ref 12.0–46.0)
Lymphs Abs: 2.3 10*3/uL (ref 0.7–4.0)
MCHC: 33.5 g/dL (ref 30.0–36.0)
MCV: 88.4 fl (ref 78.0–100.0)
Monocytes Absolute: 0.5 10*3/uL (ref 0.1–1.0)
Monocytes Relative: 5.9 % (ref 3.0–12.0)
Neutro Abs: 5.4 10*3/uL (ref 1.4–7.7)
Neutrophils Relative %: 64.5 % (ref 43.0–77.0)
Platelets: 151 10*3/uL (ref 150.0–400.0)
RBC: 4.72 Mil/uL (ref 4.22–5.81)
RDW: 13.9 % (ref 11.5–14.6)
WBC: 8.4 10*3/uL (ref 4.5–10.5)

## 2011-06-12 LAB — POCT URINALYSIS DIPSTICK
Bilirubin, UA: NEGATIVE
Blood, UA: NEGATIVE
Glucose, UA: NEGATIVE
Ketones, UA: NEGATIVE
Leukocytes, UA: NEGATIVE
Nitrite, UA: NEGATIVE
Spec Grav, UA: 1.025
Urobilinogen, UA: 0.2
pH, UA: 6.5

## 2011-06-12 LAB — HEPATIC FUNCTION PANEL
ALT: 24 U/L (ref 0–53)
AST: 21 U/L (ref 0–37)
Albumin: 4.4 g/dL (ref 3.5–5.2)
Alkaline Phosphatase: 44 U/L (ref 39–117)
Bilirubin, Direct: 0.1 mg/dL (ref 0.0–0.3)
Total Bilirubin: 0.6 mg/dL (ref 0.3–1.2)
Total Protein: 6.9 g/dL (ref 6.0–8.3)

## 2011-06-12 LAB — BASIC METABOLIC PANEL
BUN: 24 mg/dL — ABNORMAL HIGH (ref 6–23)
CO2: 29 mEq/L (ref 19–32)
Calcium: 9.3 mg/dL (ref 8.4–10.5)
Chloride: 106 mEq/L (ref 96–112)
Creatinine, Ser: 1 mg/dL (ref 0.4–1.5)
GFR: 79.5 mL/min (ref >60.00)
Glucose, Bld: 95 mg/dL (ref 70–99)
Potassium: 4.1 mEq/L (ref 3.5–5.1)
Sodium: 141 mEq/L (ref 135–145)

## 2011-06-12 LAB — PSA: PSA: 1.48 ng/mL (ref 0.10–4.00)

## 2011-06-13 ENCOUNTER — Other Ambulatory Visit: Payer: Self-pay | Admitting: Family Medicine

## 2011-06-17 NOTE — Progress Notes (Signed)
Quick Note:  Left a message for pt to return call. ______ 

## 2011-06-17 NOTE — Progress Notes (Signed)
Quick Note:  Left voice message ______ 

## 2011-06-19 ENCOUNTER — Ambulatory Visit (INDEPENDENT_AMBULATORY_CARE_PROVIDER_SITE_OTHER): Payer: BC Managed Care – PPO | Admitting: Family Medicine

## 2011-06-19 ENCOUNTER — Encounter: Payer: Self-pay | Admitting: Family Medicine

## 2011-06-19 VITALS — BP 126/88 | HR 94 | Temp 98.2°F | Ht 73.5 in | Wt 232.0 lb

## 2011-06-19 DIAGNOSIS — Z0000 Encounter for general adult medical examination without abnormal findings: Secondary | ICD-10-CM

## 2011-06-19 MED ORDER — CLONAZEPAM 1 MG PO TABS: 1.0000 mg | ORAL_TABLET | Freq: Two times a day (BID) | ORAL | Status: DC | PRN

## 2011-06-19 MED ORDER — PREDNISONE 10 MG PO TABS: 10.0000 mg | ORAL_TABLET | Freq: Every day | ORAL | Status: DC

## 2011-06-19 NOTE — Progress Notes (Signed)
  Subjective:    Patient ID: Riley Hartman, male    DOB: 08-04-1960, 51 y.o.   MRN: [id number]  HPI 51 yr old male for a cpx. He feels well with no concerns.    Review of Systems  Constitutional: Negative.   HENT: Negative.   Eyes: Negative.   Respiratory: Negative.   Cardiovascular: Negative.   Gastrointestinal: Negative.   Genitourinary: Negative.   Musculoskeletal: Negative.   Skin: Negative.   Neurological: Negative.   Hematological: Negative.   Psychiatric/Behavioral: Negative.        Objective:   Physical Exam  Constitutional: He is oriented to person, place, and time. He appears well-developed and well-nourished. No distress.  HENT:  Head: Normocephalic and atraumatic.  Right Ear: External ear normal.  Left Ear: External ear normal.  Nose: Nose normal.  Mouth/Throat: Oropharynx is clear and moist. No oropharyngeal exudate.  Eyes: Conjunctivae and EOM are normal. Pupils are equal, round, and reactive to light. Right eye exhibits no discharge. Left eye exhibits no discharge. No scleral icterus.  Neck: Neck supple. No JVD present. No tracheal deviation present. No thyromegaly present.  Cardiovascular: Normal rate, regular rhythm, normal heart sounds and intact distal pulses.  Exam reveals no gallop and no friction rub.   No murmur heard. Pulmonary/Chest: Effort normal and breath sounds normal. No respiratory distress. He has no wheezes. He has no rales. He exhibits no tenderness.  Abdominal: Soft. Bowel sounds are normal. He exhibits no distension and no mass. There is no tenderness. There is no rebound and no guarding.  Genitourinary: Rectum normal, prostate normal and penis normal. Guaiac negative stool. No penile tenderness.  Musculoskeletal: Normal range of motion. He exhibits no edema and no tenderness.  Lymphadenopathy:    He has no cervical adenopathy.  Neurological: He is alert and oriented to person, place, and time. He has normal reflexes. No cranial nerve  deficit. He exhibits normal muscle tone. Coordination normal.  Skin: Skin is warm and dry. No rash noted. He is not diaphoretic. No erythema. No pallor.  Psychiatric: He has a normal mood and affect. His behavior is normal. Judgment and thought content normal.          Assessment & Plan:  Well exam. Recheck in 6 months

## 2011-07-03 ENCOUNTER — Ambulatory Visit: Payer: BC Managed Care – PPO | Admitting: Cardiology

## 2011-08-07 ENCOUNTER — Telehealth: Payer: Self-pay | Admitting: Family Medicine

## 2011-08-07 MED ORDER — METHYLPHENIDATE HCL 20 MG PO TABS: 20.0000 mg | ORAL_TABLET | Freq: Every day | ORAL | Status: DC

## 2011-08-07 NOTE — Telephone Encounter (Signed)
Given to his wife, Cornelia

## 2011-09-18 ENCOUNTER — Ambulatory Visit (INDEPENDENT_AMBULATORY_CARE_PROVIDER_SITE_OTHER): Payer: BC Managed Care – PPO | Admitting: Family Medicine

## 2011-09-18 ENCOUNTER — Encounter: Payer: Self-pay | Admitting: Family Medicine

## 2011-09-18 VITALS — BP 128/84 | HR 77 | Temp 97.7°F | Wt 218.0 lb

## 2011-09-18 DIAGNOSIS — M542 Cervicalgia: Secondary | ICD-10-CM

## 2011-09-18 DIAGNOSIS — E785 Hyperlipidemia, unspecified: Secondary | ICD-10-CM

## 2011-09-18 LAB — LIPID PANEL
Cholesterol: 140 mg/dL (ref 0–200)
HDL: 41.6 mg/dL (ref >39.00)
LDL Cholesterol: 69 mg/dL (ref 0–99)
Total CHOL/HDL Ratio: 3
Triglycerides: 146 mg/dL (ref 0.0–149.0)
VLDL: 29.2 mg/dL (ref 0.0–40.0)

## 2011-09-18 LAB — HEPATIC FUNCTION PANEL
ALT: 25 U/L (ref 0–53)
AST: 22 U/L (ref 0–37)
Albumin: 4.7 g/dL (ref 3.5–5.2)
Alkaline Phosphatase: 50 U/L (ref 39–117)
Bilirubin, Direct: 0.1 mg/dL (ref 0.0–0.3)
Total Bilirubin: 0.9 mg/dL (ref 0.3–1.2)
Total Protein: 7.6 g/dL (ref 6.0–8.3)

## 2011-09-18 LAB — CK: Total CK: 74 U/L (ref 7–232)

## 2011-09-18 MED ORDER — METHYLPREDNISOLONE ACETATE 80 MG/ML IJ SUSP
160.0000 mg | Freq: Once | INTRAMUSCULAR | Status: AC
  Administered 2011-09-18: 160 mg via INTRAMUSCULAR

## 2011-09-18 MED ORDER — ATORVASTATIN CALCIUM 40 MG PO TABS: 20.0000 mg | ORAL_TABLET | Freq: Every day | ORAL | Status: DC

## 2011-09-18 NOTE — Progress Notes (Signed)
Addended by: Aniceto Boss A on: 09/18/2011 12:05 PM   Modules accepted: Orders

## 2011-09-18 NOTE — Progress Notes (Signed)
  Subjective:    Patient ID: Riley Hartman, male    DOB: 1960/09/06, 51 y.o.   MRN: [id number]  HPI Here for several weeks of worsening stiffness and pain in the left neck and shoulder. His last steroid shot was one year ago. Also he asks about muscle aches which he thinks is from Lipitor. Last winter his dose was increased form 20 mg to 40 mg a day, and this is when the aches started. He has stopped the med for a week or two several times, and each times the aches go away.    Review of Systems  Constitutional: Negative.   HENT: Positive for neck pain and neck stiffness.        Objective:   Physical Exam  Constitutional: He appears well-developed and well-nourished.  Neck:       Neck is mildly tender on the left side   Lymphadenopathy:    He has no cervical adenopathy.          Assessment & Plan:  He is given a steroid shot today for the neck pain. Get fasting labs. Advised him to take 1/2 tab of Lipitor a day (total of 20 mg).

## 2011-11-11 ENCOUNTER — Telehealth: Payer: Self-pay | Admitting: Family Medicine

## 2011-11-11 NOTE — Telephone Encounter (Signed)
Pt called req script for methylphenidate (METADATE ER) 20 MG ER tablet.

## 2011-11-13 MED ORDER — METHYLPHENIDATE HCL 20 MG PO TABS: 20.0000 mg | ORAL_TABLET | Freq: Every day | ORAL | Status: DC

## 2011-11-13 NOTE — Telephone Encounter (Signed)
done

## 2011-11-14 NOTE — Telephone Encounter (Signed)
Script is ready and he spoke with [place].

## 2011-12-11 ENCOUNTER — Telehealth: Payer: Self-pay | Admitting: Family Medicine

## 2011-12-11 MED ORDER — HYDROCODONE-ACETAMINOPHEN 5-500 MG PO TABS: 1.0000 | ORAL_TABLET | Freq: Four times a day (QID) | ORAL | Status: DC | PRN

## 2011-12-11 NOTE — Telephone Encounter (Signed)
Call in #60 with 5 rf 

## 2011-12-11 NOTE — Telephone Encounter (Signed)
Rx called in to pharmacy. 

## 2011-12-11 NOTE — Telephone Encounter (Signed)
Pt requesting refill on HYDROCODONE-ACETAMINOPHEN 5-500 MG PO TABS

## 2011-12-16 ENCOUNTER — Other Ambulatory Visit: Payer: Self-pay | Admitting: Family Medicine

## 2012-01-28 ENCOUNTER — Telehealth: Payer: Self-pay | Admitting: Family Medicine

## 2012-01-28 MED ORDER — CLONAZEPAM 1 MG PO TABS: 1.0000 mg | ORAL_TABLET | Freq: Two times a day (BID) | ORAL | Status: DC | PRN

## 2012-01-28 NOTE — Telephone Encounter (Signed)
I called in script 

## 2012-01-28 NOTE — Telephone Encounter (Signed)
Call in #60 with 5 rf 

## 2012-01-28 NOTE — Telephone Encounter (Signed)
Refill request for Clonazepam 1 mg take 1 po bid prn and last here on 6/6/3. ( send to [facility] )

## 2012-03-08 ENCOUNTER — Other Ambulatory Visit: Payer: Self-pay | Admitting: Family Medicine

## 2012-03-08 MED ORDER — METHYLPHENIDATE HCL 20 MG PO TABS: 20.0000 mg | ORAL_TABLET | Freq: Every day | ORAL | Status: DC

## 2012-03-08 NOTE — Telephone Encounter (Signed)
done

## 2012-03-08 NOTE — Telephone Encounter (Signed)
Scripts are ready for pick up and I left a voice message. 

## 2012-03-08 NOTE — Telephone Encounter (Signed)
Pt needs new rx methylphenidate 20 mg daily.

## 2012-06-14 ENCOUNTER — Telehealth: Payer: Self-pay | Admitting: Family Medicine

## 2012-06-14 DIAGNOSIS — F909 Attention-deficit hyperactivity disorder, unspecified type: Secondary | ICD-10-CM

## 2012-06-14 NOTE — Telephone Encounter (Signed)
Patient came in stating that he need a refill of his methylphenidate (METADATE ER) 20 MG ER tablet [[id number] 1poqd and also needs a refill of his atorvastatin 40 mg as he would like to change the dosage to 20mg  patient states 40mg  is too strong. Please advise/assist.

## 2012-06-16 MED ORDER — METHYLPHENIDATE HCL ER 20 MG PO TBCR: 20.0000 mg | EXTENDED_RELEASE_TABLET | ORAL | Status: DC

## 2012-06-16 MED ORDER — ATORVASTATIN CALCIUM 20 MG PO TABS: 20.0000 mg | ORAL_TABLET | Freq: Every day | ORAL | Status: DC

## 2012-06-16 NOTE — Telephone Encounter (Signed)
All scripts are ready for pick up, left voice message for pt. I printed the Lipitor because there was 3 local pharmacies listed in chart, not sure which one pt uses.

## 2012-06-16 NOTE — Telephone Encounter (Signed)
Done. Also change Lipitor to 20 mg a day and send in a one year supply

## 2012-06-17 ENCOUNTER — Telehealth: Payer: Self-pay | Admitting: Family Medicine

## 2012-06-17 NOTE — Telephone Encounter (Signed)
FYI Pt pharm [facility] [address]

## 2012-06-19 NOTE — Telephone Encounter (Signed)
Noted in chart.

## 2012-07-28 ENCOUNTER — Other Ambulatory Visit (INDEPENDENT_AMBULATORY_CARE_PROVIDER_SITE_OTHER): Payer: BC Managed Care – PPO

## 2012-07-28 DIAGNOSIS — Z0000 Encounter for general adult medical examination without abnormal findings: Secondary | ICD-10-CM

## 2012-07-28 LAB — BASIC METABOLIC PANEL
BUN: 16 mg/dL (ref 6–23)
CO2: 27 mEq/L (ref 19–32)
Calcium: 8.9 mg/dL (ref 8.4–10.5)
Chloride: 104 mEq/L (ref 96–112)
Creatinine, Ser: 0.9 mg/dL (ref 0.4–1.5)
GFR: 94.74 mL/min (ref >60.00)
Glucose, Bld: 83 mg/dL (ref 70–99)
Potassium: 4.5 mEq/L (ref 3.5–5.1)
Sodium: 140 mEq/L (ref 135–145)

## 2012-07-28 LAB — LIPID PANEL
Cholesterol: 157 mg/dL (ref 0–200)
HDL: 34.7 mg/dL — ABNORMAL LOW (ref >39.00)
LDL Cholesterol: 85 mg/dL (ref 0–99)
Total CHOL/HDL Ratio: 5
Triglycerides: 189 mg/dL — ABNORMAL HIGH (ref 0.0–149.0)
VLDL: 37.8 mg/dL (ref 0.0–40.0)

## 2012-07-28 LAB — CBC WITH DIFFERENTIAL/PLATELET
Basophils Absolute: 0.1 10*3/uL (ref 0.0–0.1)
Basophils Relative: 0.6 % (ref 0.0–3.0)
Eosinophils Absolute: 0.2 10*3/uL (ref 0.0–0.7)
Eosinophils Relative: 1.7 % (ref 0.0–5.0)
HCT: 41.9 % (ref 39.0–52.0)
Hemoglobin: 14.3 g/dL (ref 13.0–17.0)
Lymphocytes Relative: 29.9 % (ref 12.0–46.0)
Lymphs Abs: 2.7 10*3/uL (ref 0.7–4.0)
MCHC: 34.1 g/dL (ref 30.0–36.0)
MCV: 87.1 fl (ref 78.0–100.0)
Monocytes Absolute: 0.4 10*3/uL (ref 0.1–1.0)
Monocytes Relative: 4.3 % (ref 3.0–12.0)
Neutro Abs: 5.8 10*3/uL (ref 1.4–7.7)
Neutrophils Relative %: 63.5 % (ref 43.0–77.0)
Platelets: 171 10*3/uL (ref 150.0–400.0)
RBC: 4.81 Mil/uL (ref 4.22–5.81)
RDW: 12.8 % (ref 11.5–14.6)
WBC: 9.1 10*3/uL (ref 4.5–10.5)

## 2012-07-28 LAB — POCT URINALYSIS DIPSTICK
Bilirubin, UA: NEGATIVE
Blood, UA: NEGATIVE
Glucose, UA: NEGATIVE
Ketones, UA: NEGATIVE
Leukocytes, UA: NEGATIVE
Nitrite, UA: NEGATIVE
Spec Grav, UA: 1.02
Urobilinogen, UA: 0.2
pH, UA: 7.5

## 2012-07-28 LAB — HEPATIC FUNCTION PANEL
ALT: 21 U/L (ref 0–53)
AST: 20 U/L (ref 0–37)
Albumin: 4.2 g/dL (ref 3.5–5.2)
Alkaline Phosphatase: 48 U/L (ref 39–117)
Bilirubin, Direct: 0.1 mg/dL (ref 0.0–0.3)
Total Bilirubin: 0.8 mg/dL (ref 0.3–1.2)
Total Protein: 6.6 g/dL (ref 6.0–8.3)

## 2012-07-28 LAB — PSA: PSA: 1.79 ng/mL (ref 0.10–4.00)

## 2012-07-28 LAB — TSH: TSH: 2.12 u[IU]/mL (ref 0.35–5.50)

## 2012-08-02 NOTE — Progress Notes (Signed)
Quick Note:  Pt has appointment on 08/04/12 will go over then. ______

## 2012-08-04 ENCOUNTER — Ambulatory Visit (INDEPENDENT_AMBULATORY_CARE_PROVIDER_SITE_OTHER): Payer: BC Managed Care – PPO | Admitting: Family Medicine

## 2012-08-04 ENCOUNTER — Encounter: Payer: Self-pay | Admitting: Family Medicine

## 2012-08-04 VITALS — BP 144/88 | HR 75 | Temp 97.7°F | Ht 72.5 in | Wt 216.0 lb

## 2012-08-04 DIAGNOSIS — Z0000 Encounter for general adult medical examination without abnormal findings: Secondary | ICD-10-CM

## 2012-08-04 MED ORDER — TERBINAFINE HCL 250 MG PO TABS: 250.0000 mg | ORAL_TABLET | Freq: Every day | ORAL | Status: DC

## 2012-08-04 MED ORDER — HYDROCODONE-ACETAMINOPHEN 5-500 MG PO TABS: 1.0000 | ORAL_TABLET | Freq: Four times a day (QID) | ORAL | Status: DC | PRN

## 2012-08-04 MED ORDER — CLONAZEPAM 1 MG PO TABS: 1.0000 mg | ORAL_TABLET | Freq: Two times a day (BID) | ORAL | Status: DC | PRN

## 2012-08-04 NOTE — Progress Notes (Signed)
  Subjective:    Patient ID: Riley Hartman, male    DOB: [birth year], 52 y.o.   MRN: [id number]  HPI 52 yr old male for a cpx. He feels well except for a few joint pains and some toenail fungus. He has had this for 10 years but never mentioned it before.    Review of Systems  Constitutional: Negative.   HENT: Negative.   Eyes: Negative.   Respiratory: Negative.   Cardiovascular: Negative.   Gastrointestinal: Negative.   Genitourinary: Negative.   Musculoskeletal: Negative.   Skin: Negative.   Neurological: Negative.   Psychiatric/Behavioral: Negative.        Objective:   Physical Exam  Constitutional: He is oriented to person, place, and time. He appears well-developed and well-nourished. No distress.  HENT:  Head: Normocephalic and atraumatic.  Right Ear: External ear normal.  Left Ear: External ear normal.  Nose: Nose normal.  Mouth/Throat: Oropharynx is clear and moist. No oropharyngeal exudate.  Eyes: Conjunctivae and EOM are normal. Pupils are equal, round, and reactive to light. Right eye exhibits no discharge. Left eye exhibits no discharge. No scleral icterus.  Neck: Neck supple. No JVD present. No tracheal deviation present. No thyromegaly present.  Cardiovascular: Normal rate, regular rhythm, normal heart sounds and intact distal pulses.  Exam reveals no gallop and no friction rub.   No murmur heard. EKG shows stable RBBB  Pulmonary/Chest: Effort normal and breath sounds normal. No respiratory distress. He has no wheezes. He has no rales. He exhibits no tenderness.  Abdominal: Soft. Bowel sounds are normal. He exhibits no distension and no mass. There is no tenderness. There is no rebound and no guarding.  Genitourinary: Rectum normal, prostate normal and penis normal. Guaiac negative stool. No penile tenderness.  Musculoskeletal: Normal range of motion. He exhibits no edema and no tenderness.  Lymphadenopathy:    He has no cervical adenopathy.  Neurological: He is  alert and oriented to person, place, and time. He has normal reflexes. No cranial nerve deficit. He exhibits normal muscle tone. Coordination normal.  Skin: Skin is warm and dry. No rash noted. He is not diaphoretic. No erythema. No pallor.  Most of his toenails are thickened and yellow   Psychiatric: He has a normal mood and affect. His behavior is normal. Judgment and thought content normal.          Assessment & Plan:  Well exam. Try Terbinafine for the toenails.

## 2012-09-14 ENCOUNTER — Telehealth: Payer: Self-pay | Admitting: Family Medicine

## 2012-09-14 NOTE — Telephone Encounter (Signed)
Change the Norco to 5/325 to take q 6 hours prn pain, call in #[phone] with 5 rf

## 2012-09-14 NOTE — Telephone Encounter (Signed)
Refill request to change dose of Norco and call to pharmacy.

## 2012-09-16 MED ORDER — HYDROCODONE-ACETAMINOPHEN 5-325 MG PO TABS: 1.0000 | ORAL_TABLET | Freq: Four times a day (QID) | ORAL | Status: DC | PRN

## 2012-09-16 NOTE — Telephone Encounter (Signed)
I called in script 

## 2012-09-27 ENCOUNTER — Telehealth: Payer: Self-pay | Admitting: Family Medicine

## 2012-09-27 MED ORDER — METHYLPHENIDATE HCL ER 20 MG PO TBCR: 20.0000 mg | EXTENDED_RELEASE_TABLET | ORAL | Status: DC

## 2012-09-27 NOTE — Telephone Encounter (Signed)
PT is calling to request a 3 month supply of methylphenidate (METADATE ER) 20 MG ER tablet. Please assist.

## 2012-09-27 NOTE — Telephone Encounter (Signed)
done

## 2012-09-28 NOTE — Telephone Encounter (Signed)
Script is ready for pick up and I left a voice message for pt. 

## 2012-12-27 ENCOUNTER — Telehealth: Payer: Self-pay | Admitting: Family Medicine

## 2012-12-27 NOTE — Telephone Encounter (Signed)
Pt is calling to request a 3 month supply of methylphenidate (METADATE ER) 20 MG ER tablet. Please assist.

## 2012-12-28 MED ORDER — METHYLPHENIDATE HCL ER 20 MG PO TBCR: 20.0000 mg | EXTENDED_RELEASE_TABLET | ORAL | Status: DC

## 2012-12-28 NOTE — Telephone Encounter (Signed)
done

## 2012-12-28 NOTE — Telephone Encounter (Signed)
Script is ready for pick up and I left voice message. 

## 2013-02-23 ENCOUNTER — Other Ambulatory Visit: Payer: Self-pay | Admitting: Family Medicine

## 2013-03-18 ENCOUNTER — Encounter: Payer: Self-pay | Admitting: *Deleted

## 2013-03-21 ENCOUNTER — Encounter: Payer: Self-pay | Admitting: Family Medicine

## 2013-03-21 ENCOUNTER — Ambulatory Visit (INDEPENDENT_AMBULATORY_CARE_PROVIDER_SITE_OTHER): Payer: BC Managed Care – PPO | Admitting: Family Medicine

## 2013-03-21 VITALS — BP 140/90 | HR 89 | Temp 97.7°F | Wt 234.0 lb

## 2013-03-21 DIAGNOSIS — M79674 Pain in right toe(s): Secondary | ICD-10-CM

## 2013-03-21 DIAGNOSIS — M79609 Pain in unspecified limb: Secondary | ICD-10-CM

## 2013-03-21 DIAGNOSIS — F909 Attention-deficit hyperactivity disorder, unspecified type: Secondary | ICD-10-CM

## 2013-03-21 MED ORDER — DICLOFENAC SODIUM 75 MG PO TBEC: 75.0000 mg | DELAYED_RELEASE_TABLET | Freq: Two times a day (BID) | ORAL | Status: DC

## 2013-03-21 MED ORDER — METHYLPHENIDATE HCL ER 20 MG PO TBCR: 20.0000 mg | EXTENDED_RELEASE_TABLET | ORAL | Status: DC

## 2013-03-21 NOTE — Progress Notes (Signed)
   Subjective:    Patient ID: Riley Hartman, male    DOB: [birth year], 52 y.o.   MRN: [id number]  HPI Here for wosening pains in the right great toe. No redness or warmth or swelling. The pains are sharp and intermittent, and sometimes walking is very difficult. Advil and ice do not help. His methyphenidate works well.    Review of Systems  Constitutional: Negative.   Musculoskeletal: Positive for arthralgias.  Neurological: Negative.   Psychiatric/Behavioral: Negative.        Objective:   Physical Exam  Constitutional: He is oriented to person, place, and time. He appears well-developed and well-nourished.  Musculoskeletal:  The right first MTP is tender and he has a lot of pain on movement.  Neurological: He is alert and oriented to person, place, and time.          Assessment & Plan:  Try Diclofenac and refer to Podiatry. Refilled meds

## 2013-03-21 NOTE — Progress Notes (Signed)
Pre visit review using our clinic review tool, if applicable. No additional management support is needed unless otherwise documented below in the visit note. 

## 2013-04-20 ENCOUNTER — Ambulatory Visit: Payer: Self-pay

## 2013-04-20 ENCOUNTER — Ambulatory Visit (INDEPENDENT_AMBULATORY_CARE_PROVIDER_SITE_OTHER): Payer: BC Managed Care – PPO

## 2013-04-20 ENCOUNTER — Ambulatory Visit (INDEPENDENT_AMBULATORY_CARE_PROVIDER_SITE_OTHER): Payer: BC Managed Care – PPO | Admitting: Podiatry

## 2013-04-20 ENCOUNTER — Encounter: Payer: Self-pay | Admitting: Podiatry

## 2013-04-20 VITALS — BP 140/92 | HR 81 | Resp 16 | Ht 73.0 in | Wt 225.0 lb

## 2013-04-20 DIAGNOSIS — M2020 Hallux rigidus, unspecified foot: Secondary | ICD-10-CM

## 2013-04-20 DIAGNOSIS — M779 Enthesopathy, unspecified: Secondary | ICD-10-CM

## 2013-04-20 DIAGNOSIS — M205X9 Other deformities of toe(s) (acquired), unspecified foot: Secondary | ICD-10-CM

## 2013-04-20 DIAGNOSIS — M205X1 Other deformities of toe(s) (acquired), right foot: Secondary | ICD-10-CM

## 2013-04-20 DIAGNOSIS — M2021 Hallux rigidus, right foot: Secondary | ICD-10-CM

## 2013-04-20 MED ORDER — TRIAMCINOLONE ACETONIDE 10 MG/ML IJ SUSP
10.0000 mg | Freq: Once | INTRAMUSCULAR | Status: AC
  Administered 2013-04-20: 10 mg

## 2013-04-20 NOTE — Progress Notes (Signed)
   Subjective:    Patient ID: Riley Hartman, male    DOB: Mar 15, 1961, 53 y.o.   MRN: [id number]  HPI Comments: "I have pain in this toe joint"  Pt has been having stabbing, burning and spasms in the 1st MPJ right for about a couple months. He has limited ROM, can't dorsiflex at all. Increased pain with walking and certain shoes. He has hydrocodone which has taken a few times with no relief.  Foot Pain      Review of Systems  All other systems reviewed and are negative.       Objective:   Physical Exam        Assessment & Plan:

## 2013-04-21 NOTE — Progress Notes (Signed)
Subjective:     Patient ID: Riley Hartman, male   DOB: [birth year], 53 y.o.   MRN: [id number]  Foot Pain   53 year old male presents on referral from Dr. Alysia Penna with long-term pain in the big toe joint of his right foot. He states it has intensified over the last year and that he has been walking differently causing pain in his lower extremity.   Review of Systems  All other systems reviewed and are negative.       Objective:   Physical Exam  Nursing note and vitals reviewed. Constitutional: He is oriented to person, place, and time.  Cardiovascular: Intact distal pulses.   Musculoskeletal: Normal range of motion.  Neurological: He is oriented to person, place, and time.  Skin: Skin is warm.   neurovascular status intact with no muscle strength loss no equinus condition noted and normal range of motion of the subtalar and midtarsal joint. Extreme discomfort first MPJ right with loss of motion of the joint with approximate 5-10 of dorsiflexion 5 of plantar flexion with edema around the joint noted. 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 Today I went ahead and I injected the lateral joint 3 mg Kenalog 5 mg Xylocaine Marcaine mixture and across the dorsal surface. He will most likely need hallux limitus repair and will reappoint 4 months with discussion for performing this procedure over the summer and less it should not respond to the cortisone injection reappoint at that time

## 2013-04-26 ENCOUNTER — Telehealth: Payer: Self-pay | Admitting: Family Medicine

## 2013-04-26 NOTE — Telephone Encounter (Signed)
Pt lost last 2 rx for methylphenidate

## 2013-04-27 MED ORDER — METHYLPHENIDATE HCL ER 20 MG PO TBCR: 20.0000 mg | EXTENDED_RELEASE_TABLET | ORAL | Status: DC

## 2013-04-27 NOTE — Telephone Encounter (Signed)
Done for one month at a time  only. We cannot do multiple refills any longer

## 2013-04-28 ENCOUNTER — Telehealth: Payer: Self-pay

## 2013-04-28 NOTE — Telephone Encounter (Signed)
Correction to previous phone note: Rx printed for one month. Pt aware ready to pick up

## 2013-04-28 NOTE — Telephone Encounter (Signed)
Pt aware that he will have to wait until time to fill again. Pt verbalized understanding

## 2013-04-28 NOTE — Telephone Encounter (Signed)
Dr [name], I wanted to let you know that patient came in with and he found both rx's in his truck.  They fell behind his seat.  The rx's were wet and saturated.  I gave him the new rx you wrote and shredded the other 2.  The patient stated that he usually keeps them in his visor but they must of fell down.  Nonetheless he returned the other 2 and they were shredded

## 2013-05-11 ENCOUNTER — Encounter: Payer: Self-pay | Admitting: Family Medicine

## 2013-06-13 ENCOUNTER — Telehealth: Payer: Self-pay | Admitting: Family Medicine

## 2013-06-13 NOTE — Telephone Encounter (Signed)
Pt needs new rx methyphenidate 20 mg

## 2013-06-15 MED ORDER — METHYLPHENIDATE HCL ER 20 MG PO TBCR: 20.0000 mg | EXTENDED_RELEASE_TABLET | ORAL | Status: DC

## 2013-06-15 NOTE — Telephone Encounter (Signed)
Done for 3 months

## 2013-06-15 NOTE — Telephone Encounter (Signed)
Script is ready for pick up and I spoke with pt.  

## 2013-07-12 ENCOUNTER — Encounter: Payer: Self-pay | Admitting: Family Medicine

## 2013-07-12 ENCOUNTER — Ambulatory Visit (INDEPENDENT_AMBULATORY_CARE_PROVIDER_SITE_OTHER): Payer: BC Managed Care – PPO | Admitting: Family Medicine

## 2013-07-12 ENCOUNTER — Telehealth: Payer: Self-pay | Admitting: Family Medicine

## 2013-07-12 VITALS — BP 144/90 | HR 84 | Temp 97.8°F | Ht 73.0 in | Wt 230.0 lb

## 2013-07-12 DIAGNOSIS — L989 Disorder of the skin and subcutaneous tissue, unspecified: Secondary | ICD-10-CM

## 2013-07-12 NOTE — Progress Notes (Signed)
   Subjective:    Patient ID: Riley Hartman, male    DOB: 06/02/1960, 53 y.o.   MRN: [id number]  HPI Here to check a lesion on the right forearm that he noticed about 8 months ago. It does not bother him and it has not changed in appearance.    Review of Systems  Constitutional: Negative.        Objective:   Physical Exam  Constitutional: He appears well-developed and well-nourished.  Skin:  Both dorsal forearms have a lot of actinic damage. The right forearm has a non-pigmented nodular lesion about 61mm in diameter          Assessment & Plan:  Since this lesion is somewhat worrisome we will plan on excising this soon. He will set this up.

## 2013-07-12 NOTE — Telephone Encounter (Signed)
Relevant patient education mailed to patient.  

## 2013-07-12 NOTE — Progress Notes (Signed)
Pre visit review using our clinic review tool, if applicable. No additional management support is needed unless otherwise documented below in the visit note. 

## 2013-07-26 ENCOUNTER — Ambulatory Visit: Payer: BC Managed Care – PPO | Admitting: Family Medicine

## 2013-07-28 ENCOUNTER — Other Ambulatory Visit (HOSPITAL_COMMUNITY)
Admission: RE | Admit: 2013-07-28 | Discharge: 2013-07-28 | Disposition: A | Discharge status: Home / Self Care | Payer: BC Managed Care – PPO | Source: Ambulatory Visit | Attending: Family Medicine | Admitting: Family Medicine

## 2013-07-28 ENCOUNTER — Ambulatory Visit (INDEPENDENT_AMBULATORY_CARE_PROVIDER_SITE_OTHER): Payer: BC Managed Care – PPO | Admitting: Family Medicine

## 2013-07-28 ENCOUNTER — Encounter: Payer: Self-pay | Admitting: Family Medicine

## 2013-07-28 VITALS — BP 140/96 | HR 81 | Temp 98.0°F | Ht 73.0 in | Wt 230.0 lb

## 2013-07-28 DIAGNOSIS — L989 Disorder of the skin and subcutaneous tissue, unspecified: Secondary | ICD-10-CM

## 2013-07-28 NOTE — Progress Notes (Signed)
Pre visit review using our clinic review tool, if applicable. No additional management support is needed unless otherwise documented below in the visit note. 

## 2013-07-28 NOTE — Addendum Note (Signed)
Addended by: Aggie Hacker A on: 07/28/2013 09:46 AM   Modules accepted: Orders

## 2013-07-28 NOTE — Progress Notes (Signed)
   Subjective:    Patient ID: Riley Hartman, male    DOB: 06/13/1960, 53 y.o.   MRN: [id number]  HPI Here to remove a lesion on the right forearm. This measures 1.4 cm in diameter.    Review of Systems  Constitutional: Negative.        Objective:   Physical Exam  Constitutional: He appears well-developed and well-nourished.          Assessment & Plan:  The area was cleansed with Betadine and LA was achieved using 2% Xylocaine with epinephrine. An elliptical incision was made with a scalpel around the lesion down to the subcutaneous fat layer. The lesion was removed and sent to Pathology. The wound was closed with 7 sutures of 4-0 Ethilon. Dressed with Neosporin and a Bandaid. These are to be removed in 14 days

## 2013-08-17 ENCOUNTER — Other Ambulatory Visit: Payer: Self-pay | Admitting: Family Medicine

## 2013-08-18 ENCOUNTER — Encounter: Payer: Self-pay | Admitting: Podiatry

## 2013-08-18 ENCOUNTER — Ambulatory Visit (INDEPENDENT_AMBULATORY_CARE_PROVIDER_SITE_OTHER): Payer: BC Managed Care – PPO | Admitting: Podiatry

## 2013-08-18 VITALS — BP 133/86 | HR 78 | Resp 12

## 2013-08-18 DIAGNOSIS — M2020 Hallux rigidus, unspecified foot: Secondary | ICD-10-CM

## 2013-08-18 DIAGNOSIS — M779 Enthesopathy, unspecified: Secondary | ICD-10-CM

## 2013-08-18 MED ORDER — TRIAMCINOLONE ACETONIDE 10 MG/ML IJ SUSP
10.0000 mg | Freq: Once | INTRAMUSCULAR | Status: AC
  Administered 2013-08-18: 10 mg

## 2013-08-18 NOTE — Progress Notes (Signed)
Subjective:     Patient ID: Riley Hartman, male   DOB: Dec 20, 1960, 53 y.o.   MRN: [id number]  HPI patient presents stating that he did very well with the injection but is starting to hurt again he wants to get it fixed but he needs to wait until the fall. Also states the other one is starting to bother him a little bit more  Review of Systems     Objective:   Physical Exam Neurovascular status intact with diminished range of motion first MPJ right with crepitus and pain in the joint and mild in the left    Assessment:     Hallux limitus deformity right over left with inflammation and fluid buildup indicating acute capsulitis    Plan:     Reviewed condition and we discussed correction in the fall. Scanned for custom orthotics to reduce stress against his first MPJ and try to encourage motion and I did reinject the first MPJ 3 mg Kenalog 5 mg Xylocaine Marcaine mixture. Educated him on the procedure and recovery

## 2013-09-07 ENCOUNTER — Ambulatory Visit (INDEPENDENT_AMBULATORY_CARE_PROVIDER_SITE_OTHER): Payer: BC Managed Care – PPO | Admitting: Family Medicine

## 2013-09-07 ENCOUNTER — Encounter: Payer: Self-pay | Admitting: Family Medicine

## 2013-09-07 VITALS — BP 148/89 | HR 99 | Temp 98.7°F | Ht 73.0 in | Wt 229.0 lb

## 2013-09-07 DIAGNOSIS — S8390XA Sprain of unspecified site of unspecified knee, initial encounter: Secondary | ICD-10-CM

## 2013-09-07 DIAGNOSIS — IMO0002 Reserved for concepts with insufficient information to code with codable children: Secondary | ICD-10-CM

## 2013-09-07 MED ORDER — METHYLPHENIDATE HCL ER 20 MG PO TBCR: 20.0000 mg | EXTENDED_RELEASE_TABLET | ORAL | Status: DC

## 2013-09-07 MED ORDER — METHYLPREDNISOLONE 4 MG PO KIT: PACK | ORAL | Status: AC

## 2013-09-07 NOTE — Progress Notes (Signed)
   Subjective:    Patient ID: Riley Hartman, male    DOB: 24-Oct-1960, 53 y.o.   MRN: [id number]  HPI Here for an injury to the left knee which occurred on 09-04-13 when he was walking in a backyard. The right foot slipped out from under him and he fell, twisting the left knee. This was immediately painful, and over the next 12 hours the knee swelled up.  No locking or giving way.    Review of Systems  Constitutional: Negative.   Musculoskeletal: Positive for arthralgias and joint swelling.       Objective:   Physical Exam  Constitutional: He appears well-developed and well-nourished.  In mild pain, limping   Musculoskeletal:  The left knee is mildly swollen but not very tender. Full ROM. Negative McMurrays. Anterior drawer shows some slight laxity on the left           Assessment & Plan:  Possible torn ACL. Given a steroid dose pack. Use ice. Wear a knee brace for more support. Refer to Orthopedics.

## 2013-09-07 NOTE — Progress Notes (Signed)
Pre visit review using our clinic review tool, if applicable. No additional management support is needed unless otherwise documented below in the visit note. 

## 2013-09-09 ENCOUNTER — Ambulatory Visit (INDEPENDENT_AMBULATORY_CARE_PROVIDER_SITE_OTHER): Payer: BC Managed Care – PPO | Admitting: *Deleted

## 2013-09-09 DIAGNOSIS — M2020 Hallux rigidus, unspecified foot: Secondary | ICD-10-CM

## 2013-09-09 NOTE — Patient Instructions (Signed)

## 2013-09-09 NOTE — Progress Notes (Signed)
   Subjective:    Patient ID: Riley Hartman, male    DOB: [birth year], 53 y.o.   MRN: [id number]  HPI PICK UP ORTHOTICS AND GIVEN INSTRUCTION.   Review of Systems     Objective:   Physical Exam        Assessment & Plan:

## 2013-11-14 ENCOUNTER — Telehealth: Payer: Self-pay | Admitting: *Deleted

## 2013-11-14 NOTE — Telephone Encounter (Signed)
I need to cancel surgery scheduled for September 1st.  Thank you, have a blessed day.

## 2013-11-14 NOTE — Telephone Encounter (Signed)
I called and left him a message to call me back.  Got message about the cancellation but want to know if you want to reschedule.  I informed Dr Paulla Dolly and the surgical center.

## 2013-11-24 ENCOUNTER — Ambulatory Visit: Payer: BC Managed Care – PPO | Admitting: Podiatry

## 2013-12-08 ENCOUNTER — Telehealth: Payer: Self-pay | Admitting: Family Medicine

## 2013-12-08 NOTE — Telephone Encounter (Signed)
We will do this tomorrow at his OV

## 2013-12-08 NOTE — Telephone Encounter (Signed)
° ° °

## 2013-12-09 ENCOUNTER — Ambulatory Visit (INDEPENDENT_AMBULATORY_CARE_PROVIDER_SITE_OTHER): Payer: BC Managed Care – PPO | Admitting: Family Medicine

## 2013-12-09 ENCOUNTER — Encounter: Payer: Self-pay | Admitting: Family Medicine

## 2013-12-09 VITALS — BP 138/94 | HR 85 | Temp 97.6°F | Ht 73.0 in | Wt 232.0 lb

## 2013-12-09 DIAGNOSIS — F909 Attention-deficit hyperactivity disorder, unspecified type: Secondary | ICD-10-CM

## 2013-12-09 DIAGNOSIS — E785 Hyperlipidemia, unspecified: Secondary | ICD-10-CM

## 2013-12-09 DIAGNOSIS — I10 Essential (primary) hypertension: Secondary | ICD-10-CM

## 2013-12-09 DIAGNOSIS — F411 Generalized anxiety disorder: Secondary | ICD-10-CM

## 2013-12-09 DIAGNOSIS — N138 Other obstructive and reflux uropathy: Secondary | ICD-10-CM

## 2013-12-09 DIAGNOSIS — N401 Enlarged prostate with lower urinary tract symptoms: Secondary | ICD-10-CM

## 2013-12-09 DIAGNOSIS — R972 Elevated prostate specific antigen [PSA]: Secondary | ICD-10-CM

## 2013-12-09 DIAGNOSIS — N139 Obstructive and reflux uropathy, unspecified: Secondary | ICD-10-CM

## 2013-12-09 LAB — CBC WITH DIFFERENTIAL/PLATELET
Basophils Absolute: 0.1 10*3/uL (ref 0.0–0.1)
Basophils Relative: 0.5 % (ref 0.0–3.0)
Eosinophils Absolute: 0.2 10*3/uL (ref 0.0–0.7)
Eosinophils Relative: 1.3 % (ref 0.0–5.0)
HCT: 44.1 % (ref 39.0–52.0)
Hemoglobin: 14.9 g/dL (ref 13.0–17.0)
Lymphocytes Relative: 28.7 % (ref 12.0–46.0)
Lymphs Abs: 3.7 10*3/uL (ref 0.7–4.0)
MCHC: 33.8 g/dL (ref 30.0–36.0)
MCV: 88 fl (ref 78.0–100.0)
Monocytes Absolute: 0.7 10*3/uL (ref 0.1–1.0)
Monocytes Relative: 5.5 % (ref 3.0–12.0)
Neutro Abs: 8.3 10*3/uL — ABNORMAL HIGH (ref 1.4–7.7)
Neutrophils Relative %: 64 % (ref 43.0–77.0)
Platelets: 209 10*3/uL (ref 150.0–400.0)
RBC: 5.02 Mil/uL (ref 4.22–5.81)
RDW: 14.3 % (ref 11.5–15.5)
WBC: 13 10*3/uL — ABNORMAL HIGH (ref 4.0–10.5)

## 2013-12-09 LAB — HEPATIC FUNCTION PANEL
ALT: 29 U/L (ref 0–53)
AST: 19 U/L (ref 0–37)
Albumin: 4.3 g/dL (ref 3.5–5.2)
Alkaline Phosphatase: 56 U/L (ref 39–117)
Bilirubin, Direct: 0.1 mg/dL (ref 0.0–0.3)
Total Bilirubin: 0.6 mg/dL (ref 0.2–1.2)
Total Protein: 7.5 g/dL (ref 6.0–8.3)

## 2013-12-09 LAB — LIPID PANEL
Cholesterol: 229 mg/dL — ABNORMAL HIGH (ref 0–200)
HDL: 45.5 mg/dL (ref >39.00)
LDL Cholesterol: 149 mg/dL — ABNORMAL HIGH (ref 0–99)
NonHDL: 183.5
Total CHOL/HDL Ratio: 5
Triglycerides: 172 mg/dL — ABNORMAL HIGH (ref 0.0–149.0)
VLDL: 34.4 mg/dL (ref 0.0–40.0)

## 2013-12-09 LAB — BASIC METABOLIC PANEL
BUN: 17 mg/dL (ref 6–23)
CO2: 28 mEq/L (ref 19–32)
Calcium: 9.7 mg/dL (ref 8.4–10.5)
Chloride: 103 mEq/L (ref 96–112)
Creatinine, Ser: 0.8 mg/dL (ref 0.4–1.5)
GFR: 100.77 mL/min (ref >60.00)
Glucose, Bld: 85 mg/dL (ref 70–99)
Potassium: 4.6 mEq/L (ref 3.5–5.1)
Sodium: 140 mEq/L (ref 135–145)

## 2013-12-09 LAB — POCT URINALYSIS DIPSTICK
Bilirubin, UA: NEGATIVE
Blood, UA: NEGATIVE
Glucose, UA: NEGATIVE
Ketones, UA: NEGATIVE
Leukocytes, UA: NEGATIVE
Nitrite, UA: NEGATIVE
Protein, UA: NEGATIVE
Spec Grav, UA: 1.015
Urobilinogen, UA: 0.2
pH, UA: 6.5

## 2013-12-09 LAB — PSA: PSA: 4.15 ng/mL — ABNORMAL HIGH (ref 0.10–4.00)

## 2013-12-09 LAB — TSH: TSH: 2.56 u[IU]/mL (ref 0.35–4.50)

## 2013-12-09 MED ORDER — METHYLPHENIDATE HCL ER 20 MG PO TBCR: 20.0000 mg | EXTENDED_RELEASE_TABLET | ORAL | Status: DC

## 2013-12-09 MED ORDER — CLONAZEPAM 1 MG PO TABS: 1.0000 mg | ORAL_TABLET | Freq: Two times a day (BID) | ORAL | Status: DC | PRN

## 2013-12-09 NOTE — Progress Notes (Signed)
   Subjective:    Patient ID: Riley Hartman, male    DOB: 1960-10-02, 53 y.o.   MRN: [id number]  HPI Here to follow up. He has been doing well.    Review of Systems  Constitutional: Negative.   Respiratory: Negative.   Cardiovascular: Negative.   Neurological: Negative.        Objective:   Physical Exam  Constitutional: He is oriented to person, place, and time. He appears well-developed and well-nourished.  Cardiovascular: Normal rate, regular rhythm, normal heart sounds and intact distal pulses.   Pulmonary/Chest: Effort normal and breath sounds normal.  Neurological: He is alert and oriented to person, place, and time.          Assessment & Plan:  Refilled meds. Get fasting labs

## 2013-12-09 NOTE — Progress Notes (Signed)
Pre visit review using our clinic review tool, if applicable. No additional management support is needed unless otherwise documented below in the visit note. 

## 2013-12-09 NOTE — Addendum Note (Signed)
Addended by: Alysia Penna A on: 12/09/2013 05:26 PM   Modules accepted: Orders

## 2013-12-12 ENCOUNTER — Encounter: Payer: Self-pay | Admitting: Family Medicine

## 2013-12-12 ENCOUNTER — Ambulatory Visit (INDEPENDENT_AMBULATORY_CARE_PROVIDER_SITE_OTHER): Payer: BC Managed Care – PPO | Admitting: Family Medicine

## 2013-12-12 VITALS — BP 164/106 | HR 78 | Temp 98.1°F | Ht 73.0 in | Wt 237.0 lb

## 2013-12-12 DIAGNOSIS — T6391XA Toxic effect of contact with unspecified venomous animal, accidental (unintentional), initial encounter: Secondary | ICD-10-CM

## 2013-12-12 DIAGNOSIS — T63301A Toxic effect of unspecified spider venom, accidental (unintentional), initial encounter: Secondary | ICD-10-CM

## 2013-12-12 DIAGNOSIS — T63391A Toxic effect of venom of other spider, accidental (unintentional), initial encounter: Secondary | ICD-10-CM

## 2013-12-12 MED ORDER — DOXYCYCLINE HYCLATE 100 MG PO CAPS: 100.0000 mg | ORAL_CAPSULE | Freq: Two times a day (BID) | ORAL | Status: AC

## 2013-12-12 NOTE — Progress Notes (Signed)
   Subjective:    Patient ID: Riley Hartman, male    DOB: Jun 08, 1960, 53 y.o.   MRN: [id number]  HPI Here for an apparent spider bite to the right knee 2 days ago. He is using ice packs. The area around it has turned red. It is tender but not painful. No fever.   Review of Systems  Constitutional: Negative.   Skin: Positive for wound.       Objective:   Physical Exam  Constitutional: He appears well-developed and well-nourished.  Skin:  The right knee has an area of macular erythema           Assessment & Plan:  Cover with Doxycycline

## 2013-12-12 NOTE — Progress Notes (Signed)
Pre visit review using our clinic review tool, if applicable. No additional management support is needed unless otherwise documented below in the visit note. 

## 2013-12-14 ENCOUNTER — Telehealth: Payer: Self-pay

## 2013-12-14 NOTE — Telephone Encounter (Signed)
lm

## 2013-12-21 ENCOUNTER — Encounter: Payer: BC Managed Care – PPO | Admitting: Podiatry

## 2014-03-02 ENCOUNTER — Telehealth: Payer: Self-pay | Admitting: Family Medicine

## 2014-03-02 MED ORDER — ATORVASTATIN CALCIUM 20 MG PO TABS: 20.0000 mg | ORAL_TABLET | Freq: Every day | ORAL | Status: DC

## 2014-03-02 NOTE — Telephone Encounter (Signed)
Refill request for Lipitor and I did send script e-scribe.

## 2014-03-20 ENCOUNTER — Telehealth: Payer: Self-pay | Admitting: Family Medicine

## 2014-03-20 MED ORDER — AZITHROMYCIN 250 MG PO TABS: ORAL_TABLET | ORAL | Status: DC

## 2014-03-20 NOTE — Telephone Encounter (Signed)
Call in a Zpack  ?

## 2014-03-20 NOTE — Telephone Encounter (Signed)
I sent script e-scribe and left a voice message for pt with this information.  

## 2014-03-20 NOTE — Telephone Encounter (Signed)
Pt has sinus inf and req zpak call into [facility] [address] in [place].,[place].

## 2014-03-23 ENCOUNTER — Encounter (HOSPITAL_COMMUNITY): Payer: Self-pay | Admitting: Cardiology

## 2014-05-10 ENCOUNTER — Other Ambulatory Visit: Payer: Self-pay | Admitting: Family Medicine

## 2014-05-10 ENCOUNTER — Telehealth: Payer: Self-pay | Admitting: Family Medicine

## 2014-05-10 MED ORDER — METHYLPHENIDATE HCL ER 20 MG PO TBCR: 20.0000 mg | EXTENDED_RELEASE_TABLET | ORAL | Status: DC

## 2014-05-10 NOTE — Telephone Encounter (Signed)
done

## 2014-05-10 NOTE — Telephone Encounter (Signed)
Pt request refill methylphenidate (METADATE ER) 20 MG ER tablet 3 mo supply

## 2014-05-11 NOTE — Telephone Encounter (Signed)
Script is ready for pick up and I left a voice message for pt. 

## 2014-06-19 ENCOUNTER — Encounter: Payer: Self-pay | Admitting: Family Medicine

## 2014-06-19 ENCOUNTER — Ambulatory Visit (INDEPENDENT_AMBULATORY_CARE_PROVIDER_SITE_OTHER): Payer: Self-pay | Admitting: Family Medicine

## 2014-06-19 ENCOUNTER — Ambulatory Visit (INDEPENDENT_AMBULATORY_CARE_PROVIDER_SITE_OTHER)
Admission: RE | Admit: 2014-06-19 | Discharge: 2014-06-19 | Disposition: A | Discharge status: Home / Self Care | Payer: BLUE CROSS/BLUE SHIELD | Source: Ambulatory Visit | Attending: Family Medicine | Admitting: Family Medicine

## 2014-06-19 VITALS — BP 126/76 | HR 93 | Temp 98.4°F | Ht 73.0 in | Wt 226.0 lb

## 2014-06-19 DIAGNOSIS — S63501A Unspecified sprain of right wrist, initial encounter: Secondary | ICD-10-CM | POA: Diagnosis not present

## 2014-06-19 NOTE — Progress Notes (Signed)
   Subjective:    Patient ID: Riley Hartman, male    DOB: Aug 15, 1960, 54 y.o.   MRN: [id number]  HPI Here to check his right wrist which he injured on 06-17-14 at work. He was trying to pull up a piece of asphalt when it gave way and he feel backwards onto the wrist. It was immediately swollen and painful, but is has improved a bit since then. He went back to work today but he is careful not to use the right hand very much. It is mildly painful.    Review of Systems  Constitutional: Negative.   Musculoskeletal: Positive for joint swelling and arthralgias.       Objective:   Physical Exam  Constitutional: He appears well-developed and well-nourished. No distress.  Musculoskeletal:  The right wrist is mildly swollen over the ulnar and radial sides, it is mildly tender. ROM is full           Assessment & Plan:  Probable wrist sprain but we will get Xrays to rule out fractures. If negative, we will have him wear a long splint and follow up

## 2014-06-19 NOTE — Progress Notes (Signed)
Pre visit review using our clinic review tool, if applicable. No additional management support is needed unless otherwise documented below in the visit note. 

## 2014-07-06 ENCOUNTER — Telehealth: Payer: Self-pay | Admitting: Family Medicine

## 2014-07-06 NOTE — Telephone Encounter (Signed)
Pt needs new rx methylphenidate

## 2014-07-06 NOTE — Telephone Encounter (Signed)
Called and spoke with pt and pt is aware.  Pt states he has been looking for the other prescription but cannot find it. He states he will continue to look for the prescription.  Pt would like Dr. [name] to know he was not trying to get the prescription early.

## 2014-07-06 NOTE — Telephone Encounter (Signed)
No, He already has refills until 08-09-14

## 2014-08-10 ENCOUNTER — Telehealth: Payer: Self-pay | Admitting: Family Medicine

## 2014-08-10 NOTE — Telephone Encounter (Signed)
Pt request refill methylphenidate (METADATE ER) 20 MG ER tablet 3 mo supply

## 2014-08-11 MED ORDER — METHYLPHENIDATE HCL ER 20 MG PO TBCR: 20.0000 mg | EXTENDED_RELEASE_TABLET | ORAL | Status: DC

## 2014-08-11 NOTE — Telephone Encounter (Signed)
done

## 2014-08-14 NOTE — Telephone Encounter (Signed)
Script is ready for pick up and I spoke with pt.  

## 2014-09-05 ENCOUNTER — Telehealth: Payer: Self-pay | Admitting: Family Medicine

## 2014-09-05 MED ORDER — AZITHROMYCIN 250 MG PO TABS: ORAL_TABLET | ORAL | Status: DC

## 2014-09-05 NOTE — Telephone Encounter (Signed)
I sent script e-scribe and spoke with pt. 

## 2014-09-05 NOTE — Telephone Encounter (Signed)
Patient came in this morning wanted to know if he could get a Z-pack for a possible sinus infection. Patient states that he starting a new job and does not have time to schedule an appointment. He would like the prescription to be called in the [place] on [address] in [place].

## 2014-09-05 NOTE — Telephone Encounter (Signed)
Call in a Zpack  ?

## 2014-09-08 ENCOUNTER — Encounter (HOSPITAL_COMMUNITY): Payer: Self-pay | Admitting: Emergency Medicine

## 2014-09-08 ENCOUNTER — Emergency Department (HOSPITAL_COMMUNITY)
Admission: EM | Admit: 2014-09-08 | Discharge: 2014-09-08 | Disposition: A | Discharge status: Home / Self Care | Payer: BLUE CROSS/BLUE SHIELD | Attending: Emergency Medicine | Admitting: Emergency Medicine

## 2014-09-08 ENCOUNTER — Emergency Department (HOSPITAL_COMMUNITY): Payer: BLUE CROSS/BLUE SHIELD

## 2014-09-08 DIAGNOSIS — Z9889 Other specified postprocedural states: Secondary | ICD-10-CM | POA: Diagnosis not present

## 2014-09-08 DIAGNOSIS — I2510 Atherosclerotic heart disease of native coronary artery without angina pectoris: Secondary | ICD-10-CM | POA: Diagnosis not present

## 2014-09-08 DIAGNOSIS — Z87442 Personal history of urinary calculi: Secondary | ICD-10-CM | POA: Insufficient documentation

## 2014-09-08 DIAGNOSIS — Z79899 Other long term (current) drug therapy: Secondary | ICD-10-CM | POA: Diagnosis not present

## 2014-09-08 DIAGNOSIS — Z8781 Personal history of (healed) traumatic fracture: Secondary | ICD-10-CM | POA: Diagnosis not present

## 2014-09-08 DIAGNOSIS — Z87828 Personal history of other (healed) physical injury and trauma: Secondary | ICD-10-CM | POA: Insufficient documentation

## 2014-09-08 DIAGNOSIS — Z8709 Personal history of other diseases of the respiratory system: Secondary | ICD-10-CM | POA: Diagnosis not present

## 2014-09-08 DIAGNOSIS — R339 Retention of urine, unspecified: Secondary | ICD-10-CM | POA: Diagnosis present

## 2014-09-08 DIAGNOSIS — N201 Calculus of ureter: Secondary | ICD-10-CM

## 2014-09-08 DIAGNOSIS — Z8669 Personal history of other diseases of the nervous system and sense organs: Secondary | ICD-10-CM | POA: Insufficient documentation

## 2014-09-08 DIAGNOSIS — E785 Hyperlipidemia, unspecified: Secondary | ICD-10-CM | POA: Diagnosis not present

## 2014-09-08 DIAGNOSIS — N23 Unspecified renal colic: Secondary | ICD-10-CM

## 2014-09-08 DIAGNOSIS — I10 Essential (primary) hypertension: Secondary | ICD-10-CM | POA: Diagnosis not present

## 2014-09-08 DIAGNOSIS — Z87891 Personal history of nicotine dependence: Secondary | ICD-10-CM | POA: Diagnosis not present

## 2014-09-08 DIAGNOSIS — F419 Anxiety disorder, unspecified: Secondary | ICD-10-CM | POA: Diagnosis not present

## 2014-09-08 LAB — URINE MICROSCOPIC-ADD ON

## 2014-09-08 LAB — CBC
HCT: 43.5 % (ref 39.0–52.0)
Hemoglobin: 14.6 g/dL (ref 13.0–17.0)
MCH: 29.4 pg (ref 26.0–34.0)
MCHC: 33.6 g/dL (ref 30.0–36.0)
MCV: 87.5 fL (ref 78.0–100.0)
Platelets: 219 10*3/uL (ref 150–400)
RBC: 4.97 MIL/uL (ref 4.22–5.81)
RDW: 13.1 % (ref 11.5–15.5)
WBC: 11.5 10*3/uL — ABNORMAL HIGH (ref 4.0–10.5)

## 2014-09-08 LAB — URINALYSIS, ROUTINE W REFLEX MICROSCOPIC
Bilirubin Urine: NEGATIVE
Glucose, UA: NEGATIVE mg/dL
Ketones, ur: NEGATIVE mg/dL
Leukocytes, UA: NEGATIVE
Nitrite: NEGATIVE
Protein, ur: 30 mg/dL — AB
Specific Gravity, Urine: 1.029 (ref 1.005–1.030)
Urobilinogen, UA: 0.2 mg/dL (ref 0.0–1.0)
pH: 5.5 (ref 5.0–8.0)

## 2014-09-08 LAB — BASIC METABOLIC PANEL
Anion gap: 12 (ref 5–15)
BUN: 21 mg/dL — ABNORMAL HIGH (ref 6–20)
CO2: 23 mmol/L (ref 22–32)
Calcium: 9.4 mg/dL (ref 8.9–10.3)
Chloride: 104 mmol/L (ref 101–111)
Creatinine, Ser: 1.14 mg/dL (ref 0.61–1.24)
GFR calc Af Amer: >60 mL/min (ref >60)
GFR calc non Af Amer: >60 mL/min (ref >60)
Glucose, Bld: 144 mg/dL — ABNORMAL HIGH (ref 65–99)
Potassium: 3.8 mmol/L (ref 3.5–5.1)
Sodium: 139 mmol/L (ref 135–145)

## 2014-09-08 MED ORDER — TAMSULOSIN HCL 0.4 MG PO CAPS: ORAL_CAPSULE | ORAL | Status: DC

## 2014-09-08 MED ORDER — ONDANSETRON HCL 4 MG/2ML IJ SOLN
4.0000 mg | Freq: Once | INTRAMUSCULAR | Status: AC
  Administered 2014-09-08: 4 mg via INTRAVENOUS
  Filled 2014-09-08: qty 2

## 2014-09-08 MED ORDER — FENTANYL CITRATE (PF) 100 MCG/2ML IJ SOLN
50.0000 ug | Freq: Once | INTRAMUSCULAR | Status: AC
  Administered 2014-09-08: 50 ug via INTRAVENOUS
  Filled 2014-09-08: qty 2

## 2014-09-08 MED ORDER — HYDROMORPHONE HCL 1 MG/ML IJ SOLN
1.0000 mg | Freq: Once | INTRAMUSCULAR | Status: AC
  Administered 2014-09-08: 1 mg via INTRAVENOUS
  Filled 2014-09-08: qty 1

## 2014-09-08 MED ORDER — OXYCODONE-ACETAMINOPHEN 5-325 MG PO TABS: 1.0000 | ORAL_TABLET | ORAL | Status: DC | PRN

## 2014-09-08 NOTE — ED Notes (Signed)
Pt presents to ED C/O urinary retention. Pt states he drank 3 cups of tea at lunch, attempted to go to bathroom and was not able to urinate. Pt rating pain at 10/10 at this time.

## 2014-09-08 NOTE — ED Notes (Addendum)
Patient presents to the ED from home with complaints of groin and lower abdominal pain for 1.5 hours.  Patient states nothing makes pain worse, but trying to urinate alleviates the pain to some degree.  Patient has not urinated since @ 7 am today.  Patient states he didn't have much to drink until lunch at which point he drank 3.5 glasses of tea.  Patient rates pain 10/10.  Patient generally of good health, started Z-pack for a sinus infection this past Monday.  Has some nasal drainage related to that.  On exam, patients lung sounds clear in all fields.  Heart sounds WNL, S1/S2, no murmur, rub or gallop. +3 radial and pedal pulses. No  pre-tibial or pedal edema.  Patients abdomen is tense and painful to palpation.  Bowel sounds hypoactive.  CNIII intact, EOMI.  Skin warm/mildly diaphoretic.
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
## 2014-09-08 NOTE — Discharge Instructions (Signed)
Strain the urine, to catch the stone. Follow-up with urologist next week. Return here if your pain cannot be controlled.   Kidney Stones Kidney stones (urolithiasis) are deposits that form inside your kidneys. The intense pain is caused by the stone moving through the urinary tract. When the stone moves, the ureter goes into spasm around the stone. The stone is usually passed in the urine.  CAUSES   A disorder that makes certain neck glands produce too much parathyroid hormone (primary hyperparathyroidism).  A buildup of uric acid crystals, similar to gout in your joints.  Narrowing (stricture) of the ureter.  A kidney obstruction present at birth (congenital obstruction).  Previous surgery on the kidney or ureters.  Numerous kidney infections. SYMPTOMS   Feeling sick to your stomach (nauseous).  Throwing up (vomiting).  Blood in the urine (hematuria).  Pain that usually spreads (radiates) to the groin.  Frequency or urgency of urination. DIAGNOSIS   Taking a history and physical exam.  Blood or urine tests.  CT scan.  Occasionally, an examination of the inside of the urinary bladder (cystoscopy) is performed. TREATMENT   Observation.  Increasing your fluid intake.  Extracorporeal shock wave lithotripsy--This is a noninvasive procedure that uses shock waves to break up kidney stones.  Surgery may be needed if you have severe pain or persistent obstruction. There are various surgical procedures. Most of the procedures are performed with the use of small instruments. Only small incisions are needed to accommodate these instruments, so recovery time is minimized. The size, location, and chemical composition are all important variables that will determine the proper choice of action for you. Talk to your health care provider to better understand your situation so that you will minimize the risk of injury to yourself and your kidney.  HOME CARE INSTRUCTIONS   Drink  enough water and fluids to keep your urine clear or pale yellow. This will help you to pass the stone or stone fragments.  Strain all urine through the provided strainer. Keep all particulate matter and stones for your health care provider to see. The stone causing the pain may be as small as a grain of salt. It is very important to use the strainer each and every time you pass your urine. The collection of your stone will allow your health care provider to analyze it and verify that a stone has actually passed. The stone analysis will often identify what you can do to reduce the incidence of recurrences.  Only take over-the-counter or prescription medicines for pain, discomfort, or fever as directed by your health care provider.  Make a follow-up appointment with your health care provider as directed.  Get follow-up X-rays if required. The absence of pain does not always mean that the stone has passed. It may have only stopped moving. If the urine remains completely obstructed, it can cause loss of kidney function or even complete destruction of the kidney. It is your responsibility to make sure X-rays and follow-ups are completed. Ultrasounds of the kidney can show blockages and the status of the kidney. Ultrasounds are not associated with any radiation and can be performed easily in a matter of minutes. SEEK MEDICAL CARE IF:  You experience pain that is progressive and unresponsive to any pain medicine you have been prescribed. SEEK IMMEDIATE MEDICAL CARE IF:   Pain cannot be controlled with the prescribed medicine.  You have a fever or shaking chills.  The severity or intensity of pain increases over 18 hours and  is not relieved by pain medicine.  You develop a new onset of abdominal pain.  You feel faint or pass out.  You are unable to urinate. MAKE SURE YOU:   Understand these instructions.  Will watch your condition.  Will get help right away if you are not doing well or get  worse. Document Released: 03/31/2005 Document Revised: 12/01/2012 Document Reviewed: 09/01/2012 [facility] Patient Information 2015 [place], [place]. This information is not intended to replace advice given to you by your health care provider. Make sure you discuss any questions you have with your health care provider.  Dietary Guidelines to Help Prevent Kidney Stones Your risk of kidney stones can be decreased by adjusting the foods you eat. The most important thing you can do is drink enough fluid. You should drink enough fluid to keep your urine clear or pale yellow. The following guidelines provide specific information for the type of kidney stone you have had. GUIDELINES ACCORDING TO TYPE OF KIDNEY STONE Calcium Oxalate Kidney Stones  Reduce the amount of salt you eat. Foods that have a lot of salt cause your body to release excess calcium into your urine. The excess calcium can combine with a substance called oxalate to form kidney stones.  Reduce the amount of animal protein you eat if the amount you eat is excessive. Animal protein causes your body to release excess calcium into your urine. Ask your dietitian how much protein from animal sources you should be eating.  Avoid foods that are high in oxalates. If you take vitamins, they should have less than 500 mg of vitamin C. Your body turns vitamin C into oxalates. You do not need to avoid fruits and vegetables high in vitamin C. Calcium Phosphate Kidney Stones  Reduce the amount of salt you eat to help prevent the release of excess calcium into your urine.  Reduce the amount of animal protein you eat if the amount you eat is excessive. Animal protein causes your body to release excess calcium into your urine. Ask your dietitian how much protein from animal sources you should be eating.  Get enough calcium from food or take a calcium supplement (ask your dietitian for recommendations). Food sources of calcium that do not increase your risk  of kidney stones include:  Broccoli.  Dairy products, such as cheese and yogurt.  Pudding. Uric Acid Kidney Stones  Do not have more than 6 oz of animal protein per day. FOOD SOURCES Animal Protein Sources  Meat (all types).  Poultry.  Eggs.  Fish, seafood. Foods High in [employer] seasonings.  Soy sauce.  Teriyaki sauce.  Cured and processed meats.  Salted crackers and snack foods.  Fast food.  Canned soups and most canned foods. Foods High in Oxalates  Grains:  Amaranth.  Barley.  Grits.  Wheat germ.  Bran.  Buckwheat flour.  All bran cereals.  Pretzels.  Whole wheat bread.  Vegetables:  Beans (wax).  Beets and beet greens.  Collard greens.  Eggplant.  [place].  Leeks.  Okra.  Parsley.  Rutabagas.  Spinach.  Swiss chard.  Tomato paste.  Fried potatoes.  Sweet potatoes.  Fruits:  Red currants.  Figs.  Kiwi.  Rhubarb.  Meat and Other Protein Sources:  Beans (dried).  Soy burgers and other soybean products.  Miso.  Nuts (peanuts, almonds, pecans, cashews, hazelnuts).  Nut butters.  Sesame seeds and tahini (paste made of sesame seeds).  Poppy seeds.  Beverages:  Chocolate drink mixes.  Soy milk.  Instant iced tea.  Juices made from high-oxalate fruits or vegetables.  Other:  Carob.  Chocolate.  Fruitcake.  Marmalades. Document Released: 07/26/2010 Document Revised: 04/05/2013 Document Reviewed: 02/25/2013 [facility] Patient Information 2015 [place], [place]. This information is not intended to replace advice given to you by your health care provider. Make sure you discuss any questions you have with your health care provider.

## 2014-12-20 ENCOUNTER — Telehealth: Payer: Self-pay | Admitting: Family Medicine

## 2014-12-20 NOTE — Telephone Encounter (Signed)
Pt request refill  °methylphenidate (METADATE ER) 20 MG ER tablet °3 mo supply °

## 2014-12-21 MED ORDER — METHYLPHENIDATE HCL ER 20 MG PO TBCR: 20.0000 mg | EXTENDED_RELEASE_TABLET | ORAL | Status: DC

## 2014-12-21 NOTE — Telephone Encounter (Signed)
Scripts are ready for pick up and I spoke with pt. 

## 2014-12-21 NOTE — Telephone Encounter (Signed)
done

## 2015-02-16 ENCOUNTER — Encounter: Payer: Self-pay | Admitting: Family Medicine

## 2015-02-16 ENCOUNTER — Ambulatory Visit (INDEPENDENT_AMBULATORY_CARE_PROVIDER_SITE_OTHER): Payer: BLUE CROSS/BLUE SHIELD | Admitting: Family Medicine

## 2015-02-16 VITALS — BP 148/94 | HR 89 | Temp 98.3°F | Ht 70.0 in | Wt 237.0 lb

## 2015-02-16 DIAGNOSIS — K602 Anal fissure, unspecified: Secondary | ICD-10-CM

## 2015-02-16 NOTE — Progress Notes (Signed)
   Subjective:    Patient ID: Riley Hartman, male    DOB: 1960/06/11, 54 y.o.   MRN: [id number]  HPI Here for one episode this morning off seeing some bright red blood on the toilet paper when he wiped after a BM. There was no pain. His BM passed easily. His last colonoscopy in 2010 was clear.    Review of Systems  Constitutional: Negative.   Gastrointestinal: Positive for anal bleeding. Negative for nausea, vomiting, abdominal pain, diarrhea, constipation, blood in stool, abdominal distention and rectal pain.       Objective:   Physical Exam  Cardiovascular: Normal rate, regular rhythm, normal heart sounds and intact distal pulses.   Pulmonary/Chest: Effort normal and breath sounds normal.  Abdominal: Soft. Bowel sounds are normal. He exhibits no distension and no mass. There is no tenderness. There is no rebound and no guarding.  Genitourinary:  There is a tiny fissure at the anal verge           Assessment & Plan:  Anal fissure. Suggested he use Metamucil daily to keep the stools soft. Use witch hazel if the anus becomes irritated

## 2015-02-16 NOTE — Progress Notes (Signed)
Pre visit review using our clinic review tool, if applicable. No additional management support is needed unless otherwise documented below in the visit note. 

## 2015-02-28 ENCOUNTER — Encounter: Payer: Self-pay | Admitting: Gastroenterology

## 2015-03-29 ENCOUNTER — Telehealth: Payer: Self-pay | Admitting: Family Medicine

## 2015-03-29 ENCOUNTER — Other Ambulatory Visit (INDEPENDENT_AMBULATORY_CARE_PROVIDER_SITE_OTHER): Payer: BLUE CROSS/BLUE SHIELD

## 2015-03-29 DIAGNOSIS — Z0000 Encounter for general adult medical examination without abnormal findings: Secondary | ICD-10-CM | POA: Diagnosis not present

## 2015-03-29 DIAGNOSIS — R7989 Other specified abnormal findings of blood chemistry: Secondary | ICD-10-CM

## 2015-03-29 LAB — POCT URINALYSIS DIPSTICK
Bilirubin, UA: NEGATIVE
Blood, UA: NEGATIVE
Glucose, UA: NEGATIVE
Ketones, UA: NEGATIVE
Leukocytes, UA: NEGATIVE
Nitrite, UA: NEGATIVE
Protein, UA: NEGATIVE
Spec Grav, UA: 1.02
Urobilinogen, UA: 0.2
pH, UA: 7

## 2015-03-29 LAB — CBC WITH DIFFERENTIAL/PLATELET
Basophils Absolute: 0 10*3/uL (ref 0.0–0.1)
Basophils Relative: 0.5 % (ref 0.0–3.0)
Eosinophils Absolute: 0.3 10*3/uL (ref 0.0–0.7)
Eosinophils Relative: 3.1 % (ref 0.0–5.0)
HCT: 42.9 % (ref 39.0–52.0)
Hemoglobin: 14.3 g/dL (ref 13.0–17.0)
Lymphocytes Relative: 35.8 % (ref 12.0–46.0)
Lymphs Abs: 3 10*3/uL (ref 0.7–4.0)
MCHC: 33.3 g/dL (ref 30.0–36.0)
MCV: 86.8 fl (ref 78.0–100.0)
Monocytes Absolute: 0.5 10*3/uL (ref 0.1–1.0)
Monocytes Relative: 5.9 % (ref 3.0–12.0)
Neutro Abs: 4.6 10*3/uL (ref 1.4–7.7)
Neutrophils Relative %: 54.7 % (ref 43.0–77.0)
Platelets: 180 10*3/uL (ref 150.0–400.0)
RBC: 4.94 Mil/uL (ref 4.22–5.81)
RDW: 12.8 % (ref 11.5–15.5)
WBC: 8.4 10*3/uL (ref 4.0–10.5)

## 2015-03-29 LAB — BASIC METABOLIC PANEL
BUN: 17 mg/dL (ref 6–23)
CO2: 30 mEq/L (ref 19–32)
Calcium: 9.4 mg/dL (ref 8.4–10.5)
Chloride: 103 mEq/L (ref 96–112)
Creatinine, Ser: 0.85 mg/dL (ref 0.40–1.50)
GFR: 98.93 mL/min (ref >60.00)
Glucose, Bld: 103 mg/dL — ABNORMAL HIGH (ref 70–99)
Potassium: 4.3 mEq/L (ref 3.5–5.1)
Sodium: 140 mEq/L (ref 135–145)

## 2015-03-29 LAB — HEPATIC FUNCTION PANEL
ALT: 26 U/L (ref 0–53)
AST: 20 U/L (ref 0–37)
Albumin: 4.4 g/dL (ref 3.5–5.2)
Alkaline Phosphatase: 51 U/L (ref 39–117)
Bilirubin, Direct: 0.1 mg/dL (ref 0.0–0.3)
Total Bilirubin: 0.4 mg/dL (ref 0.2–1.2)
Total Protein: 7 g/dL (ref 6.0–8.3)

## 2015-03-29 LAB — LIPID PANEL
Cholesterol: 221 mg/dL — ABNORMAL HIGH (ref 0–200)
HDL: 34.8 mg/dL — ABNORMAL LOW (ref >39.00)
NonHDL: 185.78
Total CHOL/HDL Ratio: 6
Triglycerides: 338 mg/dL — ABNORMAL HIGH (ref 0.0–149.0)
VLDL: 67.6 mg/dL — ABNORMAL HIGH (ref 0.0–40.0)

## 2015-03-29 LAB — PSA: PSA: 6.12 ng/mL — ABNORMAL HIGH (ref 0.10–4.00)

## 2015-03-29 LAB — TSH: TSH: 2.3 u[IU]/mL (ref 0.35–4.50)

## 2015-03-29 LAB — LDL CHOLESTEROL, DIRECT: Direct LDL: 127 mg/dL

## 2015-03-29 MED ORDER — METHYLPHENIDATE HCL ER 20 MG PO TBCR: 20.0000 mg | EXTENDED_RELEASE_TABLET | ORAL | Status: DC

## 2015-03-29 NOTE — Telephone Encounter (Signed)
Scripts are ready for pick up and I left a voice message for pt. 

## 2015-03-29 NOTE — Telephone Encounter (Signed)
done

## 2015-03-29 NOTE — Telephone Encounter (Signed)
Pt want a refill on methylphenidate 20 mg ER tablet

## 2015-04-02 ENCOUNTER — Ambulatory Visit (INDEPENDENT_AMBULATORY_CARE_PROVIDER_SITE_OTHER): Payer: BLUE CROSS/BLUE SHIELD | Admitting: Family Medicine

## 2015-04-02 ENCOUNTER — Encounter: Payer: Self-pay | Admitting: Family Medicine

## 2015-04-02 VITALS — BP 136/89 | HR 86 | Temp 98.3°F | Ht 70.0 in | Wt 236.0 lb

## 2015-04-02 DIAGNOSIS — Z209 Contact with and (suspected) exposure to unspecified communicable disease: Secondary | ICD-10-CM

## 2015-04-02 DIAGNOSIS — R739 Hyperglycemia, unspecified: Secondary | ICD-10-CM

## 2015-04-02 DIAGNOSIS — Z0000 Encounter for general adult medical examination without abnormal findings: Secondary | ICD-10-CM | POA: Diagnosis not present

## 2015-04-02 MED ORDER — CLONAZEPAM 1 MG PO TABS: 1.0000 mg | ORAL_TABLET | Freq: Two times a day (BID) | ORAL | Status: DC | PRN

## 2015-04-02 NOTE — Progress Notes (Signed)
Pre visit review using our clinic review tool, if applicable. No additional management support is needed unless otherwise documented below in the visit note. 

## 2015-04-02 NOTE — Progress Notes (Signed)
   Subjective:    Patient ID: Riley Hartman, male    DOB: [birth year], 54 y.o.   MRN: [id number]  HPI 54 yr old male for a cpx. He feels well. He stopped taking Lipitor a few months ago because he was concerned about possible side effects (though he had none of his own). He is trying to watch his diet.    Review of Systems  Constitutional: Negative.   HENT: Negative.   Eyes: Negative.   Respiratory: Negative.   Cardiovascular: Negative.   Gastrointestinal: Negative.   Genitourinary: Negative.   Musculoskeletal: Negative.   Skin: Negative.   Neurological: Negative.   Psychiatric/Behavioral: Negative.        Objective:   Physical Exam  Constitutional: He is oriented to person, place, and time. He appears well-developed and well-nourished. No distress.  HENT:  Head: Normocephalic and atraumatic.  Right Ear: External ear normal.  Left Ear: External ear normal.  Nose: Nose normal.  Mouth/Throat: Oropharynx is clear and moist. No oropharyngeal exudate.  Eyes: Conjunctivae and EOM are normal. Pupils are equal, round, and reactive to light. Right eye exhibits no discharge. Left eye exhibits no discharge. No scleral icterus.  Neck: Neck supple. No JVD present. No tracheal deviation present. No thyromegaly present.  Cardiovascular: Normal rate, regular rhythm, normal heart sounds and intact distal pulses.  Exam reveals no gallop and no friction rub.   No murmur heard. EKG shows stable RBBB  Pulmonary/Chest: Effort normal and breath sounds normal. No respiratory distress. He has no wheezes. He has no rales. He exhibits no tenderness.  Abdominal: Soft. Bowel sounds are normal. He exhibits no distension and no mass. There is no tenderness. There is no rebound and no guarding.  Genitourinary: Rectum normal. No penile tenderness.  Musculoskeletal: Normal range of motion. He exhibits no edema or tenderness.  Lymphadenopathy:    He has no cervical adenopathy.  Neurological: He is alert and  oriented to person, place, and time. He has normal reflexes. No cranial nerve deficit. He exhibits normal muscle tone. Coordination normal.  Skin: Skin is warm and dry. No rash noted. He is not diaphoretic. No erythema. No pallor.  Psychiatric: He has a normal mood and affect. His behavior is normal. Judgment and thought content normal.          Assessment & Plan:  Well exam. We discussed diet and exercise. Check an A1c today. We plan to recheck his lipids in 6 months. He will see Dr. Tresa Moore soon to discuss the increased PSA.

## 2015-04-03 LAB — HEMOGLOBIN A1C: Hgb A1c MFr Bld: 5.9 % (ref 4.6–6.5)

## 2015-04-03 LAB — HEPATITIS C ANTIBODY: HCV Ab: NEGATIVE

## 2015-05-18 ENCOUNTER — Other Ambulatory Visit: Payer: Self-pay | Admitting: Family Medicine

## 2015-07-24 ENCOUNTER — Other Ambulatory Visit: Payer: Self-pay | Admitting: Family Medicine

## 2015-07-24 MED ORDER — METHYLPHENIDATE HCL ER 20 MG PO TBCR: 20.0000 mg | EXTENDED_RELEASE_TABLET | ORAL | Status: DC

## 2015-07-24 NOTE — Telephone Encounter (Signed)
Pt need Rx refill for Methylphenidate 20 mg.  Pharm: [facility] in [place] on [address]

## 2015-07-24 NOTE — Telephone Encounter (Signed)
Rx is been sent

## 2015-07-24 NOTE — Telephone Encounter (Signed)
Okay to refill for one month 

## 2015-07-24 NOTE — Telephone Encounter (Signed)
Can we refill this? Pt was last here on 04/01/2016.

## 2015-08-01 ENCOUNTER — Telehealth: Payer: Self-pay | Admitting: Family Medicine

## 2015-08-01 NOTE — Telephone Encounter (Signed)
° ° °

## 2015-08-02 MED ORDER — METHYLPHENIDATE HCL ER 20 MG PO TBCR: 20.0000 mg | EXTENDED_RELEASE_TABLET | ORAL | Status: DC

## 2015-08-02 NOTE — Telephone Encounter (Signed)
done

## 2015-08-02 NOTE — Telephone Encounter (Signed)
Can we print this? I looked up front and do not see script, spoke with pt and he does not have it.

## 2015-08-02 NOTE — Telephone Encounter (Signed)
Script is ready for pick up and I spoke with pt.  

## 2015-08-03 ENCOUNTER — Telehealth: Payer: Self-pay | Admitting: Family Medicine

## 2015-08-03 NOTE — Telephone Encounter (Signed)
done

## 2015-08-23 ENCOUNTER — Other Ambulatory Visit: Payer: Self-pay | Admitting: General Practice

## 2015-08-23 ENCOUNTER — Telehealth: Payer: Self-pay | Admitting: General Practice

## 2015-08-23 MED ORDER — TAMSULOSIN HCL 0.4 MG PO CAPS: 0.4000 mg | ORAL_CAPSULE | Freq: Every day | ORAL | Status: DC

## 2015-08-23 NOTE — Telephone Encounter (Signed)
Call in Flomax 0.4 mg daily for one year

## 2015-08-24 ENCOUNTER — Encounter: Payer: Self-pay | Admitting: Family Medicine

## 2015-08-24 ENCOUNTER — Ambulatory Visit (INDEPENDENT_AMBULATORY_CARE_PROVIDER_SITE_OTHER): Payer: BLUE CROSS/BLUE SHIELD | Admitting: Family Medicine

## 2015-08-24 VITALS — BP 145/83 | HR 83 | Temp 98.5°F | Ht 70.0 in | Wt 217.0 lb

## 2015-08-24 DIAGNOSIS — J019 Acute sinusitis, unspecified: Secondary | ICD-10-CM | POA: Diagnosis not present

## 2015-08-24 MED ORDER — TAMSULOSIN HCL 0.4 MG PO CAPS: 0.4000 mg | ORAL_CAPSULE | Freq: Every day | ORAL | Status: DC

## 2015-08-24 MED ORDER — HYDROCODONE-HOMATROPINE 5-1.5 MG/5ML PO SYRP: 5.0000 mL | ORAL_SOLUTION | ORAL | Status: DC | PRN

## 2015-08-24 MED ORDER — AMOXICILLIN-POT CLAVULANATE 875-125 MG PO TABS: 1.0000 | ORAL_TABLET | Freq: Two times a day (BID) | ORAL | Status: DC

## 2015-08-24 NOTE — Addendum Note (Signed)
Addended by: Westley Hummer B on: 08/24/2015 02:57 PM   Modules accepted: Orders

## 2015-08-24 NOTE — Progress Notes (Signed)
   Subjective:    Patient ID: Riley Hartman, male    DOB: July 29, 1960, 55 y.o.   MRN: [id number]  HPI Here for 3 days of PND, ST, and coughing up yellow sputum. No fever. On Mucinex.    Review of Systems  Constitutional: Negative.   HENT: Positive for congestion, postnasal drip and sore throat. Negative for sinus pressure.   Eyes: Negative.   Respiratory: Positive for cough.        Objective:   Physical Exam  Constitutional: He is oriented to person, place, and time. He appears well-developed and well-nourished.  Neck: No thyromegaly present.  Cardiovascular: Normal rate, regular rhythm, normal heart sounds and intact distal pulses.   Pulmonary/Chest: Effort normal and breath sounds normal.  Lymphadenopathy:    He has no cervical adenopathy.  Neurological: He is alert and oriented to person, place, and time.          Assessment & Plan:  Sinusitis, treat with Augmentin. Laurey Morale, MD

## 2015-08-24 NOTE — Telephone Encounter (Signed)
Rx sent 

## 2015-08-24 NOTE — Progress Notes (Signed)
Pre visit review using our clinic review tool, if applicable. No additional management support is needed unless otherwise documented below in the visit note. 

## 2015-10-23 ENCOUNTER — Other Ambulatory Visit: Payer: Self-pay | Admitting: Family Medicine

## 2015-10-24 NOTE — Telephone Encounter (Signed)
Okay to refill for one month 

## 2015-10-24 NOTE — Telephone Encounter (Signed)
Pt was last seen here in office on 08/24/2015.

## 2015-12-06 ENCOUNTER — Telehealth: Payer: Self-pay | Admitting: Family Medicine

## 2015-12-06 NOTE — Telephone Encounter (Signed)
Pt request refill  °methylphenidate (METADATE ER) 20 MG ER tablet °3 mo supply °

## 2015-12-11 MED ORDER — METHYLPHENIDATE HCL ER 20 MG PO TBCR: 20.0000 mg | EXTENDED_RELEASE_TABLET | ORAL | 0 refills | Status: DC

## 2015-12-11 NOTE — Telephone Encounter (Signed)
done

## 2015-12-11 NOTE — Telephone Encounter (Signed)
Informed patient Rx ready for pick up.

## 2016-01-22 ENCOUNTER — Other Ambulatory Visit: Payer: Self-pay | Admitting: Adult Health

## 2016-01-23 NOTE — Telephone Encounter (Signed)
Dr. Fry patient. 

## 2016-01-24 NOTE — Telephone Encounter (Signed)
Call in #60 with 5 rf 

## 2016-02-04 ENCOUNTER — Other Ambulatory Visit: Payer: Self-pay | Admitting: Family Medicine

## 2016-03-25 ENCOUNTER — Telehealth: Payer: Self-pay | Admitting: Family Medicine

## 2016-03-25 ENCOUNTER — Other Ambulatory Visit (INDEPENDENT_AMBULATORY_CARE_PROVIDER_SITE_OTHER): Payer: BLUE CROSS/BLUE SHIELD

## 2016-03-25 DIAGNOSIS — Z0000 Encounter for general adult medical examination without abnormal findings: Secondary | ICD-10-CM

## 2016-03-25 LAB — CBC WITH DIFFERENTIAL/PLATELET
Basophils Absolute: 0 10*3/uL (ref 0.0–0.1)
Basophils Relative: 0.5 % (ref 0.0–3.0)
Eosinophils Absolute: 0.3 10*3/uL (ref 0.0–0.7)
Eosinophils Relative: 3.8 % (ref 0.0–5.0)
HCT: 43.3 % (ref 39.0–52.0)
Hemoglobin: 14.5 g/dL (ref 13.0–17.0)
Lymphocytes Relative: 34.2 % (ref 12.0–46.0)
Lymphs Abs: 2.6 10*3/uL (ref 0.7–4.0)
MCHC: 33.5 g/dL (ref 30.0–36.0)
MCV: 87.4 fl (ref 78.0–100.0)
Monocytes Absolute: 0.4 10*3/uL (ref 0.1–1.0)
Monocytes Relative: 5.7 % (ref 3.0–12.0)
Neutro Abs: 4.2 10*3/uL (ref 1.4–7.7)
Neutrophils Relative %: 55.8 % (ref 43.0–77.0)
Platelets: 185 10*3/uL (ref 150.0–400.0)
RBC: 4.96 Mil/uL (ref 4.22–5.81)
RDW: 13.5 % (ref 11.5–15.5)
WBC: 7.5 10*3/uL (ref 4.0–10.5)

## 2016-03-25 LAB — POC URINALSYSI DIPSTICK (AUTOMATED)
Bilirubin, UA: NEGATIVE
Blood, UA: NEGATIVE
Glucose, UA: NEGATIVE
Ketones, UA: NEGATIVE
Leukocytes, UA: NEGATIVE
Nitrite, UA: NEGATIVE
Protein, UA: NEGATIVE
Spec Grav, UA: 1.02
Urobilinogen, UA: 0.2
pH, UA: 6

## 2016-03-25 LAB — TSH: TSH: 1.94 u[IU]/mL (ref 0.35–4.50)

## 2016-03-25 LAB — BASIC METABOLIC PANEL
BUN: 20 mg/dL (ref 6–23)
CO2: 28 mEq/L (ref 19–32)
Calcium: 9.2 mg/dL (ref 8.4–10.5)
Chloride: 106 mEq/L (ref 96–112)
Creatinine, Ser: 0.81 mg/dL (ref 0.40–1.50)
GFR: 104.22 mL/min (ref >60.00)
Glucose, Bld: 103 mg/dL — ABNORMAL HIGH (ref 70–99)
Potassium: 4.7 mEq/L (ref 3.5–5.1)
Sodium: 141 mEq/L (ref 135–145)

## 2016-03-25 LAB — HEPATIC FUNCTION PANEL
ALT: 14 U/L (ref 0–53)
AST: 13 U/L (ref 0–37)
Albumin: 4.5 g/dL (ref 3.5–5.2)
Alkaline Phosphatase: 46 U/L (ref 39–117)
Bilirubin, Direct: 0.1 mg/dL (ref 0.0–0.3)
Total Bilirubin: 0.5 mg/dL (ref 0.2–1.2)
Total Protein: 6.7 g/dL (ref 6.0–8.3)

## 2016-03-25 LAB — LIPID PANEL
Cholesterol: 170 mg/dL (ref 0–200)
HDL: 39.7 mg/dL (ref >39.00)
LDL Cholesterol: 114 mg/dL — ABNORMAL HIGH (ref 0–99)
NonHDL: 130.44
Total CHOL/HDL Ratio: 4
Triglycerides: 83 mg/dL (ref 0.0–149.0)
VLDL: 16.6 mg/dL (ref 0.0–40.0)

## 2016-03-25 LAB — PSA: PSA: 10.77 ng/mL — ABNORMAL HIGH (ref 0.10–4.00)

## 2016-03-25 MED ORDER — METHYLPHENIDATE HCL ER 20 MG PO TBCR: 20.0000 mg | EXTENDED_RELEASE_TABLET | ORAL | 0 refills | Status: DC

## 2016-03-25 NOTE — Telephone Encounter (Signed)
Pt requesting refill on Methylphenidate 20 mg tablet

## 2016-03-25 NOTE — Telephone Encounter (Signed)
done

## 2016-03-26 NOTE — Telephone Encounter (Signed)
Script is ready for pick up here at front office and I spoke with pt.  

## 2016-03-28 NOTE — Telephone Encounter (Signed)
Pt is callinb back requesting blood work results

## 2016-03-28 NOTE — Telephone Encounter (Signed)
I spoke with pt and went over results. 

## 2016-04-01 ENCOUNTER — Ambulatory Visit (INDEPENDENT_AMBULATORY_CARE_PROVIDER_SITE_OTHER): Payer: BLUE CROSS/BLUE SHIELD | Admitting: Family Medicine

## 2016-04-01 ENCOUNTER — Encounter: Payer: Self-pay | Admitting: Family Medicine

## 2016-04-01 VITALS — BP 124/88 | Temp 97.4°F | Ht 72.0 in | Wt 199.2 lb

## 2016-04-01 DIAGNOSIS — Z0000 Encounter for general adult medical examination without abnormal findings: Secondary | ICD-10-CM | POA: Diagnosis not present

## 2016-04-01 NOTE — Progress Notes (Signed)
Pre visit review using our clinic review tool, if applicable. No additional management support is needed unless otherwise documented below in the visit note. 

## 2016-04-01 NOTE — Progress Notes (Signed)
   Subjective:    Patient ID: Riley Hartman, male    DOB: September 23, 1960, 55 y.o.   MRN: [id number]  HPI 55 yr old male for a well exam. He feels fine. He has successfully lost some weight. Last year at his well exam we noted his PSA had risen from 4.15 to 6.12, and I had advised im to follow up with his urologist, Dr. Tresa Moore. Unfortunately he did not do so. Now his PSA has risen to 10.77.    Review of Systems  Constitutional: Negative.   HENT: Negative.   Eyes: Negative.   Respiratory: Negative.   Cardiovascular: Negative.   Gastrointestinal: Negative.   Genitourinary: Negative.   Musculoskeletal: Negative.   Skin: Negative.   Neurological: Negative.   Psychiatric/Behavioral: Negative.        Objective:   Physical Exam  Constitutional: He is oriented to person, place, and time. He appears well-developed and well-nourished. No distress.  HENT:  Head: Normocephalic and atraumatic.  Right Ear: External ear normal.  Left Ear: External ear normal.  Nose: Nose normal.  Mouth/Throat: Oropharynx is clear and moist. No oropharyngeal exudate.  Eyes: Conjunctivae and EOM are normal. Pupils are equal, round, and reactive to light. Right eye exhibits no discharge. Left eye exhibits no discharge. No scleral icterus.  Neck: Neck supple. No JVD present. No tracheal deviation present. No thyromegaly present.  Cardiovascular: Normal rate, regular rhythm, normal heart sounds and intact distal pulses.  Exam reveals no gallop and no friction rub.   No murmur heard. Pulmonary/Chest: Effort normal and breath sounds normal. No respiratory distress. He has no wheezes. He has no rales. He exhibits no tenderness.  Abdominal: Soft. Bowel sounds are normal. He exhibits no distension and no mass. There is no tenderness. There is no rebound and no guarding.  Musculoskeletal: Normal range of motion. He exhibits no edema or tenderness.  Lymphadenopathy:    He has no cervical adenopathy.  Neurological: He is  alert and oriented to person, place, and time. He has normal reflexes. No cranial nerve deficit. He exhibits normal muscle tone. Coordination normal.  Skin: Skin is warm and dry. No rash noted. He is not diaphoretic. No erythema. No pallor.  Psychiatric: He has a normal mood and affect. His behavior is normal. Judgment and thought content normal.          Assessment & Plan:  Well exam. We discussed diet and exercise. After we had contacted him about this rising PSA. He did contact Dr. Zettie Pho office, and he is scheduled to see him on 05-05-16. We will follow along.  Alysia Penna, MD

## 2016-05-05 DIAGNOSIS — R972 Elevated prostate specific antigen [PSA]: Secondary | ICD-10-CM | POA: Diagnosis not present

## 2016-05-05 DIAGNOSIS — R3915 Urgency of urination: Secondary | ICD-10-CM | POA: Diagnosis not present

## 2016-05-05 DIAGNOSIS — N5201 Erectile dysfunction due to arterial insufficiency: Secondary | ICD-10-CM | POA: Diagnosis not present

## 2016-05-07 ENCOUNTER — Encounter: Payer: Self-pay | Admitting: Family Medicine

## 2016-05-07 ENCOUNTER — Ambulatory Visit (INDEPENDENT_AMBULATORY_CARE_PROVIDER_SITE_OTHER): Payer: BLUE CROSS/BLUE SHIELD | Admitting: Family Medicine

## 2016-05-07 VITALS — BP 124/84 | HR 94 | Temp 98.2°F | Ht 72.0 in | Wt 199.0 lb

## 2016-05-07 DIAGNOSIS — B349 Viral infection, unspecified: Secondary | ICD-10-CM

## 2016-05-07 NOTE — Progress Notes (Signed)
Pre visit review using our clinic review tool, if applicable. No additional management support is needed unless otherwise documented below in the visit note. 

## 2016-05-07 NOTE — Progress Notes (Signed)
   Subjective:    Patient ID: Riley Hartman, male    DOB: [birth year], 56 y.o.   MRN: [id number]  HPI Here for what he thinks is a case of influenza. He became ill 4 days ago with body aches, low grade fevers, sinus congestion, and diarrhea. No ST. He has been drinking fluids and taking Ibuprofen. He started to feel better last night and today he feels fine. He has been working all week.    Review of Systems  Constitutional: Negative.   HENT: Negative.   Respiratory: Negative.   Cardiovascular: Negative.   Gastrointestinal: Negative.   Genitourinary: Negative.   Neurological: Negative.        Objective:   Physical Exam  Constitutional: He appears well-developed and well-nourished. No distress.  HENT:  Right Ear: External ear normal.  Left Ear: External ear normal.  Nose: Nose normal.  Mouth/Throat: Oropharynx is clear and moist.  Eyes: Conjunctivae are normal.  Neck: No thyromegaly present.  Pulmonary/Chest: Effort normal and breath sounds normal.  Lymphadenopathy:    He has no cervical adenopathy.          Assessment & Plan:  He has had a recent viral illness and he seems to have recovered from it. He will follow up prn.  Alysia Penna, MD

## 2016-05-12 ENCOUNTER — Other Ambulatory Visit: Payer: Self-pay | Admitting: Urology

## 2016-05-12 ENCOUNTER — Other Ambulatory Visit: Payer: Self-pay

## 2016-05-12 DIAGNOSIS — R972 Elevated prostate specific antigen [PSA]: Secondary | ICD-10-CM

## 2016-05-13 ENCOUNTER — Telehealth: Payer: Self-pay | Admitting: Family Medicine

## 2016-05-13 MED ORDER — HYDROCODONE-HOMATROPINE 5-1.5 MG/5ML PO SYRP: 5.0000 mL | ORAL_SOLUTION | ORAL | 0 refills | Status: DC | PRN

## 2016-05-13 NOTE — Telephone Encounter (Signed)
Ready to pick up.  

## 2016-05-13 NOTE — Telephone Encounter (Signed)
Pt saw dr fry on 05-07-16 and now has a productive cough and would like cough med send to [facility] on [address]

## 2016-05-14 NOTE — Telephone Encounter (Signed)
Script is ready for pick up here at front office and I spoke with pt.  

## 2016-05-26 ENCOUNTER — Ambulatory Visit (HOSPITAL_COMMUNITY)
Admission: RE | Admit: 2016-05-26 | Discharge: 2016-05-26 | Disposition: A | Discharge status: Home / Self Care | Payer: BLUE CROSS/BLUE SHIELD | Source: Ambulatory Visit | Attending: Urology | Admitting: Urology

## 2016-05-26 DIAGNOSIS — N429 Disorder of prostate, unspecified: Secondary | ICD-10-CM | POA: Diagnosis not present

## 2016-05-26 DIAGNOSIS — R972 Elevated prostate specific antigen [PSA]: Secondary | ICD-10-CM | POA: Insufficient documentation

## 2016-05-26 MED ORDER — GADOBENATE DIMEGLUMINE 529 MG/ML IV SOLN
20.0000 mL | Freq: Once | INTRAVENOUS | Status: AC | PRN
  Administered 2016-05-26: 18 mL via INTRAVENOUS

## 2016-05-27 ENCOUNTER — Other Ambulatory Visit (HOSPITAL_COMMUNITY): Payer: Self-pay | Admitting: Interventional Radiology

## 2016-06-09 DIAGNOSIS — R972 Elevated prostate specific antigen [PSA]: Secondary | ICD-10-CM | POA: Diagnosis not present

## 2016-06-16 DIAGNOSIS — R350 Frequency of micturition: Secondary | ICD-10-CM | POA: Diagnosis not present

## 2016-06-16 DIAGNOSIS — N401 Enlarged prostate with lower urinary tract symptoms: Secondary | ICD-10-CM | POA: Diagnosis not present

## 2016-06-16 DIAGNOSIS — R972 Elevated prostate specific antigen [PSA]: Secondary | ICD-10-CM | POA: Diagnosis not present

## 2016-06-16 DIAGNOSIS — N5201 Erectile dysfunction due to arterial insufficiency: Secondary | ICD-10-CM | POA: Diagnosis not present

## 2016-06-30 ENCOUNTER — Telehealth: Payer: Self-pay | Admitting: Family Medicine

## 2016-06-30 MED ORDER — METHYLPHENIDATE HCL ER 20 MG PO TBCR: 20.0000 mg | EXTENDED_RELEASE_TABLET | ORAL | 0 refills | Status: DC

## 2016-06-30 NOTE — Telephone Encounter (Signed)
done

## 2016-06-30 NOTE — Telephone Encounter (Signed)
° ° ° °  Pt request refill of the following: ° ° °methylphenidate (METADATE ER) 20 MG ER tablet ° °Phamacy: °

## 2016-07-01 NOTE — Telephone Encounter (Signed)
Script is ready for pick up here at front office and I left a voice message for pt.  

## 2016-09-01 ENCOUNTER — Ambulatory Visit (INDEPENDENT_AMBULATORY_CARE_PROVIDER_SITE_OTHER): Payer: BLUE CROSS/BLUE SHIELD | Admitting: Family Medicine

## 2016-09-01 ENCOUNTER — Encounter: Payer: Self-pay | Admitting: Family Medicine

## 2016-09-01 VITALS — BP 140/82 | Temp 97.7°F | Ht 72.0 in | Wt 203.3 lb

## 2016-09-01 DIAGNOSIS — M67431 Ganglion, right wrist: Secondary | ICD-10-CM | POA: Diagnosis not present

## 2016-09-01 DIAGNOSIS — R531 Weakness: Secondary | ICD-10-CM

## 2016-09-01 LAB — BASIC METABOLIC PANEL
BUN: 26 mg/dL — ABNORMAL HIGH (ref 6–23)
CO2: 28 mEq/L (ref 19–32)
Calcium: 9.6 mg/dL (ref 8.4–10.5)
Chloride: 107 mEq/L (ref 96–112)
Creatinine, Ser: 0.76 mg/dL (ref 0.40–1.50)
GFR: 112 mL/min (ref >60.00)
Glucose, Bld: 102 mg/dL — ABNORMAL HIGH (ref 70–99)
Potassium: 4.6 mEq/L (ref 3.5–5.1)
Sodium: 139 mEq/L (ref 135–145)

## 2016-09-01 LAB — CBC WITH DIFFERENTIAL/PLATELET
Basophils Absolute: 0.1 10*3/uL (ref 0.0–0.1)
Basophils Relative: 0.7 % (ref 0.0–3.0)
Eosinophils Absolute: 0.2 10*3/uL (ref 0.0–0.7)
Eosinophils Relative: 2.2 % (ref 0.0–5.0)
HCT: 44.2 % (ref 39.0–52.0)
Hemoglobin: 14.7 g/dL (ref 13.0–17.0)
Lymphocytes Relative: 33.4 % (ref 12.0–46.0)
Lymphs Abs: 3 10*3/uL (ref 0.7–4.0)
MCHC: 33.2 g/dL (ref 30.0–36.0)
MCV: 89.5 fl (ref 78.0–100.0)
Monocytes Absolute: 0.5 10*3/uL (ref 0.1–1.0)
Monocytes Relative: 5.3 % (ref 3.0–12.0)
Neutro Abs: 5.2 10*3/uL (ref 1.4–7.7)
Neutrophils Relative %: 58.4 % (ref 43.0–77.0)
Platelets: 171 10*3/uL (ref 150.0–400.0)
RBC: 4.94 Mil/uL (ref 4.22–5.81)
RDW: 13.7 % (ref 11.5–15.5)
WBC: 9 10*3/uL (ref 4.0–10.5)

## 2016-09-01 LAB — HEPATIC FUNCTION PANEL
ALT: 16 U/L (ref 0–53)
AST: 14 U/L (ref 0–37)
Albumin: 4.7 g/dL (ref 3.5–5.2)
Alkaline Phosphatase: 51 U/L (ref 39–117)
Bilirubin, Direct: 0.1 mg/dL (ref 0.0–0.3)
Total Bilirubin: 0.3 mg/dL (ref 0.2–1.2)
Total Protein: 7.2 g/dL (ref 6.0–8.3)

## 2016-09-01 LAB — TSH: TSH: 1.82 u[IU]/mL (ref 0.35–4.50)

## 2016-09-01 LAB — VITAMIN B12: Vitamin B-12: 426 pg/mL (ref 211–911)
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
## 2016-09-09 ENCOUNTER — Telehealth: Payer: Self-pay | Admitting: Family Medicine

## 2016-09-09 NOTE — Telephone Encounter (Signed)
Pt would like to have his referral to [facility] changed to [employer] [facility]

## 2016-09-09 NOTE — Telephone Encounter (Signed)
Patient requests this be changed to Riley Hartman , please make the change

## 2016-09-22 DIAGNOSIS — R2231 Localized swelling, mass and lump, right upper limb: Secondary | ICD-10-CM | POA: Diagnosis not present

## 2016-09-23 ENCOUNTER — Other Ambulatory Visit: Payer: Self-pay | Admitting: Family Medicine

## 2016-09-29 DIAGNOSIS — M25531 Pain in right wrist: Secondary | ICD-10-CM | POA: Diagnosis not present

## 2016-10-13 ENCOUNTER — Ambulatory Visit (INDEPENDENT_AMBULATORY_CARE_PROVIDER_SITE_OTHER): Payer: BLUE CROSS/BLUE SHIELD | Admitting: Diagnostic Neuroimaging

## 2016-10-13 ENCOUNTER — Encounter: Payer: Self-pay | Admitting: Diagnostic Neuroimaging

## 2016-10-13 VITALS — BP 112/72 | HR 84 | Ht 72.0 in | Wt 205.8 lb

## 2016-10-13 DIAGNOSIS — R531 Weakness: Secondary | ICD-10-CM

## 2016-10-13 DIAGNOSIS — R202 Paresthesia of skin: Secondary | ICD-10-CM

## 2016-10-13 DIAGNOSIS — R200 Anesthesia of skin: Secondary | ICD-10-CM | POA: Diagnosis not present

## 2016-10-13 MED ORDER — ASPIRIN 81 MG PO TABS: 81.0000 mg | ORAL_TABLET | Freq: Every day | ORAL | Status: DC

## 2016-10-13 NOTE — Patient Instructions (Signed)
Thank you for coming to see [place] at [facility]. I hope we have been able to provide you high quality care today.  You may receive a patient satisfaction survey over the next few weeks. We would appreciate your feedback and comments so that we may continue to improve ourselves and the health of our patients.  - check MRI brain  - ultrasound of heart and carotid arteries  - start aspirin 81m daily   ~~~~~~~~~~~~~~~~~~~~~~~~~~~~~~~~~~~~~~~~~~~~~~~~~~~~~~~~~~~~~~~~~  DR. PENUMALLI'S GUIDE TO HAPPY AND HEALTHY LIVING These are some of my general health and wellness recommendations. Some of them may apply to you better than others. Please use common sense as you try these suggestions and feel free to ask me any questions.   ACTIVITY/FITNESS Mental, social, emotional and physical stimulation are very important for brain and body health. Try learning a new activity (arts, music, language, sports, games).  Keep moving your body to the best of your abilities. You can do this at home, inside or outside, the park, community center, gym or anywhere you like. Consider a physical therapist or personal trainer to get started. Consider the app Sworkit. Fitness trackers such as smart-watches, smart-phones or Fitbits can help as well.   NUTRITION Eat more plants: colorful vegetables, nuts, seeds and berries.  Eat less sugar, salt, preservatives and processed foods.  Avoid toxins such as cigarettes and alcohol.  Drink water when you are thirsty. Warm water with a slice of lemon is an excellent morning drink to start the day.  Consider these websites for more information The Nutrition Source (hhttps://www.henry-hernandez.biz/ Precision Nutrition (wWindowBlog.ch   RELAXATION Consider practicing mindfulness meditation or other relaxation techniques such as deep breathing, prayer, yoga, tai chi, massage. See website mindful.org or the apps  Headspace or Calm to help get started.   SLEEP Try to get at least 7-8+ hours sleep per day. Regular exercise and reduced caffeine will help you sleep better. Practice good sleep hygeine techniques. See website sleep.org for more information.   PLANNING Prepare estate planning, living will, healthcare POA documents. Sometimes this is best planned with the help of an attorney. Theconversationproject.org and agingwithdignity.org are excellent resources.
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
## 2016-10-17 DIAGNOSIS — M67431 Ganglion, right wrist: Secondary | ICD-10-CM | POA: Diagnosis not present

## 2016-10-22 ENCOUNTER — Ambulatory Visit (INDEPENDENT_AMBULATORY_CARE_PROVIDER_SITE_OTHER): Payer: BLUE CROSS/BLUE SHIELD

## 2016-10-22 ENCOUNTER — Ambulatory Visit: Payer: BLUE CROSS/BLUE SHIELD

## 2016-10-22 ENCOUNTER — Other Ambulatory Visit: Payer: BLUE CROSS/BLUE SHIELD

## 2016-10-22 DIAGNOSIS — R200 Anesthesia of skin: Secondary | ICD-10-CM | POA: Diagnosis not present

## 2016-10-23 ENCOUNTER — Other Ambulatory Visit: Payer: Self-pay | Admitting: Orthopedic Surgery

## 2016-10-23 DIAGNOSIS — M7989 Other specified soft tissue disorders: Secondary | ICD-10-CM | POA: Diagnosis not present

## 2016-10-23 DIAGNOSIS — M67431 Ganglion, right wrist: Secondary | ICD-10-CM | POA: Diagnosis not present

## 2016-10-28 ENCOUNTER — Other Ambulatory Visit (HOSPITAL_COMMUNITY): Payer: BLUE CROSS/BLUE SHIELD

## 2016-11-03 DIAGNOSIS — M67431 Ganglion, right wrist: Secondary | ICD-10-CM | POA: Diagnosis not present

## 2016-11-05 ENCOUNTER — Ambulatory Visit (INDEPENDENT_AMBULATORY_CARE_PROVIDER_SITE_OTHER): Payer: BLUE CROSS/BLUE SHIELD | Admitting: Family Medicine

## 2016-11-05 ENCOUNTER — Encounter: Payer: Self-pay | Admitting: Family Medicine

## 2016-11-05 VITALS — BP 150/100 | HR 83 | Temp 98.0°F | Ht 72.0 in | Wt 203.0 lb

## 2016-11-05 DIAGNOSIS — M542 Cervicalgia: Secondary | ICD-10-CM | POA: Diagnosis not present

## 2016-11-05 MED ORDER — METHYLPREDNISOLONE 4 MG PO TBPK: ORAL_TABLET | ORAL | 0 refills | Status: DC

## 2016-11-05 MED ORDER — METHYLPHENIDATE HCL ER 20 MG PO TBCR: 20.0000 mg | EXTENDED_RELEASE_TABLET | ORAL | 0 refills | Status: DC

## 2016-11-05 MED ORDER — KETOROLAC TROMETHAMINE 60 MG/2ML IM SOLN
60.0000 mg | Freq: Once | INTRAMUSCULAR | Status: AC
  Administered 2016-11-05: 60 mg via INTRAMUSCULAR

## 2016-11-05 MED ORDER — CYCLOBENZAPRINE HCL 10 MG PO TABS: 10.0000 mg | ORAL_TABLET | Freq: Three times a day (TID) | ORAL | 2 refills | Status: DC | PRN

## 2016-11-05 NOTE — Addendum Note (Signed)
Addended by: Aggie Hacker A on: 11/05/2016 09:21 AM   Modules accepted: Orders

## 2016-11-05 NOTE — Progress Notes (Signed)
   Subjective:    Patient ID: Riley Hartman, male    DOB: [birth year], 58 y.o.   MRN: [id number]  HPI Here for 2 days of stiffness and pain in the neck. No recent trauma. He had an MRI of the cervical spine in 2010 which showed several degenerated discs. He woke up yesterday morning with a mild sharp pain in the neck that stayed all day. He slept in his recliner last night (which he often does) and woke up this morning with a much more severe pain in the neck. He has a lot of spasm and he can barely move his head. No symptoms down the arms.    Review of Systems  Constitutional: Negative.   Respiratory: Negative.   Cardiovascular: Negative.   Musculoskeletal: Positive for neck pain and neck stiffness.  Neurological: Negative.        Objective:   Physical Exam  Constitutional: He is oriented to person, place, and time.  In obvious pain   Cardiovascular: Normal rate, regular rhythm, normal heart sounds and intact distal pulses.   Pulmonary/Chest: Effort normal and breath sounds normal. No respiratory distress. He has no wheezes. He has no rales.  Musculoskeletal:  He has a lot of spasm in the neck and the posterior neck is quite tender, especially at the base of the skull. ROM is extremely limited   Neurological: He is alert and oriented to person, place, and time.          Assessment & Plan:  Neck pain. Given a shot of Toradol. Treat with heat, Flexeril, and a Medrol dose pack. Recheck prn.  Alysia Penna, MD

## 2016-11-05 NOTE — Patient Instructions (Signed)
WE NOW OFFER   [facility] FAST TRACK!!!  SAME DAY Appointments for ACUTE CARE  Such as: Sprains, Injuries, cuts, abrasions, rashes, muscle pain, joint pain, back pain Colds, flu, sore throats, headache, allergies, cough, fever  Ear pain, sinus and eye infections Abdominal pain, nausea, vomiting, diarrhea, upset stomach Animal/insect bites  3 Easy Ways to Schedule: Walk-In Scheduling Call in scheduling Mychart Sign-up: [email or web address]         

## 2016-11-30 ENCOUNTER — Other Ambulatory Visit: Payer: Self-pay | Admitting: Family Medicine

## 2016-12-01 ENCOUNTER — Ambulatory Visit (INDEPENDENT_AMBULATORY_CARE_PROVIDER_SITE_OTHER): Payer: BLUE CROSS/BLUE SHIELD | Admitting: Family Medicine

## 2016-12-01 ENCOUNTER — Encounter: Payer: Self-pay | Admitting: Family Medicine

## 2016-12-01 VITALS — BP 162/100 | Temp 97.7°F | Ht 72.0 in | Wt 204.8 lb

## 2016-12-01 DIAGNOSIS — S4362XA Sprain of left sternoclavicular joint, initial encounter: Secondary | ICD-10-CM

## 2016-12-01 MED ORDER — LISINOPRIL 20 MG PO TABS: 20.0000 mg | ORAL_TABLET | Freq: Every day | ORAL | 3 refills | Status: DC

## 2016-12-01 MED ORDER — METHYLPREDNISOLONE 4 MG PO TBPK: ORAL_TABLET | ORAL | 0 refills | Status: DC

## 2016-12-01 NOTE — Progress Notes (Signed)
   Subjective:    Patient ID: Riley Hartman, male    DOB: January 16, 1961, 56 y.o.   MRN: [id number]  HPI Here for one week of swelling and pain around the left clavicle. No recent trauma or falls, but he had been moving a heavy bed frame at home when this started. Using heat.    Review of Systems  Constitutional: Negative.   Respiratory: Negative.   Cardiovascular: Negative.   Musculoskeletal: Positive for arthralgias.       Objective:   Physical Exam  Constitutional: He appears well-developed and well-nourished.  Cardiovascular: Normal rate, regular rhythm, normal heart sounds and intact distal pulses.   Pulmonary/Chest: Effort normal and breath sounds normal. No respiratory distress. He has no wheezes.  Musculoskeletal:  The left sternoclavicular joint is swollen and tender, not red or warm. ROM is full          Assessment & Plan:  Inflammation of the sternoclavicular joint. Use ice packs tid, rest, and given a Medrol dose pack. Written out of work today and tomorrow.  Alysia Penna, MD

## 2016-12-03 DIAGNOSIS — M67431 Ganglion, right wrist: Secondary | ICD-10-CM | POA: Diagnosis not present

## 2016-12-08 ENCOUNTER — Ambulatory Visit (HOSPITAL_BASED_OUTPATIENT_CLINIC_OR_DEPARTMENT_OTHER): Payer: BLUE CROSS/BLUE SHIELD

## 2016-12-08 ENCOUNTER — Other Ambulatory Visit: Payer: Self-pay

## 2016-12-08 ENCOUNTER — Other Ambulatory Visit (HOSPITAL_COMMUNITY): Payer: BLUE CROSS/BLUE SHIELD

## 2016-12-08 ENCOUNTER — Encounter (INDEPENDENT_AMBULATORY_CARE_PROVIDER_SITE_OTHER): Payer: Self-pay

## 2016-12-08 ENCOUNTER — Ambulatory Visit (HOSPITAL_COMMUNITY)
Admission: RE | Admit: 2016-12-08 | Discharge: 2016-12-08 | Disposition: A | Discharge status: Home / Self Care | Payer: BLUE CROSS/BLUE SHIELD | Source: Ambulatory Visit | Attending: Internal Medicine | Admitting: Internal Medicine

## 2016-12-08 DIAGNOSIS — I080 Rheumatic disorders of both mitral and aortic valves: Secondary | ICD-10-CM | POA: Insufficient documentation

## 2016-12-08 DIAGNOSIS — I4510 Unspecified right bundle-branch block: Secondary | ICD-10-CM | POA: Diagnosis not present

## 2016-12-08 DIAGNOSIS — R200 Anesthesia of skin: Secondary | ICD-10-CM | POA: Diagnosis not present

## 2016-12-08 DIAGNOSIS — E785 Hyperlipidemia, unspecified: Secondary | ICD-10-CM | POA: Insufficient documentation

## 2016-12-08 DIAGNOSIS — R531 Weakness: Secondary | ICD-10-CM

## 2016-12-08 DIAGNOSIS — R202 Paresthesia of skin: Secondary | ICD-10-CM

## 2016-12-08 DIAGNOSIS — Z720 Tobacco use: Secondary | ICD-10-CM | POA: Insufficient documentation

## 2016-12-09 ENCOUNTER — Telehealth: Payer: Self-pay | Admitting: *Deleted

## 2016-12-09 NOTE — Telephone Encounter (Addendum)
Spoke with patient and informed him that his Carotid US showed some plaque, bilaterally; but no major stenosis that needs surgery. Advised her Dr Leta Baptist recommends medical management of controlling BP, fats/lipids.  Then informed him that his echocardiogram results were unremarkable, no major findings. Reminded him of follow up in Oct and advised Dr Leta Baptist may consider further testing at that time. Advised he call prior to FU if needed. He verbalized understanding, appreciation, had no questions.

## 2016-12-12 DIAGNOSIS — R972 Elevated prostate specific antigen [PSA]: Secondary | ICD-10-CM | POA: Diagnosis not present

## 2016-12-18 DIAGNOSIS — N5201 Erectile dysfunction due to arterial insufficiency: Secondary | ICD-10-CM | POA: Diagnosis not present

## 2016-12-18 DIAGNOSIS — N401 Enlarged prostate with lower urinary tract symptoms: Secondary | ICD-10-CM | POA: Diagnosis not present

## 2016-12-18 DIAGNOSIS — R350 Frequency of micturition: Secondary | ICD-10-CM | POA: Diagnosis not present

## 2016-12-18 DIAGNOSIS — R972 Elevated prostate specific antigen [PSA]: Secondary | ICD-10-CM | POA: Diagnosis not present

## 2017-01-26 ENCOUNTER — Ambulatory Visit (INDEPENDENT_AMBULATORY_CARE_PROVIDER_SITE_OTHER): Payer: BLUE CROSS/BLUE SHIELD | Admitting: Diagnostic Neuroimaging

## 2017-01-26 ENCOUNTER — Encounter: Payer: Self-pay | Admitting: Diagnostic Neuroimaging

## 2017-01-26 VITALS — BP 137/77 | HR 81 | Wt 207.8 lb

## 2017-01-26 DIAGNOSIS — R202 Paresthesia of skin: Secondary | ICD-10-CM | POA: Diagnosis not present

## 2017-01-26 DIAGNOSIS — R200 Anesthesia of skin: Secondary | ICD-10-CM | POA: Diagnosis not present

## 2017-01-26 DIAGNOSIS — R531 Weakness: Secondary | ICD-10-CM | POA: Diagnosis not present
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
## 2017-02-17 ENCOUNTER — Telehealth: Payer: Self-pay | Admitting: Family Medicine

## 2017-02-17 NOTE — Telephone Encounter (Signed)
° ° ° ° ° °  Pt request refill of the following:   methylphenidate (METADATE ER) 20 MG ER tablet  Phamacy:

## 2017-02-18 MED ORDER — METHYLPHENIDATE HCL ER 20 MG PO TBCR: 20.0000 mg | EXTENDED_RELEASE_TABLET | ORAL | 0 refills | Status: DC

## 2017-02-18 NOTE — Telephone Encounter (Signed)
Done

## 2017-02-19 NOTE — Telephone Encounter (Signed)
Script is ready for pick up here at front office and I left a voice message for pt.

## 2017-02-24 ENCOUNTER — Telehealth: Payer: Self-pay | Admitting: Family Medicine

## 2017-02-24 NOTE — Telephone Encounter (Signed)
Copied from [place]. Topic: Quick Communication - See Telephone Encounter >> Feb 24, 2017 12:19 PM Carolyn Stare wrote: P t following up on request for refill on METHYLPHENIDATE  CRM for notification. See Telephone encounter for: 02/24/17. / Left voicemail for patient that prescription is ready for pick up at the doctor's office    11:11 AM  Note    Script is ready for pick up here at front office and I left a voice message for pt.

## 2017-02-24 NOTE — Telephone Encounter (Signed)
Left message that Rx requested is ready at office.

## 2017-02-24 NOTE — Telephone Encounter (Signed)
Copied from [place]. Topic: Quick Communication - See Telephone Encounter >> Feb 24, 2017 12:19 PM Carolyn Stare wrote: P t following up on request for refill on METHYLPHENIDATE  CRM for notification. See Telephone encounter for: 02/24/17.

## 2017-05-24 ENCOUNTER — Emergency Department (HOSPITAL_COMMUNITY)
Admission: EM | Admit: 2017-05-24 | Discharge: 2017-05-25 | Disposition: A | Discharge status: Home / Self Care | Payer: BLUE CROSS/BLUE SHIELD | Attending: Emergency Medicine | Admitting: Emergency Medicine

## 2017-05-24 ENCOUNTER — Other Ambulatory Visit: Payer: Self-pay

## 2017-05-24 ENCOUNTER — Emergency Department (HOSPITAL_COMMUNITY): Payer: BLUE CROSS/BLUE SHIELD

## 2017-05-24 ENCOUNTER — Encounter (HOSPITAL_COMMUNITY): Payer: Self-pay | Admitting: Emergency Medicine

## 2017-05-24 DIAGNOSIS — R079 Chest pain, unspecified: Secondary | ICD-10-CM | POA: Diagnosis not present

## 2017-05-24 DIAGNOSIS — F17210 Nicotine dependence, cigarettes, uncomplicated: Secondary | ICD-10-CM | POA: Diagnosis not present

## 2017-05-24 DIAGNOSIS — I2510 Atherosclerotic heart disease of native coronary artery without angina pectoris: Secondary | ICD-10-CM | POA: Diagnosis not present

## 2017-05-24 DIAGNOSIS — Z7982 Long term (current) use of aspirin: Secondary | ICD-10-CM | POA: Diagnosis not present

## 2017-05-24 DIAGNOSIS — I10 Essential (primary) hypertension: Secondary | ICD-10-CM | POA: Insufficient documentation

## 2017-05-24 DIAGNOSIS — Z79899 Other long term (current) drug therapy: Secondary | ICD-10-CM | POA: Diagnosis not present

## 2017-05-24 DIAGNOSIS — F419 Anxiety disorder, unspecified: Secondary | ICD-10-CM | POA: Insufficient documentation

## 2017-05-24 DIAGNOSIS — F909 Attention-deficit hyperactivity disorder, unspecified type: Secondary | ICD-10-CM | POA: Diagnosis not present

## 2017-05-24 DIAGNOSIS — R0602 Shortness of breath: Secondary | ICD-10-CM | POA: Diagnosis not present

## 2017-05-24 DIAGNOSIS — M549 Dorsalgia, unspecified: Secondary | ICD-10-CM | POA: Diagnosis not present

## 2017-05-24 LAB — HEPATIC FUNCTION PANEL
ALT: 17 U/L (ref 17–63)
AST: 19 U/L (ref 15–41)
Albumin: 4.3 g/dL (ref 3.5–5.0)
Alkaline Phosphatase: 51 U/L (ref 38–126)
Bilirubin, Direct: <0.1 mg/dL — ABNORMAL LOW (ref 0.1–0.5)
Total Bilirubin: 0.8 mg/dL (ref 0.3–1.2)
Total Protein: 7.2 g/dL (ref 6.5–8.1)

## 2017-05-24 LAB — CBC
HCT: 44.4 % (ref 39.0–52.0)
Hemoglobin: 14.9 g/dL (ref 13.0–17.0)
MCH: 29.9 pg (ref 26.0–34.0)
MCHC: 33.6 g/dL (ref 30.0–36.0)
MCV: 89 fL (ref 78.0–100.0)
Platelets: 210 10*3/uL (ref 150–400)
RBC: 4.99 MIL/uL (ref 4.22–5.81)
RDW: 13.1 % (ref 11.5–15.5)
WBC: 10.3 10*3/uL (ref 4.0–10.5)

## 2017-05-24 LAB — BASIC METABOLIC PANEL
Anion gap: 12 (ref 5–15)
BUN: 16 mg/dL (ref 6–20)
CO2: 21 mmol/L — ABNORMAL LOW (ref 22–32)
Calcium: 9.1 mg/dL (ref 8.9–10.3)
Chloride: 105 mmol/L (ref 101–111)
Creatinine, Ser: 0.75 mg/dL (ref 0.61–1.24)
GFR calc Af Amer: >60 mL/min (ref >60)
GFR calc non Af Amer: >60 mL/min (ref >60)
Glucose, Bld: 82 mg/dL (ref 65–99)
Potassium: 3.8 mmol/L (ref 3.5–5.1)
Sodium: 138 mmol/L (ref 135–145)

## 2017-05-24 LAB — I-STAT TROPONIN, ED: Troponin i, poc: 0 ng/mL (ref 0.00–0.08)

## 2017-05-24 LAB — LIPASE, BLOOD: Lipase: 32 U/L (ref 11–51)

## 2017-05-24 MED ORDER — OMEPRAZOLE 20 MG PO CPDR: 20.0000 mg | DELAYED_RELEASE_CAPSULE | Freq: Every day | ORAL | 0 refills | Status: DC

## 2017-05-24 NOTE — ED Triage Notes (Signed)
Pt reports left sided chest pain that moves b/t his chest and his back since Friday.  Pt denies n/v/d/LOC/dizziness.  Pt reports a few episodes of "acid refulx".

## 2017-05-24 NOTE — Discharge Instructions (Signed)
Please read and follow all provided instructions.  Your diagnoses today include:  1. Chest pain in adult     Tests performed today include:  An EKG of your heart  A chest x-ray - no infections or problems  Cardiac enzymes - a blood test for heart muscle damage, no sign of heart attack  Blood counts and electrolytes  Vital signs. See below for your results today.   Medications prescribed:   Omeprazole (Prilosec) - stomach acid reducer  This medication can be found over-the-counter  Take any prescribed medications only as directed.  Follow-up instructions: Please follow-up with your primary care provider as soon as you can for further evaluation of your symptoms.   Return instructions:  SEEK IMMEDIATE MEDICAL ATTENTION IF:  You have severe chest pain, especially if the pain is crushing or pressure-like and spreads to the arms, back, neck, or jaw, or if you have sweating, nausea (feeling sick to your stomach), or shortness of breath. THIS IS AN EMERGENCY. Don't wait to see if the pain will go away. Get medical help at once. Call 911 or 0 (operator). DO NOT drive yourself to the hospital.   Your chest pain gets worse and does not go away with rest.   You have an attack of chest pain lasting longer than usual, despite rest and treatment with the medications your caregiver has prescribed.   You wake from sleep with chest pain or shortness of breath.  You feel dizzy or faint.  You have chest pain not typical of your usual pain for which you originally saw your caregiver.   You have any other emergent concerns regarding your health.  Additional Information: Chest pain comes from many different causes. Your caregiver has diagnosed you as having chest pain that is not specific for one problem, but does not require admission.  You are at low risk for an acute heart condition or other serious illness.   Your vital signs today were: BP (!) 135/94 (BP Location: Right Arm)    Pulse  84    Temp 97.6 F (36.4 C) (Oral)    Resp 16    Ht 6\' 1"  (1.854 m)    Wt 93 kg (205 lb)    SpO2 98%    BMI 27.05 kg/m  If your blood pressure (BP) was elevated above 135/85 this visit, please have this repeated by your doctor within one month. --------------

## 2017-05-24 NOTE — ED Provider Notes (Signed)
Medical screening examination/treatment/procedure(s) were conducted as a shared visit with non-physician practitioner(s) and myself.  I personally evaluated the patient during the encounter.   EKG Interpretation  Date/Time:  Sunday May 24 2017 19:39:21 EST Ventricular Rate:  83 PR Interval:  126 QRS Duration: 142 QT Interval:  400 QTC Calculation: 470 R Axis:   160 Text Interpretation:  RBBB, unchanged from previous Confirmed by Charlesetta Shanks [phone]) on 05/24/2017 11:24:21 PM     Patient reports he has had left sided chest pain that radiates to his shoulder blade.  It started 2 days ago.  The course of the week patient had other vague symptom general fatigue continuing to do all regular activities.  Patient became more concerned today (due to his history of coronary artery disease) when he also developed severe pain after swallowing sausage earlier today.   Patient is alert and nontoxic clinically well in appearance.  Normal heart and lung exam.  No peripheral edema.  Initial diagnostic workup is negative for MI. Patient does not wish to await cardiology consultation or consider observation rule out.  He advises he knows he has coronary artery disease and he wanted to make sure that he had not had a heart attack in association with his symptoms.  He prefers to pursue close follow-up with cardiology as an outpatient.  I do think this is unreasonable although advised that best management would at least be cardiology consult in ED, patient is counseled on signs and symptoms for which to return immediately.   Charlesetta Shanks, MD 05/28/17 [phone]

## 2017-05-24 NOTE — ED Notes (Signed)
Patient transported to X-ray 
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
## 2017-05-25 LAB — I-STAT TROPONIN, ED: Troponin i, poc: 0 ng/mL (ref 0.00–0.08)

## 2017-05-26 ENCOUNTER — Encounter: Payer: Self-pay | Admitting: Family Medicine

## 2017-05-26 ENCOUNTER — Ambulatory Visit: Payer: BLUE CROSS/BLUE SHIELD | Admitting: Family Medicine

## 2017-05-26 VITALS — BP 138/84 | HR 70 | Temp 97.7°F | Wt 215.8 lb

## 2017-05-26 DIAGNOSIS — R0789 Other chest pain: Secondary | ICD-10-CM

## 2017-05-26 MED ORDER — METHYLPREDNISOLONE 4 MG PO TBPK: ORAL_TABLET | ORAL | 0 refills | Status: DC

## 2017-05-26 NOTE — Progress Notes (Signed)
   Subjective:    Patient ID: Riley Hartman, male    DOB: Jun 06, 1960, 57 y.o.   MRN: [id number]  HPI Here to follow up an ER visit on 05-24-17 for left sided chest pain and left upper back pain. This has been going on about a week. No SOB or sweats. This is not associated with exertion. Taking a deep breath makes the pain worse. At the ER he had a normal exam, normal EKG, normal cardiac enzymes, and a normal CXR. Once it was determined the pain was not of cardiac origin he was sent home. Today he feels fine except he feels a slight sharp pain in the left chest under the nipple and in the left upper back under the shoulder blade when he moves certain ways or takes a deep breath.    Review of Systems  Constitutional: Negative.   Respiratory: Negative.   Cardiovascular: Positive for chest pain. Negative for palpitations and leg swelling.  Gastrointestinal: Negative.   Neurological: Negative.        Objective:   Physical Exam  Constitutional: He is oriented to person, place, and time. He appears well-developed and well-nourished.  Cardiovascular: Normal rate, regular rhythm, normal heart sounds and intact distal pulses.  Pulmonary/Chest: Effort normal and breath sounds normal. No respiratory distress. He has no wheezes. He has no rales. He exhibits no tenderness.  Musculoskeletal:  He is mildly tender in the left upper back between the scapula and the spine   Neurological: He is alert and oriented to person, place, and time.          Assessment & Plan:  He seems to be having a chest wall pain that likely begins with a pinched nerve in the upper back. We will treat with a Medrol dose pack. Recheck prn.  Alysia Penna, MD

## 2017-06-04 ENCOUNTER — Telehealth: Payer: Self-pay | Admitting: Family Medicine

## 2017-06-04 NOTE — Telephone Encounter (Signed)
Sent to PCP to advise 

## 2017-06-04 NOTE — Telephone Encounter (Signed)
Copied from [place]. Topic: Inquiry >> Jun 04, 2017  9:36 AM Margot Ables wrote: Reason for CRM: pt states the pain in shoulder has returned starting yesterday. Pt completed the prednisone pack he was given with he came in and saw Dr. [name] 05/26/17. Pt asking what he should do? Possibly another prednisone pack. Please advise.

## 2017-06-05 MED ORDER — METHYLPREDNISOLONE 4 MG PO TBPK: ORAL_TABLET | ORAL | 0 refills | Status: DC

## 2017-06-05 NOTE — Telephone Encounter (Signed)
Rx sent. Called pt and left a VM that Rx has been sent and if pain is still there we will set up a referral to se an ortho.

## 2017-06-05 NOTE — Telephone Encounter (Signed)
Call in another Medrol dose pack. It the pain is still there after that, I will refer him to Orthopedics

## 2017-06-09 ENCOUNTER — Encounter: Payer: BLUE CROSS/BLUE SHIELD | Admitting: Family Medicine

## 2017-06-30 ENCOUNTER — Ambulatory Visit (INDEPENDENT_AMBULATORY_CARE_PROVIDER_SITE_OTHER): Payer: BLUE CROSS/BLUE SHIELD | Admitting: Family Medicine

## 2017-06-30 ENCOUNTER — Encounter: Payer: Self-pay | Admitting: Family Medicine

## 2017-06-30 VITALS — BP 118/76 | HR 66 | Temp 97.6°F | Ht 72.0 in | Wt 211.0 lb

## 2017-06-30 DIAGNOSIS — Z125 Encounter for screening for malignant neoplasm of prostate: Secondary | ICD-10-CM

## 2017-06-30 DIAGNOSIS — Z0000 Encounter for general adult medical examination without abnormal findings: Secondary | ICD-10-CM | POA: Diagnosis not present

## 2017-06-30 LAB — CBC WITH DIFFERENTIAL/PLATELET
Basophils Absolute: 0.1 10*3/uL (ref 0.0–0.1)
Basophils Relative: 0.7 % (ref 0.0–3.0)
Eosinophils Absolute: 0.3 10*3/uL (ref 0.0–0.7)
Eosinophils Relative: 3.8 % (ref 0.0–5.0)
HCT: 45.2 % (ref 39.0–52.0)
Hemoglobin: 15.2 g/dL (ref 13.0–17.0)
Lymphocytes Relative: 32.1 % (ref 12.0–46.0)
Lymphs Abs: 2.9 10*3/uL (ref 0.7–4.0)
MCHC: 33.7 g/dL (ref 30.0–36.0)
MCV: 88.9 fl (ref 78.0–100.0)
Monocytes Absolute: 0.6 10*3/uL (ref 0.1–1.0)
Monocytes Relative: 6.6 % (ref 3.0–12.0)
Neutro Abs: 5.1 10*3/uL (ref 1.4–7.7)
Neutrophils Relative %: 56.8 % (ref 43.0–77.0)
Platelets: 184 10*3/uL (ref 150.0–400.0)
RBC: 5.09 Mil/uL (ref 4.22–5.81)
RDW: 13.8 % (ref 11.5–15.5)
WBC: 9 10*3/uL (ref 4.0–10.5)

## 2017-06-30 LAB — LIPID PANEL
Cholesterol: 218 mg/dL — ABNORMAL HIGH (ref 0–200)
HDL: 40.5 mg/dL (ref >39.00)
LDL Cholesterol: 149 mg/dL — ABNORMAL HIGH (ref 0–99)
NonHDL: 177.74
Total CHOL/HDL Ratio: 5
Triglycerides: 143 mg/dL (ref 0.0–149.0)
VLDL: 28.6 mg/dL (ref 0.0–40.0)

## 2017-06-30 LAB — BASIC METABOLIC PANEL
BUN: 20 mg/dL (ref 6–23)
CO2: 31 mEq/L (ref 19–32)
Calcium: 9.9 mg/dL (ref 8.4–10.5)
Chloride: 105 mEq/L (ref 96–112)
Creatinine, Ser: 0.85 mg/dL (ref 0.40–1.50)
GFR: 98.15 mL/min (ref >60.00)
Glucose, Bld: 105 mg/dL — ABNORMAL HIGH (ref 70–99)
Potassium: 5.1 mEq/L (ref 3.5–5.1)
Sodium: 141 mEq/L (ref 135–145)

## 2017-06-30 LAB — HEPATIC FUNCTION PANEL
ALT: 16 U/L (ref 0–53)
AST: 13 U/L (ref 0–37)
Albumin: 4.6 g/dL (ref 3.5–5.2)
Alkaline Phosphatase: 52 U/L (ref 39–117)
Bilirubin, Direct: 0 mg/dL (ref 0.0–0.3)
Total Bilirubin: 0.5 mg/dL (ref 0.2–1.2)
Total Protein: 7 g/dL (ref 6.0–8.3)

## 2017-06-30 LAB — TSH: TSH: 2.09 u[IU]/mL (ref 0.35–4.50)

## 2017-06-30 LAB — POC URINALSYSI DIPSTICK (AUTOMATED)
Bilirubin, UA: NEGATIVE
Blood, UA: NEGATIVE
Glucose, UA: NEGATIVE
Ketones, UA: NEGATIVE
Leukocytes, UA: NEGATIVE
Nitrite, UA: NEGATIVE
Protein, UA: NEGATIVE
Spec Grav, UA: >=1.03 — AB (ref 1.010–1.025)
Urobilinogen, UA: 0.2 E.U./dL
pH, UA: 6 (ref 5.0–8.0)

## 2017-06-30 LAB — PSA: PSA: 14.09 ng/mL — ABNORMAL HIGH (ref 0.10–4.00)

## 2017-06-30 MED ORDER — METHYLPHENIDATE HCL ER 20 MG PO TBCR: 20.0000 mg | EXTENDED_RELEASE_TABLET | ORAL | 0 refills | Status: DC

## 2017-06-30 NOTE — Progress Notes (Signed)
   Subjective:    Patient ID: Riley Hartman, male    DOB: 07/14/1960, 57 y.o.   MRN: [id number]  HPI Here for a well exam. He feels great. The chest wall pain we saw him for recently has resolved.    Review of Systems  Constitutional: Negative.   HENT: Negative.   Eyes: Negative.   Respiratory: Negative.   Cardiovascular: Negative.   Gastrointestinal: Negative.   Genitourinary: Negative.   Musculoskeletal: Negative.   Skin: Negative.   Neurological: Negative.   Psychiatric/Behavioral: Negative.        Objective:   Physical Exam  Constitutional: He is oriented to person, place, and time. He appears well-developed and well-nourished. No distress.  HENT:  Head: Normocephalic and atraumatic.  Right Ear: External ear normal.  Left Ear: External ear normal.  Nose: Nose normal.  Mouth/Throat: Oropharynx is clear and moist. No oropharyngeal exudate.  Eyes: Conjunctivae and EOM are normal. Pupils are equal, round, and reactive to light. Right eye exhibits no discharge. Left eye exhibits no discharge. No scleral icterus.  Neck: Neck supple. No JVD present. No tracheal deviation present. No thyromegaly present.  Cardiovascular: Normal rate, regular rhythm, normal heart sounds and intact distal pulses. Exam reveals no gallop and no friction rub.  No murmur heard. Pulmonary/Chest: Effort normal and breath sounds normal. No respiratory distress. He has no wheezes. He has no rales. He exhibits no tenderness.  Abdominal: Soft. Bowel sounds are normal. He exhibits no distension and no mass. There is no tenderness. There is no rebound and no guarding.  Musculoskeletal: Normal range of motion. He exhibits no edema or tenderness.  Lymphadenopathy:    He has no cervical adenopathy.  Neurological: He is alert and oriented to person, place, and time. He has normal reflexes. No cranial nerve deficit. He exhibits normal muscle tone. Coordination normal.  Skin: Skin is warm and dry. No rash noted. He  is not diaphoretic. No erythema. No pallor.  Psychiatric: He has a normal mood and affect. His behavior is normal. Judgment and thought content normal.          Assessment & Plan:  Well exam. We discussed diet and exercise. Get fasting labs. He will see his Urologist in a few months.  Alysia Penna, MD

## 2017-08-11 ENCOUNTER — Encounter: Payer: Self-pay | Admitting: Family Medicine

## 2017-08-11 ENCOUNTER — Ambulatory Visit: Payer: BLUE CROSS/BLUE SHIELD | Admitting: Family Medicine

## 2017-08-11 VITALS — BP 100/66 | HR 97 | Temp 98.1°F | Ht 72.0 in | Wt 210.2 lb

## 2017-08-11 DIAGNOSIS — M542 Cervicalgia: Secondary | ICD-10-CM

## 2017-08-11 MED ORDER — CELECOXIB 200 MG PO CAPS: 200.0000 mg | ORAL_CAPSULE | Freq: Two times a day (BID) | ORAL | 5 refills | Status: DC

## 2017-08-11 NOTE — Progress Notes (Signed)
   Subjective:    Patient ID: Riley Hartman, male    DOB: 10/22/1960, 57 y.o.   MRN: [id number]  HPI Here for ongoing stiffness and pain in the neck. He had a cervical spine MRI in 2009 showing a number of degenerative discs. He uses Ibuprofen and heat with limited results.    Review of Systems  Constitutional: Negative.   Respiratory: Negative.   Cardiovascular: Negative.   Musculoskeletal: Positive for neck pain and neck stiffness.       Objective:   Physical Exam  Constitutional: He appears well-developed and well-nourished.  Cardiovascular: Normal rate, regular rhythm, normal heart sounds and intact distal pulses.  Pulmonary/Chest: Effort normal and breath sounds normal.  Musculoskeletal:  Mildly tender in the posterior neck, ROM is limited by pain           Assessment & Plan:  Neck pain, try Celebrex 200 mg bid. We will send him for PT also.  Alysia Penna, MD

## 2017-08-18 ENCOUNTER — Observation Stay (HOSPITAL_COMMUNITY): Payer: BLUE CROSS/BLUE SHIELD

## 2017-08-18 ENCOUNTER — Encounter (HOSPITAL_COMMUNITY): Payer: Self-pay | Admitting: Emergency Medicine

## 2017-08-18 ENCOUNTER — Inpatient Hospital Stay (HOSPITAL_COMMUNITY)
Admission: EM | Admit: 2017-08-18 | Discharge: 2017-08-20 | DRG: 065 | Disposition: A | Discharge status: Rehabilitation Facility | Payer: BLUE CROSS/BLUE SHIELD | Attending: Internal Medicine | Admitting: Internal Medicine

## 2017-08-18 ENCOUNTER — Emergency Department (HOSPITAL_COMMUNITY): Payer: BLUE CROSS/BLUE SHIELD

## 2017-08-18 DIAGNOSIS — G629 Polyneuropathy, unspecified: Secondary | ICD-10-CM | POA: Diagnosis not present

## 2017-08-18 DIAGNOSIS — Z833 Family history of diabetes mellitus: Secondary | ICD-10-CM

## 2017-08-18 DIAGNOSIS — R29706 NIHSS score 6: Secondary | ICD-10-CM | POA: Diagnosis not present

## 2017-08-18 DIAGNOSIS — I6521 Occlusion and stenosis of right carotid artery: Secondary | ICD-10-CM | POA: Diagnosis not present

## 2017-08-18 DIAGNOSIS — H5712 Ocular pain, left eye: Secondary | ICD-10-CM | POA: Diagnosis present

## 2017-08-18 DIAGNOSIS — J349 Unspecified disorder of nose and nasal sinuses: Secondary | ICD-10-CM

## 2017-08-18 DIAGNOSIS — I5189 Other ill-defined heart diseases: Secondary | ICD-10-CM

## 2017-08-18 DIAGNOSIS — F411 Generalized anxiety disorder: Secondary | ICD-10-CM | POA: Diagnosis present

## 2017-08-18 DIAGNOSIS — R4781 Slurred speech: Secondary | ICD-10-CM | POA: Diagnosis not present

## 2017-08-18 DIAGNOSIS — R0789 Other chest pain: Secondary | ICD-10-CM | POA: Diagnosis not present

## 2017-08-18 DIAGNOSIS — I69322 Dysarthria following cerebral infarction: Secondary | ICD-10-CM | POA: Diagnosis not present

## 2017-08-18 DIAGNOSIS — J3489 Other specified disorders of nose and nasal sinuses: Secondary | ICD-10-CM | POA: Diagnosis not present

## 2017-08-18 DIAGNOSIS — G6289 Other specified polyneuropathies: Secondary | ICD-10-CM | POA: Diagnosis not present

## 2017-08-18 DIAGNOSIS — I2510 Atherosclerotic heart disease of native coronary artery without angina pectoris: Secondary | ICD-10-CM | POA: Diagnosis present

## 2017-08-18 DIAGNOSIS — F909 Attention-deficit hyperactivity disorder, unspecified type: Secondary | ICD-10-CM | POA: Diagnosis not present

## 2017-08-18 DIAGNOSIS — E782 Mixed hyperlipidemia: Secondary | ICD-10-CM | POA: Diagnosis not present

## 2017-08-18 DIAGNOSIS — I10 Essential (primary) hypertension: Secondary | ICD-10-CM | POA: Diagnosis present

## 2017-08-18 DIAGNOSIS — I160 Hypertensive urgency: Secondary | ICD-10-CM | POA: Diagnosis not present

## 2017-08-18 DIAGNOSIS — I4510 Unspecified right bundle-branch block: Secondary | ICD-10-CM | POA: Diagnosis present

## 2017-08-18 DIAGNOSIS — G8929 Other chronic pain: Secondary | ICD-10-CM | POA: Diagnosis not present

## 2017-08-18 DIAGNOSIS — R269 Unspecified abnormalities of gait and mobility: Secondary | ICD-10-CM | POA: Diagnosis not present

## 2017-08-18 DIAGNOSIS — R2990 Unspecified symptoms and signs involving the nervous system: Secondary | ICD-10-CM | POA: Diagnosis not present

## 2017-08-18 DIAGNOSIS — R202 Paresthesia of skin: Secondary | ICD-10-CM | POA: Diagnosis not present

## 2017-08-18 DIAGNOSIS — F17210 Nicotine dependence, cigarettes, uncomplicated: Secondary | ICD-10-CM | POA: Diagnosis not present

## 2017-08-18 DIAGNOSIS — H538 Other visual disturbances: Secondary | ICD-10-CM | POA: Diagnosis present

## 2017-08-18 DIAGNOSIS — F419 Anxiety disorder, unspecified: Secondary | ICD-10-CM | POA: Diagnosis present

## 2017-08-18 DIAGNOSIS — R471 Dysarthria and anarthria: Secondary | ICD-10-CM | POA: Diagnosis present

## 2017-08-18 DIAGNOSIS — G894 Chronic pain syndrome: Secondary | ICD-10-CM | POA: Diagnosis present

## 2017-08-18 DIAGNOSIS — R4701 Aphasia: Secondary | ICD-10-CM | POA: Diagnosis present

## 2017-08-18 DIAGNOSIS — G8191 Hemiplegia, unspecified affecting right dominant side: Secondary | ICD-10-CM | POA: Diagnosis not present

## 2017-08-18 DIAGNOSIS — G4733 Obstructive sleep apnea (adult) (pediatric): Secondary | ICD-10-CM | POA: Diagnosis present

## 2017-08-18 DIAGNOSIS — I6501 Occlusion and stenosis of right vertebral artery: Secondary | ICD-10-CM | POA: Diagnosis not present

## 2017-08-18 DIAGNOSIS — I69351 Hemiplegia and hemiparesis following cerebral infarction affecting right dominant side: Secondary | ICD-10-CM | POA: Diagnosis not present

## 2017-08-18 DIAGNOSIS — E785 Hyperlipidemia, unspecified: Secondary | ICD-10-CM | POA: Diagnosis not present

## 2017-08-18 DIAGNOSIS — R270 Ataxia, unspecified: Secondary | ICD-10-CM | POA: Diagnosis present

## 2017-08-18 DIAGNOSIS — R278 Other lack of coordination: Secondary | ICD-10-CM | POA: Diagnosis not present

## 2017-08-18 DIAGNOSIS — Z720 Tobacco use: Secondary | ICD-10-CM | POA: Diagnosis not present

## 2017-08-18 DIAGNOSIS — I639 Cerebral infarction, unspecified: Secondary | ICD-10-CM

## 2017-08-18 DIAGNOSIS — R29810 Facial weakness: Secondary | ICD-10-CM | POA: Diagnosis present

## 2017-08-18 DIAGNOSIS — I6330 Cerebral infarction due to thrombosis of unspecified cerebral artery: Secondary | ICD-10-CM | POA: Diagnosis not present

## 2017-08-18 DIAGNOSIS — N400 Enlarged prostate without lower urinary tract symptoms: Secondary | ICD-10-CM | POA: Diagnosis not present

## 2017-08-18 DIAGNOSIS — I6309 Cerebral infarction due to thrombosis of other precerebral artery: Principal | ICD-10-CM | POA: Diagnosis present

## 2017-08-18 DIAGNOSIS — R531 Weakness: Secondary | ICD-10-CM | POA: Diagnosis not present

## 2017-08-18 DIAGNOSIS — Z8249 Family history of ischemic heart disease and other diseases of the circulatory system: Secondary | ICD-10-CM

## 2017-08-18 DIAGNOSIS — I69392 Facial weakness following cerebral infarction: Secondary | ICD-10-CM | POA: Diagnosis not present

## 2017-08-18 DIAGNOSIS — I672 Cerebral atherosclerosis: Secondary | ICD-10-CM | POA: Diagnosis not present

## 2017-08-18 DIAGNOSIS — I69398 Other sequelae of cerebral infarction: Secondary | ICD-10-CM | POA: Diagnosis not present

## 2017-08-18 DIAGNOSIS — R4020 Unspecified coma: Secondary | ICD-10-CM | POA: Diagnosis not present

## 2017-08-18 DIAGNOSIS — R262 Difficulty in walking, not elsewhere classified: Secondary | ICD-10-CM | POA: Diagnosis not present

## 2017-08-18 DIAGNOSIS — M542 Cervicalgia: Secondary | ICD-10-CM | POA: Diagnosis not present

## 2017-08-18 DIAGNOSIS — Z87442 Personal history of urinary calculi: Secondary | ICD-10-CM | POA: Diagnosis not present

## 2017-08-18 DIAGNOSIS — I5030 Unspecified diastolic (congestive) heart failure: Secondary | ICD-10-CM | POA: Diagnosis not present

## 2017-08-18 HISTORY — DX: Attention-deficit hyperactivity disorder, unspecified type: F90.9

## 2017-08-18 LAB — URINALYSIS, ROUTINE W REFLEX MICROSCOPIC
Bilirubin Urine: NEGATIVE
Glucose, UA: NEGATIVE mg/dL
Hgb urine dipstick: NEGATIVE
Ketones, ur: NEGATIVE mg/dL
Leukocytes, UA: NEGATIVE
Nitrite: NEGATIVE
Protein, ur: NEGATIVE mg/dL
Specific Gravity, Urine: 1.024 (ref 1.005–1.030)
pH: 7 (ref 5.0–8.0)

## 2017-08-18 LAB — COMPREHENSIVE METABOLIC PANEL
ALT: 16 U/L — ABNORMAL LOW (ref 17–63)
AST: 18 U/L (ref 15–41)
Albumin: 3.8 g/dL (ref 3.5–5.0)
Alkaline Phosphatase: 49 U/L (ref 38–126)
Anion gap: 6 (ref 5–15)
BUN: 15 mg/dL (ref 6–20)
CO2: 24 mmol/L (ref 22–32)
Calcium: 8.9 mg/dL (ref 8.9–10.3)
Chloride: 110 mmol/L (ref 101–111)
Creatinine, Ser: 0.94 mg/dL (ref 0.61–1.24)
GFR calc Af Amer: >60 mL/min (ref >60)
GFR calc non Af Amer: >60 mL/min (ref >60)
Glucose, Bld: 106 mg/dL — ABNORMAL HIGH (ref 65–99)
Potassium: 4 mmol/L (ref 3.5–5.1)
Sodium: 140 mmol/L (ref 135–145)
Total Bilirubin: 0.5 mg/dL (ref 0.3–1.2)
Total Protein: 6.2 g/dL — ABNORMAL LOW (ref 6.5–8.1)

## 2017-08-18 LAB — CBC WITH DIFFERENTIAL/PLATELET
Basophils Absolute: 0 10*3/uL (ref 0.0–0.1)
Basophils Relative: 1 %
Eosinophils Absolute: 0.2 10*3/uL (ref 0.0–0.7)
Eosinophils Relative: 2 %
HCT: 41.1 % (ref 39.0–52.0)
Hemoglobin: 13.5 g/dL (ref 13.0–17.0)
Lymphocytes Relative: 27 %
Lymphs Abs: 2.3 10*3/uL (ref 0.7–4.0)
MCH: 29.4 pg (ref 26.0–34.0)
MCHC: 32.8 g/dL (ref 30.0–36.0)
MCV: 89.5 fL (ref 78.0–100.0)
Monocytes Absolute: 0.5 10*3/uL (ref 0.1–1.0)
Monocytes Relative: 6 %
Neutro Abs: 5.6 10*3/uL (ref 1.7–7.7)
Neutrophils Relative %: 64 %
Platelets: 170 10*3/uL (ref 150–400)
RBC: 4.59 MIL/uL (ref 4.22–5.81)
RDW: 13 % (ref 11.5–15.5)
WBC: 8.7 10*3/uL (ref 4.0–10.5)

## 2017-08-18 LAB — I-STAT TROPONIN, ED
Troponin i, poc: 0.01 ng/mL (ref 0.00–0.08)
Troponin i, poc: 0 ng/mL (ref 0.00–0.08)

## 2017-08-18 LAB — TROPONIN I: Troponin I: <0.03 ng/mL (ref <0.03)

## 2017-08-18 LAB — RAPID URINE DRUG SCREEN, HOSP PERFORMED
Amphetamines: NOT DETECTED
Barbiturates: NOT DETECTED
Benzodiazepines: NOT DETECTED
Cocaine: NOT DETECTED
Opiates: NOT DETECTED
Tetrahydrocannabinol: NOT DETECTED

## 2017-08-18 LAB — ETHANOL: Alcohol, Ethyl (B): <10 mg/dL (ref <10)

## 2017-08-18 LAB — PROTIME-INR
INR: 1.02
Prothrombin Time: 13.3 seconds (ref 11.4–15.2)

## 2017-08-18 LAB — APTT: aPTT: 31 seconds (ref 24–36)

## 2017-08-18 MED ORDER — ENOXAPARIN SODIUM 40 MG/0.4ML ~~LOC~~ SOLN
40.0000 mg | SUBCUTANEOUS | Status: DC
  Administered 2017-08-18 – 2017-08-20 (×3): 40 mg via SUBCUTANEOUS
  Filled 2017-08-18 (×3): qty 0.4

## 2017-08-18 MED ORDER — SODIUM CHLORIDE 0.9 % IV SOLN
INTRAVENOUS | Status: DC
Start: 2017-08-18 — End: 2017-08-19
  Administered 2017-08-18: 20:00:00 via INTRAVENOUS

## 2017-08-18 MED ORDER — IOPAMIDOL (ISOVUE-370) INJECTION 76%
INTRAVENOUS | Status: AC
  Administered 2017-08-18: 90 mL
  Filled 2017-08-18: qty 50

## 2017-08-18 MED ORDER — HYDRALAZINE HCL 20 MG/ML IJ SOLN: 10.0000 mg | INTRAMUSCULAR | Status: DC | PRN

## 2017-08-18 MED ORDER — IOPAMIDOL (ISOVUE-370) INJECTION 76%
INTRAVENOUS | Status: AC
  Filled 2017-08-18: qty 50

## 2017-08-18 MED ORDER — ATORVASTATIN CALCIUM 80 MG PO TABS: 80.0000 mg | ORAL_TABLET | Freq: Every day | ORAL | Status: DC

## 2017-08-18 MED ORDER — ACETAMINOPHEN 325 MG PO TABS: 650.0000 mg | ORAL_TABLET | ORAL | Status: DC | PRN

## 2017-08-18 MED ORDER — LORAZEPAM 2 MG/ML IJ SOLN: 0.5000 mg | Freq: Two times a day (BID) | INTRAMUSCULAR | Status: DC | PRN

## 2017-08-18 MED ORDER — ASPIRIN 325 MG PO TABS
325.0000 mg | ORAL_TABLET | Freq: Every day | ORAL | Status: DC
  Administered 2017-08-19: 325 mg via ORAL
  Filled 2017-08-18: qty 1

## 2017-08-18 MED ORDER — STROKE: EARLY STAGES OF RECOVERY BOOK
Freq: Once | Status: AC
  Administered 2017-08-18: 21:00:00
  Filled 2017-08-18: qty 1

## 2017-08-18 MED ORDER — ACETAMINOPHEN 160 MG/5ML PO SOLN: 650.0000 mg | ORAL | Status: DC | PRN

## 2017-08-18 MED ORDER — STROKE: EARLY STAGES OF RECOVERY BOOK
Freq: Once | Status: DC
  Filled 2017-08-18: qty 1

## 2017-08-18 MED ORDER — ACETAMINOPHEN 650 MG RE SUPP: 650.0000 mg | RECTAL | Status: DC | PRN

## 2017-08-18 MED ORDER — ASPIRIN 325 MG PO TABS: 325.0000 mg | ORAL_TABLET | Freq: Every day | ORAL | Status: DC

## 2017-08-18 MED ORDER — ASPIRIN 300 MG RE SUPP
300.0000 mg | Freq: Every day | RECTAL | Status: DC
  Administered 2017-08-18: 300 mg via RECTAL
  Filled 2017-08-18: qty 1

## 2017-08-18 NOTE — ED Provider Notes (Addendum)
  Physical Exam  BP (!) 158/96   Pulse 83   Resp (!) 21   SpO2 95%   Physical Exam  ED Course/Procedures     Procedures  MDM   Assuming care of patient from Dr. Lita Mains. 57 year old male with history of CAD, HTN, HL comes in with chief complaint of chest pain and headache. Patient alleges that at 7:30 in the morning he developed a headache with left sided blurry vision and numbness around his lips and left side of the face.  Soon after he developed right-sided tingling in his arms and felt a little confused.   Workup thus far shows CT head which is negative for any acute process.  EKG has no acute findings and the initial troponin is normal.  Dr. Lita Mains is concerned that this could be a possible TIA or a subclinical stroke given the risk factors.  MRI of the brain has been ordered to rule out a stroke -we have decided to add MR angiogram of the head and neck to further screening for TIA. Important pending results are: MRI results.  According to Dr. Lita Mains, plan is to f/u on MRI and reassess the patient. The chest pain is not typical for ACS, deltra trop requested.     EKG Interpretation  Date/Time:  Tuesday Aug 18 2017 09:19:15 EDT Ventricular Rate:  63 PR Interval:    QRS Duration: 150 QT Interval:  447 QTC Calculation: 458 R Axis:   94 Text Interpretation:  Sinus rhythm RBBB and LPFB Confirmed by Julianne Rice [phone]) on 08/18/2017 12:04:24 PM       1:21 PM Nurse came by to inform me that patient his wife reported that this speech has gotten worse. I went in to reassess the patient any sounds slurred.  In addition, patient states that he is got right-sided facial upper and lower extremity numbness.  Initially patient did have some left-sided nonspecific paresthesias, which have now resolved.  Further exam reveals right upper extremity weakness with mild drift.  Grip strength appears intact.  Patient is slurred but does not have any a aphasia.  Patient was  last normal at 730 a.m. Patient is when positive, we will activate code stroke and have neurology team see him. CT angiograms and perfusion studies have been ordered as well.   Varney Biles, MD 08/18/17 1323  1:42 PM Neuro recommends MRI, admit. No intervention at this time.   Varney Biles, MD 08/18/17 [phone]

## 2017-08-18 NOTE — ED Notes (Signed)
Per MD Lita Mains pt does not meet code stroke criteria.

## 2017-08-18 NOTE — H&P (Signed)
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 The right transverse and sigmoid sinus are dominant. Anatomic variants: Dominant right vertebral artery. The left is diminutive but remains patent to the basilar. Review of the MIP images confirms the above findings IMPRESSION: 1. Negative for large vessel occlusion, and CTP detects no core infarct or penumbra. 2. No atherosclerosis or stenosis in the neck. The Right vertebral artery is dominant. 3. Moderate intracranial atherosclerosis affecting the right vertebral artery and both ICA siphons. Associated moderate Right ICA supraclinoid segment stenosis, and mild to moderate stenosis of the dominant distal Right vertebral artery. 4. Circle-of-Willis branches are within normal limits. 5. These results were communicated to Dr. Cheral Marker at [facility] 08/18/2017 by text page via the [facility] messaging system. Electronically Signed   By: Genevie Ann M.D.   On: 08/18/2017 13:48    EKG: (Independently reviewed) sinus rhythm with ventricular rate 63 bpm, QTC 458 ms, underlying RBBB, normal R wave rotation, no acute ischemic changes  Assessment/Plan Principal Problem:   Stroke-like symptoms -Patient presents with evolving strokelike  symptoms with associated right side tingling and weakness, right facial droop, dysarthria and slurred speech with initial CT head, CTA head neck, and CT perfusion unremarkable -Appreciate neurology assistance -Stat MR/MRA brain -NPO until stroke swallow screen completed -PT/OT/SLP evaluation -HgbA1c/FLP (was not on statin prior to admission-will need to begin Lipitor 80 mg daily if stroke proven) -Full dose aspirin -Frequent neurological checks -Echocardiogram; no indication for carotid duplex since underwent CTA head and neck  -Permissive hypertension -IVF while NPO  Active Problems:   CAD (coronary artery disease) -Nonobstructive CAD found on cardiac catheterization in 2013 -Radiologist recommended aggressive lipid reduction and prevention therapy -Was not  on aspirin, statin or beta-blocker prior to admission    HTN (hypertension) -On lisinopril prior to admission -Allowing for permissive hypertension and will treat with IV hydralazine prn SBP >/= 220    HLD (hyperlipidemia) -Not on pharmacotherapy prior to admission -Follow-up on lipid panel and plans are to initiate high-dose statin during this admission if stroke proven    ADHD -On methylphenidate prior to admission -We will need to discuss with neurology in context of presumed acute stroke when appropriate to resume this medication    Tobacco abuse -Patient quit smoking yesterday -Tobacco cessation counseling during this admission    Anxiety disorder -Klonopin prior to admission -IV Ativan prn while NPO    **Additional lab, imaging and/or diagnostic evaluation at discretion of supervising physician  DVT prophylaxis: Lovenox Code Status: Full Family Communication: Family at bedside Disposition Plan: Home-May benefit from [place] pending PT/OT evaluation Consults called: Neurology/[place]    Kelyn Koskela L. ANP-BC [facility] Pager [phone]   If 7PM-7AM, please contact  night-coverage [email or web address] Password [email or web address]  08/18/2017, 2:40 PM

## 2017-08-18 NOTE — ED Notes (Signed)
Patient transported to MRI 

## 2017-08-18 NOTE — Progress Notes (Signed)
Health relations are involved with Pt and family. They were upset because ED MD and administration will have Speech evaluate the Pt today and when arrived to the floor RN notified the Pt the information was incorrect. Charge Nurse also involved and aware.
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
## 2017-08-18 NOTE — ED Provider Notes (Signed)
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 stenosis. Normal left ophthalmic artery origin. Lesser calcified plaque on the right except in the supraclinoid segment where there is moderate distal right ICA stenosis (series 10, image 85). Normal right ophthalmic artery origin. Patent carotid termini. Normal MCA and ACA origins. Diminutive anterior communicating artery. Bilateral ACA branches are within normal limits. The left MCA M1, bifurcation, and left MCA branches are within normal limits. The right MCA M1, trifurcation, and right MCA branches are within normal limits. Venous sinuses: Patent. The right transverse and sigmoid sinus are dominant. Anatomic variants: Dominant right vertebral artery. The left is diminutive but remains patent to the basilar. Review of the MIP images confirms the above findings IMPRESSION: 1. Negative for large vessel occlusion, and CTP detects no core infarct or penumbra. 2. No atherosclerosis or stenosis in the neck. The Right vertebral artery is dominant. 3. Moderate intracranial atherosclerosis affecting the right vertebral artery and both ICA siphons. Associated moderate Right ICA supraclinoid segment stenosis, and mild to moderate stenosis of  the dominant distal Right vertebral artery. 4. Circle-of-Willis branches are within normal limits. 5. These results were communicated to Dr. Cheral Marker at [facility] 08/18/2017 by text page via the [facility] messaging system. Electronically Signed   By: Genevie Ann M.D.   On: 08/18/2017 13:48    Procedures Procedures (including critical care time)  Medications Ordered in ED Medications  iopamidol (ISOVUE-370) 76 % injection (has no administration in time range)  hydrALAZINE (APRESOLINE) injection 10 mg (has no administration in time range)  0.9 %  sodium chloride infusion ( Intravenous New Bag/Given 08/18/17 2005)  acetaminophen (TYLENOL) tablet 650 mg (has no administration in time range)    Or  acetaminophen (TYLENOL) solution 650 mg (has no administration in time range)    Or  acetaminophen (TYLENOL) suppository 650 mg (has no administration in time range)  enoxaparin (LOVENOX) injection 40 mg (40 mg Subcutaneous Given 08/18/17 1733)  aspirin suppository 300 mg (300 mg Rectal Given 08/18/17 2112)    Or  aspirin tablet 325 mg ( Oral See Alternative 08/18/17 2112)  LORazepam (ATIVAN) injection 0.5 mg (has no administration in time range)  iopamidol (ISOVUE-370) 76 % injection (90 mLs  Contrast Given 08/18/17 1300)   stroke: mapping our early stages of recovery book ( Does not apply Given 08/18/17 2112)     Initial Impression / Assessment and Plan /  ED Course  I have reviewed the triage vital signs and the nursing notes.  Pertinent labs & imaging results that were available during my care of the patient were reviewed by me and considered in my medical decision making (see chart for details).     CT head without findings to explain patient's symptoms.  Sister is at bedside states the patient is now having slurred speech.  Patient with intermittent mumbling but then clearing of his speech.  No definite neurologic deficit.  Will get MRI and will need repeat troponin.  Signed out to Dr. Kathrynn Humble.  Final  Clinical Impressions(s) / ED Diagnoses   Final diagnoses:  Sinus disease  Cerebrovascular accident (CVA), unspecified mechanism [facility])    ED Discharge Orders    None       Julianne Rice, MD 08/19/17 [phone]

## 2017-08-18 NOTE — ED Notes (Signed)
Pt speech has become slurred objectively. MD Kathrynn Humble notified and re evaluating the pt. The MRI transport was here for the pt but per MD we will hold off until neurology can evaluate.
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
## 2017-08-18 NOTE — ED Notes (Signed)
Code Stroke activated per Dr. Pincus Sanes request

## 2017-08-18 NOTE — Code Documentation (Signed)
57 yo male coming from driving to work with complaints of sudden onset of left eye pain followed by left upper lip decreased sensation and then right arm decreased sensation. Pt arrived and was evaluated by EDP. At 1230, EDP called Code Stroke due to patient having slurred speech. Stroke Team met patient in CT. NIHSS 5 due to right arm drift, right sensory decreased, bilateral ataxia, and dysarthria. Pt alert and oriented x4. CTA/CTP completed. No tPA due to patient be outside of window. No LVO noted on scan. Handoff given to Vicente Males, [place]

## 2017-08-19 ENCOUNTER — Observation Stay (HOSPITAL_COMMUNITY): Payer: BLUE CROSS/BLUE SHIELD

## 2017-08-19 ENCOUNTER — Other Ambulatory Visit: Payer: Self-pay

## 2017-08-19 ENCOUNTER — Encounter (HOSPITAL_COMMUNITY): Payer: Self-pay

## 2017-08-19 DIAGNOSIS — I4510 Unspecified right bundle-branch block: Secondary | ICD-10-CM | POA: Diagnosis present

## 2017-08-19 DIAGNOSIS — F909 Attention-deficit hyperactivity disorder, unspecified type: Secondary | ICD-10-CM | POA: Diagnosis not present

## 2017-08-19 DIAGNOSIS — Z720 Tobacco use: Secondary | ICD-10-CM

## 2017-08-19 DIAGNOSIS — I2510 Atherosclerotic heart disease of native coronary artery without angina pectoris: Secondary | ICD-10-CM | POA: Diagnosis not present

## 2017-08-19 DIAGNOSIS — I5189 Other ill-defined heart diseases: Secondary | ICD-10-CM | POA: Diagnosis not present

## 2017-08-19 DIAGNOSIS — I6330 Cerebral infarction due to thrombosis of unspecified cerebral artery: Secondary | ICD-10-CM | POA: Diagnosis not present

## 2017-08-19 DIAGNOSIS — G8191 Hemiplegia, unspecified affecting right dominant side: Secondary | ICD-10-CM | POA: Diagnosis not present

## 2017-08-19 DIAGNOSIS — G6289 Other specified polyneuropathies: Secondary | ICD-10-CM | POA: Diagnosis not present

## 2017-08-19 DIAGNOSIS — I5030 Unspecified diastolic (congestive) heart failure: Secondary | ICD-10-CM

## 2017-08-19 DIAGNOSIS — I6501 Occlusion and stenosis of right vertebral artery: Secondary | ICD-10-CM | POA: Diagnosis present

## 2017-08-19 DIAGNOSIS — H538 Other visual disturbances: Secondary | ICD-10-CM | POA: Diagnosis present

## 2017-08-19 DIAGNOSIS — I10 Essential (primary) hypertension: Secondary | ICD-10-CM

## 2017-08-19 DIAGNOSIS — E782 Mixed hyperlipidemia: Secondary | ICD-10-CM | POA: Diagnosis not present

## 2017-08-19 DIAGNOSIS — F17210 Nicotine dependence, cigarettes, uncomplicated: Secondary | ICD-10-CM | POA: Diagnosis present

## 2017-08-19 DIAGNOSIS — I69322 Dysarthria following cerebral infarction: Secondary | ICD-10-CM | POA: Diagnosis not present

## 2017-08-19 DIAGNOSIS — G4733 Obstructive sleep apnea (adult) (pediatric): Secondary | ICD-10-CM | POA: Diagnosis present

## 2017-08-19 DIAGNOSIS — G894 Chronic pain syndrome: Secondary | ICD-10-CM | POA: Diagnosis present

## 2017-08-19 DIAGNOSIS — H5712 Ocular pain, left eye: Secondary | ICD-10-CM | POA: Diagnosis present

## 2017-08-19 DIAGNOSIS — I6309 Cerebral infarction due to thrombosis of other precerebral artery: Secondary | ICD-10-CM | POA: Diagnosis present

## 2017-08-19 DIAGNOSIS — G629 Polyneuropathy, unspecified: Secondary | ICD-10-CM

## 2017-08-19 DIAGNOSIS — R29706 NIHSS score 6: Secondary | ICD-10-CM | POA: Diagnosis present

## 2017-08-19 DIAGNOSIS — R270 Ataxia, unspecified: Secondary | ICD-10-CM | POA: Diagnosis present

## 2017-08-19 DIAGNOSIS — R471 Dysarthria and anarthria: Secondary | ICD-10-CM | POA: Diagnosis present

## 2017-08-19 DIAGNOSIS — F411 Generalized anxiety disorder: Secondary | ICD-10-CM | POA: Diagnosis present

## 2017-08-19 DIAGNOSIS — R2990 Unspecified symptoms and signs involving the nervous system: Secondary | ICD-10-CM | POA: Diagnosis not present

## 2017-08-19 DIAGNOSIS — I69398 Other sequelae of cerebral infarction: Secondary | ICD-10-CM | POA: Diagnosis not present

## 2017-08-19 DIAGNOSIS — R4781 Slurred speech: Secondary | ICD-10-CM | POA: Diagnosis present

## 2017-08-19 DIAGNOSIS — R269 Unspecified abnormalities of gait and mobility: Secondary | ICD-10-CM | POA: Diagnosis not present

## 2017-08-19 DIAGNOSIS — N400 Enlarged prostate without lower urinary tract symptoms: Secondary | ICD-10-CM | POA: Diagnosis present

## 2017-08-19 DIAGNOSIS — I160 Hypertensive urgency: Secondary | ICD-10-CM | POA: Diagnosis present

## 2017-08-19 DIAGNOSIS — R4701 Aphasia: Secondary | ICD-10-CM | POA: Diagnosis present

## 2017-08-19 DIAGNOSIS — I6521 Occlusion and stenosis of right carotid artery: Secondary | ICD-10-CM | POA: Diagnosis present

## 2017-08-19 DIAGNOSIS — E785 Hyperlipidemia, unspecified: Secondary | ICD-10-CM | POA: Diagnosis present

## 2017-08-19 DIAGNOSIS — R29810 Facial weakness: Secondary | ICD-10-CM | POA: Diagnosis present

## 2017-08-19 DIAGNOSIS — I639 Cerebral infarction, unspecified: Secondary | ICD-10-CM | POA: Diagnosis not present

## 2017-08-19 LAB — ECHOCARDIOGRAM COMPLETE
Height: 72 in
Weight: 3400.38 oz

## 2017-08-19 LAB — HEMOGLOBIN A1C
Hgb A1c MFr Bld: 5.6 % (ref 4.8–5.6)
Mean Plasma Glucose: 114.02 mg/dL

## 2017-08-19 LAB — LIPID PANEL
Cholesterol: 208 mg/dL — ABNORMAL HIGH (ref 0–200)
HDL: 29 mg/dL — ABNORMAL LOW (ref >40)
LDL Cholesterol: 109 mg/dL — ABNORMAL HIGH (ref 0–99)
Total CHOL/HDL Ratio: 7.2 RATIO
Triglycerides: 349 mg/dL — ABNORMAL HIGH (ref <150)
VLDL: 70 mg/dL — ABNORMAL HIGH (ref 0–40)

## 2017-08-19 LAB — HIV ANTIBODY (ROUTINE TESTING W REFLEX): HIV Screen 4th Generation wRfx: NONREACTIVE

## 2017-08-19 MED ORDER — FINASTERIDE 5 MG PO TABS
5.0000 mg | ORAL_TABLET | Freq: Every day | ORAL | Status: DC
  Administered 2017-08-19 – 2017-08-20 (×2): 5 mg via ORAL
  Filled 2017-08-19 (×2): qty 1

## 2017-08-19 MED ORDER — ATORVASTATIN CALCIUM 80 MG PO TABS
80.0000 mg | ORAL_TABLET | Freq: Every day | ORAL | Status: DC
  Administered 2017-08-19: 80 mg via ORAL
  Filled 2017-08-19: qty 1

## 2017-08-19 MED ORDER — CLOPIDOGREL BISULFATE 75 MG PO TABS
75.0000 mg | ORAL_TABLET | Freq: Every day | ORAL | Status: DC
Start: 2017-08-19 — End: 2017-08-20
  Administered 2017-08-19 – 2017-08-20 (×2): 75 mg via ORAL
  Filled 2017-08-19 (×2): qty 1

## 2017-08-19 MED ORDER — CLONAZEPAM 1 MG PO TABS: 1.0000 mg | ORAL_TABLET | Freq: Two times a day (BID) | ORAL | Status: DC | PRN

## 2017-08-19 MED ORDER — TAMSULOSIN HCL 0.4 MG PO CAPS
0.4000 mg | ORAL_CAPSULE | Freq: Every day | ORAL | Status: DC
  Administered 2017-08-19: 0.4 mg via ORAL
  Filled 2017-08-19: qty 1

## 2017-08-19 MED ORDER — ASPIRIN EC 81 MG PO TBEC
81.0000 mg | DELAYED_RELEASE_TABLET | Freq: Every day | ORAL | Status: DC
  Administered 2017-08-20: 81 mg via ORAL
  Filled 2017-08-19: qty 1

## 2017-08-19 NOTE — Evaluation (Signed)
Physical Therapy Evaluation Patient Details Name: Riley Hartman MRN: [id number] DOB: [birth year] Today's Date: 08/19/2017   History of Present Illness  57 y.o. male with medical history significant for nonobstructive CAD based on cardiac catheterization 2013, dyslipidemia, HTN, ADHD, and anxiety disorder.  Patient presented to the ER this morning via EMS after developing burning in the left side of his lip and pain in the left eye without visual disturbance. Developed right-sided tingling and weakness. Initial CT of the head was unremarkable for acute stroke.  Symptoms persisted and he subsequently developed dysarthria and word finding difficulties as well as slurred speech. 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 without hemorrhage or mass.  Clinical Impression  Patient presents with decreased mobility due to weakness/incoordination on R side, decreased safety awareness, decreased balance and will benefit from skilled PT in the acute setting to allow return home with family support following CIR level rehab stay.  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      Follow Up Recommendations CIR    Equipment Recommendations  Other (comment)(TBA)    Recommendations for Other Services Rehab consult     Precautions / Restrictions Precautions Precautions: Fall Restrictions Weight Bearing Restrictions: No      Mobility  Bed Mobility Overal bed mobility: Needs Assistance Bed Mobility: Rolling;Sidelying to Sit Rolling: Min guard Sidelying to sit: Min guard       General bed mobility comments: sitting at EOB  Transfers Overall transfer level: Needs assistance Equipment used: Rolling walker (2 wheeled) Transfers: Sit to/from Stand Sit to Stand: Mod assist Stand pivot transfers: Min assist       General transfer comment: lifting help from EOB, cues for hand  placement  Ambulation/Gait Ambulation/Gait assistance: Mod assist Ambulation Distance (Feet): 20 Feet Assistive device: Rolling walker (2 wheeled) Gait Pattern/deviations: Step-to pattern;Step-through pattern;Decreased stride length   Gait velocity interpretation: <1.31 ft/sec, indicative of household ambulator General Gait Details: impaired sequencing/timing R LE with knee hyperextension in stance with R hip internal rotation and adduction, assist to turn walker and for balance  Stairs            Wheelchair Mobility    Modified Rankin (Stroke Patients Only) Modified Rankin (Stroke Patients Only) Pre-Morbid Rankin Score: No symptoms Modified Rankin: Moderately severe disability     Balance Overall balance assessment: Needs assistance Sitting-balance support: No upper extremity supported;Feet supported Sitting balance-Leahy Scale: Fair     Standing balance support: Bilateral upper extremity supported;During functional activity Standing balance-Leahy Scale: Poor Standing balance comment: Reliant on UE support and physical A                             Pertinent Vitals/Pain Pain Assessment: No/denies pain    Home Living Family/patient expects to be discharged to:: Private residence Living Arrangements: Spouse/significant other Available Help at Discharge: Family;Available PRN/intermittently Type of Home: House Home Access: Stairs to enter Entrance Stairs-Rails: Chemical engineer of Steps: 6 Home Layout: One level Home Equipment: Shower seat - built in;Cane - single point      Prior Function Level of Independence: Independent         Comments: ADLs, IADLs, driving, 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 Pt with slow movements. Able to perform full ROM for shoulder, elbow, wrist, and hand. Finger opposition from 2-4 digit. Weak grasp strength.  RUE Sensation: (Reports numbess throughout RUE) RUE Coordination: decreased fine motor;decreased gross motor    Lower Extremity Assessment Lower Extremity Assessment: RLE deficits/detail RLE Deficits / Details: AROM grossly WFL, strength 3/5 with decreased coordination RLE Sensation: decreased light touch(tingling)    Cervical / Trunk Assessment Cervical / Trunk Assessment: Normal  Communication   Communication: Expressive difficulties  Cognition Arousal/Alertness: Awake/alert Behavior During Therapy: WFL for tasks assessed/performed Overall Cognitive Status: Within Functional Limits for tasks assessed                                 General Comments: WFL for ADLs completed. Following commands. Decreased awareness of funcitonal abilities and benefits from verbal encouragement during ADLs, transfers, and ROM.       General Comments General comments (skin integrity, edema, etc.): Wife present throughout session. Wife reporting feeling dizzy because she hates seeing pt with funcitonal limitation. Discussed importance of pt performing funcitonal tasks to facilitate recover.     Exercises Other Exercises Other Exercises: Educating pt and wife of elevation of RUE to present edema Other Exercises: Providing pt with education on ROM excercises with RUE. Providing verbal and visual instruction for shoulder flexion (5 reps) and compensite finger flexion/extension (10 reps) every hour. Pt verbalized understanding   Assessment/Plan    PT Assessment Patient needs continued PT services  PT Problem List Decreased balance;Decreased knowledge of use of DME;Decreased coordination;Decreased knowledge of precautions;Decreased safety awareness;Decreased mobility;Decreased strength       PT Treatment Interventions DME  instruction;Therapeutic activities;Gait training;Therapeutic exercise;Patient/family education;Balance training;Stair training;Functional mobility training    PT Goals (Current goals can be found in the Care Plan section)  Acute Rehab PT Goals Patient Stated Goal: Return to PLOF PT Goal Formulation: With patient/family Time For Goal Achievement: 09/02/17 Potential to Achieve Goals: Good    Frequency Min 4X/week   Barriers to discharge        Co-evaluation               AM-PAC PT "6 Clicks" Daily Activity  Outcome Measure Difficulty turning over in bed (including adjusting bedclothes, sheets and blankets)?: A Lot Difficulty moving from lying on back to sitting on the side of the bed? : Unable Difficulty sitting down on and standing up from a chair with arms (e.g., wheelchair, bedside commode, etc,.)?: Unable Help needed moving to and from a bed to chair (including a wheelchair)?: A Lot Help needed walking in hospital room?: A Lot Help needed climbing 3-5 steps with a railing? : Total 6 Click Score: 9    End of Session Equipment Utilized During Treatment: Gait belt Activity Tolerance: Patient tolerated treatment well Patient left: with call bell/phone within reach;in chair;with family/visitor present   PT Visit Diagnosis: Other abnormalities of gait and mobility (R26.89);Other symptoms and signs involving the nervous system (R29.898);Hemiplegia and hemiparesis Hemiplegia - Right/Left: Right Hemiplegia - dominant/non-dominant: Dominant Hemiplegia - caused by: Cerebral infarction    Time: 4481-8563 PT Time Calculation (min) (ACUTE ONLY): 21 min   Charges:   PT Evaluation $PT Eval Moderate Complexity: 1 Mod     PT G CodesMagda Kiel, [place] [phone] 08/19/2017   Reginia Naas 08/19/2017, 12:40 PM

## 2017-08-19 NOTE — Progress Notes (Signed)
PROGRESS NOTE    Riley Hartman  OVF:[id number] DOB: [birth year] DOA: 08/18/2017 PCP: Laurey Morale, MD    Brief Narrative:  57 year old male who presented with strokelike symptoms.  Patient does have a significant past medical history for coronary artery disease, lipidemia, hypertension, anxiety disorder, attention deficit disorder.  He presented with a burning sensation in the left side of his leg and pain in the left eye with outpatient disturbance, progressed into right-sided paresthesias affecting the arm and leg, along with dysarthria and aphasia.  Initial physical examination blood pressure 142/97, heart rate 68, respiratory rate 22, oxygen saturation 96%.  Moist mucous membranes, lungs are clear to auscultation bilaterally, heart S1-S2 present and rhythmic, the abdomen was soft nontender, no lower extremity edema.  He had some slurred speech, dysarthria and right facial droop.  Positive decreased strength in the right upper and lower extremities (4 out of 5).  Patient was admitted to the hospital working diagnosis of focal neurologic deficit, rule out CVA   Assessment & Plan:   Principal Problem:   Stroke-like symptoms Active Problems:   CAD (coronary artery disease)   HTN (hypertension)   HLD (hyperlipidemia)   ADHD   Tobacco abuse   Anxiety disorder   Cerebral thrombosis with cerebral infarction   1.  Acute infarct of the left pons without hemorrhage or mass-effect. Patient with persistent right sided weakness, will continue antiplatelet therapy, statin and dvt prophylaxis. Blood pressure control. Follow on physical therapy recommendations. Further work up with CTA with moderate intracranial atherosclerosis at the right vertebral artery and both ICA siphons. Echocardiogram with normal LV systolic function. Lipid pane with LDL 109 and HDL 29, total cholesterol 208.    2.  Uncontrolled hypertension. Will avoid tight control in the first 72 hours, post MI, will continue with as  needed hydralazine. Current blood pressure 841 to 660 systolic. At home on 40 mg of lisinopril.   3.  Coronary artery disease. No chest pain, continue asa.   4.  Tobacco abuse. Smoking cessation counseling.   5. BPH. Continue tamsulosin and finesteride.   6. Anxiety. Resume as needed clonazepam.    DVT prophylaxis: enoxaparin   Code Status: full Family Communication: I spoke with patient's wife at the bedside and all questions were addressed.  Disposition Plan: inpatient rehab   Consultants:   Neurology   Procedures:     Antimicrobials:       Subjective: Patient complains of persistent weakness and paresthesias on the the right side along with dysarthria, no aphasia.   Objective: Vitals:   08/18/17 2016 08/18/17 2349 08/19/17 0410 08/19/17 0822  BP: (!) 138/94 138/85 (!) 134/93 (!) 158/93  Pulse: 66 (!) 59 64 (!) 58  Resp: 20 20 20 20   Temp: 98.2 F (36.8 C) 97.9 F (36.6 C) 97.6 F (36.4 C) 97.6 F (36.4 C)  TempSrc: Oral Oral Oral Oral  SpO2: 97% 96% 96% 98%  Weight:      Height:        Intake/Output Summary (Last 24 hours) at 08/19/2017 1016 Last data filed at 08/19/2017 0800 Gross per 24 hour  Intake 818.75 ml  Output 450 ml  Net 368.75 ml   Filed Weights   08/18/17 2013  Weight: 96.4 kg (212 lb 8.4 oz)    Examination:   General: Not in pain or dyspnea.  Neurology: Awake and alert, right sided weakness upper and lower extremities, 4/5.  E ENT: no pallor, no icterus, oral mucosa moist Cardiovascular: No JVD.  S1-S2 present, rhythmic, no gallops, rubs, or murmurs. No lower extremity edema. Pulmonary: vesicular breath sounds bilaterally, adequate air movement, no wheezing, rhonchi or rales. Gastrointestinal. Abdomen with no organomegaly, non tender, no rebound or guarding Skin. No rashes Musculoskeletal: no joint deformities     Data Reviewed: I have personally reviewed following labs and imaging studies  CBC: Recent Labs  Lab  08/18/17 0926  WBC 8.7  NEUTROABS 5.6  HGB 13.5  HCT 41.1  MCV 89.5  PLT 665   Basic Metabolic Panel: Recent Labs  Lab 08/18/17 0926  NA 140  K 4.0  CL 110  CO2 24  GLUCOSE 106*  BUN 15  CREATININE 0.94  CALCIUM 8.9   GFR: Estimated Creatinine Clearance: 103.1 mL/min (by C-G formula based on SCr of 0.94 mg/dL). Liver Function Tests: Recent Labs  Lab 08/18/17 0926  AST 18  ALT 16*  ALKPHOS 49  BILITOT 0.5  PROT 6.2*  ALBUMIN 3.8   No results for input(s): LIPASE, AMYLASE in the last 168 hours. No results for input(s): AMMONIA in the last 168 hours. Coagulation Profile: Recent Labs  Lab 08/18/17 0926  INR 1.02   Cardiac Enzymes: Recent Labs  Lab 08/18/17 0926  TROPONINI <0.03   BNP (last 3 results) No results for input(s): PROBNP in the last 8760 hours. HbA1C: Recent Labs    08/19/17 0627  HGBA1C 5.6   CBG: No results for input(s): GLUCAP in the last 168 hours. Lipid Profile: Recent Labs    08/19/17 0627  CHOL 208*  HDL 29*  LDLCALC 109*  TRIG 349*  CHOLHDL 7.2   Thyroid Function Tests: No results for input(s): TSH, T4TOTAL, FREET4, T3FREE, THYROIDAB in the last 72 hours. Anemia Panel: No results for input(s): VITAMINB12, FOLATE, FERRITIN, TIBC, IRON, RETICCTPCT in the last 72 hours.    Radiology Studies: I have reviewed all of the imaging during this hospital visit personally     Scheduled Meds: . aspirin  300 mg Rectal Daily   Or  . aspirin  325 mg Oral Daily  . enoxaparin (LOVENOX) injection  40 mg Subcutaneous Q24H   Continuous Infusions: . sodium chloride 75 mL/hr at 08/18/17 2005     LOS: 0 days        Tawni Millers, MD [facility] Pager [phone]

## 2017-08-19 NOTE — Evaluation (Signed)
Occupational Therapy Evaluation Patient Details Name: Riley Hartman MRN: [id number] DOB: [birth year] Today's Date: 08/19/2017    History of Present Illness 57 y.o. male with medical history significant for nonobstructive CAD based on cardiac catheterization 2013, dyslipidemia, HTN, ADHD, and anxiety disorder.  Patient presented to the ER this morning via EMS after developing burning in the left side of his lip and pain in the left eye without visual disturbance. Developed right-sided tingling and weakness. Initial CT of the head was unremarkable for acute stroke.  Symptoms persisted and he subsequently developed dysarthria and word finding difficulties as well as slurred speech. 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 without hemorrhage or mass.   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 Pt currently requiring Min A for UB ADLs, Min-Mod A for LB ADLs, and Min-Mod A for functional transfers with RW. Pt presenting with decreased balance, poor functional use of RUE, and dysarthria requiring increased time throughout session for ADLs and functional mobility. Pt presenting with high motivation to increase functional performance and provided him with exercises for RUE; pt verbalized understanding. Pt would benefit from further acute OT to increase occupational performance and facilitate safe dc. Due to pt's age, deficits, and high motivation, recommend dc to CIR for further intensive OT to optimize safety, independence with ADLs, and return to PLOF.      Follow Up Recommendations  CIR;Supervision/Assistance - 24 hour    Equipment Recommendations  Other (comment)(Defer to next venue)    Recommendations for Other Services Rehab consult;PT consult;Speech consult     Precautions / Restrictions Precautions Precautions: Fall Restrictions Weight Bearing Restrictions: No      Mobility Bed Mobility Overal bed  mobility: Needs Assistance Bed Mobility: Rolling;Sidelying to Sit Rolling: Min guard Sidelying to sit: Min guard       General bed mobility comments: Min Guard for safety and VC for sequencing and hand placement.   Transfers Overall transfer level: Needs assistance Equipment used: 1 person hand held assist;Rolling walker (2 wheeled) Transfers: Sit to/from [employer] Sit to Stand: Mod assist;Min assist Stand pivot transfers: Min assist       General transfer comment: Pt performing inital sit<>Stand with Mod A and single hand held A. Pt able to power up into standing and noted buckling of right knee. Pt transitioning to full weight bearing through RLE without buckling. Transition to standing with RW and Min A for balance. Pt able to march in place with MIn A for balance and safety. Pt performing second sit<>Stand and stand pivot to recliner with RW and Min A for balance. Pt benefits fro mVCs for sequenncing to increase safety and success.    Balance Overall balance assessment: Needs assistance Sitting-balance support: No upper extremity supported;Feet supported Sitting balance-Leahy Scale: Fair     Standing balance support: Bilateral upper extremity supported;During functional activity Standing balance-Leahy Scale: Poor Standing balance comment: Reliant on UE support and physical A                           ADL either performed or assessed with clinical judgement   ADL Overall ADL's : Needs assistance/impaired Eating/Feeding: Set up;Sitting   Grooming: Set up;Supervision/safety;Sitting Grooming Details (indicate cue type and reason): Significant amount of time and poor functional use of RUE. Poor FM skills, coorindation, and grasp strength Upper Body Bathing: Minimal assistance;Sitting   Lower  Body Bathing: Minimal assistance;+2 for physical assistance;Sit to/from stand   Upper Body Dressing : Minimal assistance;Sitting   Lower Body Dressing: +2  for physical assistance;With caregiver independent assisting;Sit to/from stand;Moderate assistance Lower Body Dressing Details (indicate cue type and reason): Pt donning shorts with Min A +2. Pt's wife donning pants over feet. Pt requiring Mod A to maintain standing balance while wife assists to bring pants over hips.Pt able to doff/don left sock at EOB by bringing ankle to knee. Demonstrating poor FM skills, pinch strength, and cooridnation of RUE (dominant hand) and required signfiicant amount of time to complete task Toilet Transfer: Minimal assistance;Stand-pivot;RW(simulated to recliner) Toilet Transfer Details (indicate cue type and reason): Pt performing stand pivot to recliner with Min A for balance. Pt requiring increased cues for safety and sequencing of task.          Functional mobility during ADLs: Minimal assistance;Cueing for safety;Cueing for sequencing;Rolling walker(stand pivot only) General ADL Comments: Pt presenting with poor balance, funcitonal use of right dominant hand, and expressive speech. Pt requiring signifcant amount of time throughout session to complete ADLs and fucntional transfers. Providing pt with education on learned non-use post CVA and importance of using RUE throughout ADLs despite difficulty; pt verbalized understanding     Vision Baseline Vision/History: Wears glasses Wears Glasses: At all times Patient Visual Report: No change from baseline Vision Assessment?: Yes Eye Alignment: Within Functional Limits Ocular Range of Motion: Within Functional Limits Alignment/Gaze Preference: Within Defined Limits Tracking/Visual Pursuits: Able to track stimulus in all quads without difficulty;Decreased smoothness of horizontal tracking;Decreased smoothness of vertical tracking(jumping during tracking from right visual field to center) Convergence: Within functional limits Additional Comments: Noted decreased smooth tracking during gaze to right visual field. Need to  perform further assessment.     Perception     Praxis      Pertinent Vitals/Pain Pain Assessment: No/denies pain     Hand Dominance Right   Extremity/Trunk Assessment Upper Extremity Assessment Upper Extremity Assessment: RUE deficits/detail RUE Deficits / Details: Decreased FM and gross motor coordination. Pt with slow movements. Able to perform full ROM for shoulder, elbow, wrist, and hand. Finger opposition from 2-4 digit. Weak grasp strength.  RUE Sensation: (Reports numbess throughout RUE) RUE Coordination: decreased fine motor;decreased gross motor   Lower Extremity Assessment Lower Extremity Assessment: Defer to PT evaluation   Cervical / Trunk Assessment Cervical / Trunk Assessment: Normal   Communication Communication Communication: Expressive difficulties   Cognition Arousal/Alertness: Awake/alert Behavior During Therapy: WFL for tasks assessed/performed Overall Cognitive Status: Within Functional Limits for tasks assessed                                 General Comments: WFL for ADLs completed. Following commands. Decreased awareness of funcitonal abilities and benefits from verbal encouragement during ADLs, transfers, and ROM.    General Comments  Wife present throughout session. Wife reporting feeling dizzy because she hates seeing pt with funcitonal limitation. Discussed importance of pt performing funcitonal tasks to facilitate recover.     Exercises Exercises: Other exercises Other Exercises Other Exercises: Educating pt and wife of elevation of RUE to present edema Other Exercises: Providing pt with education on ROM excercises with RUE. Providing verbal and visual instruction for shoulder flexion (5 reps) and compensite finger flexion/extension (10 reps) every hour. Pt verbalized understanding   Shoulder Instructions      Home Living Family/patient expects to be discharged to::  Private residence Living Arrangements: Spouse/significant  other Available Help at Discharge: Family;Available PRN/intermittently Type of Home: House Home Access: Stairs to enter [employer] of Steps: 6 Entrance Stairs-Rails: Left;Right Home Layout: One level     Bathroom Shower/Tub: Occupational psychologist: Standard     Home Equipment: Shower seat - built in;Cane - single point          Prior Functioning/Environment Level of Independence: Independent        Comments: ADLs, IADLs, driving, 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        OT Problem List: Decreased strength;Decreased range of motion;Decreased activity tolerance;Impaired balance (sitting and/or standing);Decreased coordination;Decreased safety awareness;Decreased knowledge of use of DME or AE;Decreased knowledge of precautions;Impaired UE functional use;Impaired sensation      OT Treatment/Interventions: Self-care/ADL training;Therapeutic exercise;Energy conservation;DME and/or AE instruction;Therapeutic activities;Patient/family education;Balance training    OT Goals(Current goals can be found in the care plan section) Acute Rehab OT Goals Patient Stated Goal: Return to PLOF OT Goal Formulation: With patient/family Time For Goal Achievement: 09/02/17 Potential to Achieve Goals: Good ADL Goals Pt Will Perform Grooming: standing;with set-up;with supervision Pt Will Perform Upper Body Dressing: with set-up;with supervision;sitting Pt Will Perform Lower Body Dressing: with set-up;with supervision;sit to/from stand Pt Will Transfer to Toilet: ambulating;bedside commode;with set-up;with supervision  OT Frequency: Min 3X/week   Barriers to D/C:            Co-evaluation              AM-PAC PT "6 Clicks" Daily Activity     Outcome Measure Help from another person eating meals?: A Little Help from another person taking care of personal grooming?: A Little Help from another person toileting, which includes using toliet, bedpan, or  urinal?: A Lot Help from another person bathing (including washing, rinsing, drying)?: A Little Help from another person to put on and taking off regular upper body clothing?: A Little Help from another person to put on and taking off regular lower body clothing?: A Lot 6 Click Score: 16   End of Session Equipment Utilized During Treatment: Gait belt;Rolling walker Nurse Communication: Mobility status  Activity Tolerance: Patient tolerated treatment well Patient left: in chair;with call bell/phone within reach;with family/visitor present  OT Visit Diagnosis: Unsteadiness on feet (R26.81);Other abnormalities of gait and mobility (R26.89);Muscle weakness (generalized) (M62.81);Hemiplegia and hemiparesis Hemiplegia - Right/Left: Right Hemiplegia - dominant/non-dominant: Dominant Hemiplegia - caused by: Cerebral infarction                Time: 9604-5409 OT Time Calculation (min): 26 min Charges:  OT General Charges $OT Visit: 1 Visit OT Evaluation $OT Eval Moderate Complexity: 1 Mod OT Treatments $Self Care/Home Management : 8-22 mins G-Codes:     Wentworth Edelen MSOT, OTR/L Acute Rehab Pager: [phone] Office: [place] 08/19/2017, 11:46 AM
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
## 2017-08-19 NOTE — Consult Note (Signed)
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 intracranial MRA. Electronically Signed   By: Ulyses Jarred M.D.   On: 08/18/2017 15:42   Ct Cerebral Perfusion W Contrast  Result Date: 08/18/2017 CLINICAL DATA:  57 year old male with numbness, tingling, paresthesia. Slurred speech. Code stroke. EXAM: CT ANGIOGRAPHY HEAD AND NECK CT PERFUSION BRAIN TECHNIQUE: Multidetector CT imaging of the head and neck was performed using the standard protocol during bolus administration of intravenous contrast. Multiplanar CT image reconstructions and MIPs were obtained to evaluate the vascular anatomy. Carotid stenosis measurements (when applicable) are obtained utilizing NASCET criteria, using the distal internal carotid diameter as the denominator. Multiphase CT imaging of the brain was performed following IV bolus contrast injection. Subsequent parametric perfusion maps were calculated using RAPID software. CONTRAST:  73mL ISOVUE-370 IOPAMIDOL (ISOVUE-370) INJECTION 76% COMPARISON:  Head CT without contrast 0948 hours today. Brain MRI 10/22/2016. FINDINGS: CT Brain Perfusion Findings: I observe and ASPECTS of 10 at 0949 hours today. CBF (<30%) Volume: 0 milliliters Perfusion (Tmax>6.0s) volume: 0 milliliters Mismatch Volume: Not  applicable Infarction Location:Not applicable CTA NECK Skeleton: No acute osseous abnormality identified. Degenerative changes in the cervical spine. Scattered paranasal sinus mucosal thickening and opacification. Upper chest: Negative lung apices. No superior mediastinal lymphadenopathy. Other neck: Negative.  No neck mass or lymphadenopathy identified. Aortic arch: 3 vessel arch configuration. Little to no arch and great vessel origin atherosclerosis. Right carotid system: Negative. Left carotid system: Mild tortuosity of the proximal left ICA, otherwise negative. Vertebral arteries: No proximal right subclavian artery plaque or stenosis. Normal right vertebral artery origin. The right vertebral artery is dominant and normal to the skull base. Minimal if any proximal left subclavian artery soft plaque without stenosis. Normal left vertebral artery origin. The left vertebral artery is non dominant but has only a slightly smaller caliber than typical throughout much of the neck. It is patent to the skull base without stenosis. CTA HEAD Posterior circulation: There is moderate to severe calcified plaque of the right vertebral artery V4 segment beginning a little proximal to the right PICA origin which remains patent. Subsequent distal right vertebral artery stenosis is mild or at most moderate. There is mild calcified plaque of the non dominant distal left V4 segment which remains patent to the vertebrobasilar junction. The left AICA appears dominant. Patent basilar artery with mild tortuosity. Normal SCA and PCA origins. Posterior communicating arteries are diminutive or absent. Bilateral PCA branches are patent with minimal to mild irregularity on the right. Anterior circulation: Patent ICA siphons. Mild to moderate calcified plaque on the left with minimal to mild stenosis. Normal left ophthalmic artery  origin. Lesser calcified plaque on the right except in the supraclinoid segment where there is moderate distal  right ICA stenosis (series 10, image 85). Normal right ophthalmic artery origin. Patent carotid termini. Normal MCA and ACA origins. Diminutive anterior communicating artery. Bilateral ACA branches are within normal limits. The left MCA M1, bifurcation, and left MCA branches are within normal limits. The right MCA M1, trifurcation, and right MCA branches are within normal limits. Venous sinuses: Patent. The right transverse and sigmoid sinus are dominant. Anatomic variants: Dominant right vertebral artery. The left is diminutive but remains patent to the basilar. Review of the MIP images confirms the above findings IMPRESSION: 1. Negative for large vessel occlusion, and CTP detects no core infarct or penumbra. 2. No atherosclerosis or stenosis in the neck. The Right vertebral artery is dominant. 3. Moderate intracranial atherosclerosis affecting the right vertebral artery and both ICA siphons. Associated moderate Right ICA supraclinoid segment stenosis, and mild to moderate stenosis of the dominant distal Right vertebral artery. 4. Circle-of-Willis branches are within normal limits. 5. These results were communicated to Dr. Cheral Marker at [facility] 08/18/2017 by text page via the [facility] messaging system. Electronically Signed   By: Genevie Ann M.D.   On: 08/18/2017 13:48    Assessment/Plan: Diagnosis: Small acute infarct left pons Labs and images independently reviewed.  Records reviewed and summated above. Stroke: Continue secondary stroke prophylaxis and Risk Factor Modification listed below:   Antiplatelet therapy:   Blood Pressure Management:  Continue current medication with prn's with permisive HTN per primary team Statin Agent:   Tobacco abuse:   Right sided hemiparesis Motor recovery: Fluoxetine  1. Does the need for close, 24 hr/day medical supervision in concert with the patient's rehab needs make it unreasonable for this patient to be served in a less intensive setting? Yes  2. Co-Morbidities  requiring supervision/potential complications: CAD (cont meds), ADHD (consider meds if necessary), multiple neck and head injuries with chronic neck pain with numbness tingling episodes (Biofeedback training with therapies to help reduce reliance on opiate pain medications, monitor pain control during therapies, and sedation at rest and titrate to maximum efficacy to ensure participation and gains in therapies), peripheral neuropathy (meds as necessary), diastolic dysfunction (monitor for signs/symptoms for fluid overload), Tobacco abuse (counsel), HLD (cont meds) 3. Due to safety, disease management and patient education, does the patient require 24 hr/day rehab nursing? Yes 4. Does the patient require coordinated care of a physician, rehab nurse, PT (1-2 hrs/day, 5 days/week), OT (1-2 hrs/day, 5 days/week) and SLP (1-2 hrs/day, 5 days/week) to address physical and functional deficits in the context of the above medical diagnosis(es)? Yes Addressing deficits in the following areas: balance, endurance, locomotion, strength, transferring, bathing, dressing, toileting, speech and psychosocial support 5. Can the patient actively participate in an intensive therapy program of at least 3 hrs of therapy per day at least 5 days per week? Yes 6. The potential for patient to make measurable gains while on inpatient rehab is excellent 7. Anticipated functional outcomes upon discharge from inpatient rehab are supervision and min assist  with PT, supervision and min assist with OT, independent and modified independent with SLP. 8. Estimated rehab length of stay to reach the above functional goals is: 12-17 days. 9. Anticipated D/C setting: Home 10. Anticipated post D/C treatments: HH therapy and Home excercise program 11. Overall Rehab/Functional Prognosis: good  RECOMMENDATIONS: This patient's condition is appropriate for continued rehabilitative care in the following setting: CIR after completion of medical  workup Patient has agreed to participate in recommended program. Yes Note that insurance prior authorization may be required for reimbursement for recommended care.  Comment: Rehab Admissions Coordinator to follow up.   I have personally performed a face to face diagnostic evaluation, including, but not limited to relevant history and physical exam findings, of this patient and developed relevant assessment and plan.  Additionally, I have reviewed and concur with the physician assistant's documentation above.   Delice Lesch, MD, ABPMR Bary Leriche, PA-C 08/19/2017
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
## 2017-08-19 NOTE — Progress Notes (Signed)
Inpatient Rehabilitation  Per PT, OT, and SLP request patient was screened by Gunnar Fusi for appropriateness for an Inpatient Acute Rehab consult.  At this time we are recommending an Inpatient Rehab consult.  Text paged MD to notify; please order if you are agreeable.    Carmelia Roller., CCC/SLP Admission Coordinator  [place]  Cell [phone]

## 2017-08-19 NOTE — Progress Notes (Signed)
Asked to do NIHSS for flex RN. Upon entering the room I explained to the patient that I was going to check a NIH and explained what it is. After checking the NIH I entered a score of 5 based on what I saw. Pt.'s right side remains weak with drift in both limbs. Pt. Was able to maintain limbs off of bed for the NIH. No droop or change in vision. Pt. Alert and oriented x4. Speech slurred but able to easily understand patient and he correctly answered NIH questions. Pt. Asked what his score was and I explained to him that it was 5. Pt. Said "well that's worse, it was a 6 last night." I explained that a 5 is actually slightly better.   The gentleman that was visiting the patient at the bedside then proceeded to get angry with this writer stating "he isn't better, he's worse!" I asked him to clarify that statement and when he felt the patient got worse and he stated "He's worse than last night." He then asked when the neurologist would be rounding and I stated they would be rounding today. He then stated " We spoke with administration yesterday and he's supposed to be a priority! They haven't even saw him yet today." I explained to him that they see patients in the ICU first and then come here. He then got angry with this writer again and re-hashed the story from yesterday and that they should have seen him first thing this morning. I explained to the the patient, his friend, and the patient's wife that if they feel the patient has gotten worse that I would call the Stroke team and notify them of their concerns. The pt.'s friend continued to fuss at this writer about the patient not being a priority and again re-hashed the story from yesterday.   I explained to the patient that I needed to step away to call the physician and notify him of their concerns. Called Dr. Erlinda Hong and notified him of the pt and his friend's concerns. He stated he will round but asked this writer to call Dr. Cathlean Sauer as well. Called Dr. Cathlean Sauer and  he stated he would come down and see the patient now. Notified the patient's wife and friend.

## 2017-08-19 NOTE — Progress Notes (Signed)
  Echocardiogram 2D Echocardiogram has been performed.  Riley Hartman 08/19/2017, 11:33 AM

## 2017-08-19 NOTE — Progress Notes (Signed)
STROKE TEAM PROGRESS NOTE   SUBJECTIVE (INTERVAL HISTORY) His wife and friend are at the bedside.  Overall he feels his condition is gradually worsening, but stabilized now. He stated that he was driving to work yesterday morning and felt sudden onset left eye burning sensation and down to his lips, as well as right arm numbness and heaviness. He pulled over and called EMS and sent over to ED here. Over the next 3 hours, he had more slurry speech, more numbness and weakness. Called stroke called and CT/MRI done showed left pontine infarct. He was admitted and overnight he felt his slurry speech and right sided weakness getting worse but stabilized so far.    OBJECTIVE Temp:  [97.6 F (36.4 C)-98.2 F (36.8 C)] 97.9 F (36.6 C) (05/08 1224) Pulse Rate:  [58-110] 71 (05/08 1224) Cardiac Rhythm: Normal sinus rhythm (05/08 0700) Resp:  [16-24] 20 (05/08 1224) BP: (127-168)/(85-108) 137/85 (05/08 1224) SpO2:  [79 %-100 %] 96 % (05/08 1224) Weight:  [212 lb 8.4 oz (96.4 kg)] 212 lb 8.4 oz (96.4 kg) (05/07 2013)  No results for input(s): GLUCAP in the last 168 hours. Recent Labs  Lab 08/18/17 0926  NA 140  K 4.0  CL 110  CO2 24  GLUCOSE 106*  BUN 15  CREATININE 0.94  CALCIUM 8.9   Recent Labs  Lab 08/18/17 0926  AST 18  ALT 16*  ALKPHOS 49  BILITOT 0.5  PROT 6.2*  ALBUMIN 3.8   Recent Labs  Lab 08/18/17 0926  WBC 8.7  NEUTROABS 5.6  HGB 13.5  HCT 41.1  MCV 89.5  PLT 170   Recent Labs  Lab 08/18/17 0926  TROPONINI <0.03   Recent Labs    08/18/17 0926  LABPROT 13.3  INR 1.02   Recent Labs    08/18/17 1408  COLORURINE YELLOW  LABSPEC 1.024  PHURINE 7.0  GLUCOSEU NEGATIVE  HGBUR NEGATIVE  BILIRUBINUR NEGATIVE  KETONESUR NEGATIVE  PROTEINUR NEGATIVE  NITRITE NEGATIVE  LEUKOCYTESUR NEGATIVE       Component Value Date/Time   CHOL 208 (H) 08/19/2017 0627   TRIG 349 (H) 08/19/2017 0627   HDL 29 (L) 08/19/2017 0627   CHOLHDL 7.2 08/19/2017 0627   VLDL  70 (H) 08/19/2017 0627   LDLCALC 109 (H) 08/19/2017 0627   Lab Results  Component Value Date   HGBA1C 5.6 08/19/2017      Component Value Date/Time   LABOPIA NONE DETECTED 08/18/2017 1408   COCAINSCRNUR NONE DETECTED 08/18/2017 1408   LABBENZ NONE DETECTED 08/18/2017 1408   AMPHETMU NONE DETECTED 08/18/2017 1408   THCU NONE DETECTED 08/18/2017 1408   LABBARB NONE DETECTED 08/18/2017 1408    Recent Labs  Lab 08/18/17 1400  ETH <10    I have personally reviewed the radiological images below and agree with the radiology interpretations.  Ct Angio Head W Or Wo Contrast  Result Date: 08/18/2017 CLINICAL DATA:  57 year old male with numbness, tingling, paresthesia. Slurred speech. Code stroke. EXAM: CT ANGIOGRAPHY HEAD AND NECK CT PERFUSION BRAIN TECHNIQUE: Multidetector CT imaging of the head and neck was performed using the standard protocol during bolus administration of intravenous contrast. Multiplanar CT image reconstructions and MIPs were obtained to evaluate the vascular anatomy. Carotid stenosis measurements (when applicable) are obtained utilizing NASCET criteria, using the distal internal carotid diameter as the denominator. Multiphase CT imaging of the brain was performed following IV bolus contrast injection. Subsequent parametric perfusion maps were calculated using RAPID software. CONTRAST:  46mL ISOVUE-370 IOPAMIDOL (  ISOVUE-370) INJECTION 76% COMPARISON:  Head CT without contrast 0948 hours today. Brain MRI 10/22/2016. FINDINGS: CT Brain Perfusion Findings: I observe and ASPECTS of 10 at 0949 hours today. CBF (<30%) Volume: 0 milliliters Perfusion (Tmax>6.0s) volume: 0 milliliters Mismatch Volume: Not applicable Infarction Location:Not applicable CTA NECK Skeleton: No acute osseous abnormality identified. Degenerative changes in the cervical spine. Scattered paranasal sinus mucosal thickening and opacification. Upper chest: Negative lung apices. No superior mediastinal  lymphadenopathy. Other neck: Negative.  No neck mass or lymphadenopathy identified. Aortic arch: 3 vessel arch configuration. Little to no arch and great vessel origin atherosclerosis. Right carotid system: Negative. Left carotid system: Mild tortuosity of the proximal left ICA, otherwise negative. Vertebral arteries: No proximal right subclavian artery plaque or stenosis. Normal right vertebral artery origin. The right vertebral artery is dominant and normal to the skull base. Minimal if any proximal left subclavian artery soft plaque without stenosis. Normal left vertebral artery origin. The left vertebral artery is non dominant but has only a slightly smaller caliber than typical throughout much of the neck. It is patent to the skull base without stenosis. CTA HEAD Posterior circulation: There is moderate to severe calcified plaque of the right vertebral artery V4 segment beginning a little proximal to the right PICA origin which remains patent. Subsequent distal right vertebral artery stenosis is mild or at most moderate. There is mild calcified plaque of the non dominant distal left V4 segment which remains patent to the vertebrobasilar junction. The left AICA appears dominant. Patent basilar artery with mild tortuosity. Normal SCA and PCA origins. Posterior communicating arteries are diminutive or absent. Bilateral PCA branches are patent with minimal to mild irregularity on the right. Anterior circulation: Patent ICA siphons. Mild to moderate calcified plaque on the left with minimal to mild stenosis. Normal left ophthalmic artery origin. Lesser calcified plaque on the right except in the supraclinoid segment where there is moderate distal right ICA stenosis (series 10, image 85). Normal right ophthalmic artery origin. Patent carotid termini. Normal MCA and ACA origins. Diminutive anterior communicating artery. Bilateral ACA branches are within normal limits. The left MCA M1, bifurcation, and left MCA  branches are within normal limits. The right MCA M1, trifurcation, and right MCA branches are within normal limits. Venous sinuses: Patent. The right transverse and sigmoid sinus are dominant. Anatomic variants: Dominant right vertebral artery. The left is diminutive but remains patent to the basilar. Review of the MIP images confirms the above findings IMPRESSION: 1. Negative for large vessel occlusion, and CTP detects no core infarct or penumbra. 2. No atherosclerosis or stenosis in the neck. The Right vertebral artery is dominant. 3. Moderate intracranial atherosclerosis affecting the right vertebral artery and both ICA siphons. Associated moderate Right ICA supraclinoid segment stenosis, and mild to moderate stenosis of the dominant distal Right vertebral artery. 4. Circle-of-Willis branches are within normal limits. 5. These results were communicated to Dr. Cheral Marker at [place] 08/18/2017 by text page via the [facility] messaging system. Electronically Signed   By: Genevie Ann M.D.   On: 08/18/2017 13:48   Ct Head Wo Contrast  Result Date: 08/18/2017 CLINICAL DATA:  Altered level of consciousness EXAM: CT HEAD WITHOUT CONTRAST TECHNIQUE: Contiguous axial images were obtained from the base of the skull through the vertex without intravenous contrast. COMPARISON:  10/22/2016 FINDINGS: Brain: No evidence of acute infarction, hemorrhage, hydrocephalus, extra-axial collection or mass lesion/mass effect. Vascular: No hyperdense vessel or unexpected calcification. Skull: Normal. Negative for fracture or focal lesion. Sinuses/Orbits: Mild  mucosal thickening is noted throughout the ethmoid sinuses. Other: None. IMPRESSION: No acute intracranial abnormality is noted. Diffuse mucosal changes within the ethmoid sinuses are seen. Electronically Signed   By: Inez Catalina M.D.   On: 08/18/2017 10:28   Ct Angio Neck W Or Wo Contrast  Result Date: 08/18/2017 CLINICAL DATA:  57 year old male with numbness, tingling, paresthesia.  Slurred speech. Code stroke. EXAM: CT ANGIOGRAPHY HEAD AND NECK CT PERFUSION BRAIN TECHNIQUE: Multidetector CT imaging of the head and neck was performed using the standard protocol during bolus administration of intravenous contrast. Multiplanar CT image reconstructions and MIPs were obtained to evaluate the vascular anatomy. Carotid stenosis measurements (when applicable) are obtained utilizing NASCET criteria, using the distal internal carotid diameter as the denominator. Multiphase CT imaging of the brain was performed following IV bolus contrast injection. Subsequent parametric perfusion maps were calculated using RAPID software. CONTRAST:  22mL ISOVUE-370 IOPAMIDOL (ISOVUE-370) INJECTION 76% COMPARISON:  Head CT without contrast 0948 hours today. Brain MRI 10/22/2016. FINDINGS: CT Brain Perfusion Findings: I observe and ASPECTS of 10 at 0949 hours today. CBF (<30%) Volume: 0 milliliters Perfusion (Tmax>6.0s) volume: 0 milliliters Mismatch Volume: Not applicable Infarction Location:Not applicable CTA NECK Skeleton: No acute osseous abnormality identified. Degenerative changes in the cervical spine. Scattered paranasal sinus mucosal thickening and opacification. Upper chest: Negative lung apices. No superior mediastinal lymphadenopathy. Other neck: Negative.  No neck mass or lymphadenopathy identified. Aortic arch: 3 vessel arch configuration. Little to no arch and great vessel origin atherosclerosis. Right carotid system: Negative. Left carotid system: Mild tortuosity of the proximal left ICA, otherwise negative. Vertebral arteries: No proximal right subclavian artery plaque or stenosis. Normal right vertebral artery origin. The right vertebral artery is dominant and normal to the skull base. Minimal if any proximal left subclavian artery soft plaque without stenosis. Normal left vertebral artery origin. The left vertebral artery is non dominant but has only a slightly smaller caliber than typical throughout  much of the neck. It is patent to the skull base without stenosis. CTA HEAD Posterior circulation: There is moderate to severe calcified plaque of the right vertebral artery V4 segment beginning a little proximal to the right PICA origin which remains patent. Subsequent distal right vertebral artery stenosis is mild or at most moderate. There is mild calcified plaque of the non dominant distal left V4 segment which remains patent to the vertebrobasilar junction. The left AICA appears dominant. Patent basilar artery with mild tortuosity. Normal SCA and PCA origins. Posterior communicating arteries are diminutive or absent. Bilateral PCA branches are patent with minimal to mild irregularity on the right. Anterior circulation: Patent ICA siphons. Mild to moderate calcified plaque on the left with minimal to mild stenosis. Normal left ophthalmic artery origin. Lesser calcified plaque on the right except in the supraclinoid segment where there is moderate distal right ICA stenosis (series 10, image 85). Normal right ophthalmic artery origin. Patent carotid termini. Normal MCA and ACA origins. Diminutive anterior communicating artery. Bilateral ACA branches are within normal limits. The left MCA M1, bifurcation, and left MCA branches are within normal limits. The right MCA M1, trifurcation, and right MCA branches are within normal limits. Venous sinuses: Patent. The right transverse and sigmoid sinus are dominant. Anatomic variants: Dominant right vertebral artery. The left is diminutive but remains patent to the basilar. Review of the MIP images confirms the above findings IMPRESSION: 1. Negative for large vessel occlusion, and CTP detects no core infarct or penumbra. 2. No atherosclerosis or stenosis in the  neck. The Right vertebral artery is dominant. 3. Moderate intracranial atherosclerosis affecting the right vertebral artery and both ICA siphons. Associated moderate Right ICA supraclinoid segment stenosis, and mild  to moderate stenosis of the dominant distal Right vertebral artery. 4. Circle-of-Willis branches are within normal limits. 5. These results were communicated to Dr. Cheral Marker at [place] 08/18/2017 by text page via the [facility] messaging system. Electronically Signed   By: Genevie Ann M.D.   On: 08/18/2017 13:48   Mr Angiogram Head Wo Contrast  Result Date: 08/18/2017 CLINICAL DATA:  Headache and altered mental status EXAM: MRI HEAD WITHOUT CONTRAST MRA HEAD WITHOUT CONTRAST TECHNIQUE: Multiplanar, multiecho pulse sequences of the brain and surrounding structures were obtained without intravenous contrast. Angiographic images of the head were obtained using MRA technique without contrast. COMPARISON:  Head CT 08/18/2017 Brain MRI 10/22/2016 FINDINGS: MRI HEAD FINDINGS Brain: The midline structures are normal. Abnormal diffusion restriction within the left pons, occupying an area measuring approximately 2.0 x 0.8 cm. No other diffusion abnormality. The brain parenchymal signal is normal. No acute hemorrhage or mass effect. Bilateral basal ganglia mineralization. No chronic microhemorrhage or cerebral amyloid angiopathy. No hydrocephalus, age advanced atrophy or lobar predominant volume loss. No dural abnormality or extra-axial collection. Skull and upper cervical spine: The visualized skull base, calvarium, upper cervical spine and extracranial soft tissues are normal. Sinuses/Orbits: Moderate paranasal sinus mucosal thickening. Normal orbits. MRA HEAD FINDINGS Intracranial internal carotid arteries: Normal. Anterior cerebral arteries: Normal. Middle cerebral arteries: Normal. Posterior communicating arteries: Absent Posterior cerebral arteries: Normal. Basilar artery: Normal. Vertebral arteries: Right dominant.  Normal. Superior cerebellar arteries: Normal. Anterior inferior cerebellar arteries: Normal. Posterior inferior cerebellar arteries: Normal. IMPRESSION: 1. Small acute infarct of the left pons without hemorrhage or  mass effect. 2. Normal intracranial MRA. Electronically Signed   By: Ulyses Jarred M.D.   On: 08/18/2017 15:42   Mr Brain Wo Contrast  Result Date: 08/18/2017 CLINICAL DATA:  Headache and altered mental status EXAM: MRI HEAD WITHOUT CONTRAST MRA HEAD WITHOUT CONTRAST TECHNIQUE: Multiplanar, multiecho pulse sequences of the brain and surrounding structures were obtained without intravenous contrast. Angiographic images of the head were obtained using MRA technique without contrast. COMPARISON:  Head CT 08/18/2017 Brain MRI 10/22/2016 FINDINGS: MRI HEAD FINDINGS Brain: The midline structures are normal. Abnormal diffusion restriction within the left pons, occupying an area measuring approximately 2.0 x 0.8 cm. No other diffusion abnormality. The brain parenchymal signal is normal. No acute hemorrhage or mass effect. Bilateral basal ganglia mineralization. No chronic microhemorrhage or cerebral amyloid angiopathy. No hydrocephalus, age advanced atrophy or lobar predominant volume loss. No dural abnormality or extra-axial collection. Skull and upper cervical spine: The visualized skull base, calvarium, upper cervical spine and extracranial soft tissues are normal. Sinuses/Orbits: Moderate paranasal sinus mucosal thickening. Normal orbits. MRA HEAD FINDINGS Intracranial internal carotid arteries: Normal. Anterior cerebral arteries: Normal. Middle cerebral arteries: Normal. Posterior communicating arteries: Absent Posterior cerebral arteries: Normal. Basilar artery: Normal. Vertebral arteries: Right dominant.  Normal. Superior cerebellar arteries: Normal. Anterior inferior cerebellar arteries: Normal. Posterior inferior cerebellar arteries: Normal. IMPRESSION: 1. 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 2. Normal intracranial MRA. Electronically Signed   By: Ulyses Jarred M.D.   On: 08/18/2017 15:42   Ct Cerebral Perfusion W Contrast  Result Date: 08/18/2017 CLINICAL DATA:  57 year old  male with numbness, tingling, paresthesia. Slurred speech. Code stroke. EXAM: CT ANGIOGRAPHY HEAD AND NECK CT PERFUSION BRAIN TECHNIQUE: Multidetector CT imaging of the head  and neck was performed using the standard protocol during bolus administration of intravenous contrast. Multiplanar CT image reconstructions and MIPs were obtained to evaluate the vascular anatomy. Carotid stenosis measurements (when applicable) are obtained utilizing NASCET criteria, using the distal internal carotid diameter as the denominator. Multiphase CT imaging of the brain was performed following IV bolus contrast injection. Subsequent parametric perfusion maps were calculated using RAPID software. CONTRAST:  84mL ISOVUE-370 IOPAMIDOL (ISOVUE-370) INJECTION 76% COMPARISON:  Head CT without contrast 0948 hours today. Brain MRI 10/22/2016. FINDINGS: CT Brain Perfusion Findings: I observe and ASPECTS of 10 at 0949 hours today. CBF (<30%) Volume: 0 milliliters Perfusion (Tmax>6.0s) volume: 0 milliliters Mismatch Volume: Not applicable Infarction Location:Not applicable CTA NECK Skeleton: No acute osseous abnormality identified. Degenerative changes in the cervical spine. Scattered paranasal sinus mucosal thickening and opacification. Upper chest: Negative lung apices. No superior mediastinal lymphadenopathy. Other neck: Negative.  No neck mass or lymphadenopathy identified. Aortic arch: 3 vessel arch configuration. Little to no arch and great vessel origin atherosclerosis. Right carotid system: Negative. Left carotid system: Mild tortuosity of the proximal left ICA, otherwise negative. Vertebral arteries: No proximal right subclavian artery plaque or stenosis. Normal right vertebral artery origin. The right vertebral artery is dominant and normal to the skull base. Minimal if any proximal left subclavian artery soft plaque without stenosis. Normal left vertebral artery origin. The left vertebral artery is non dominant but has only a slightly  smaller caliber than typical throughout much of the neck. It is patent to the skull base without stenosis. CTA HEAD Posterior circulation: There is moderate to severe calcified plaque of the right vertebral artery V4 segment beginning a little proximal to the right PICA origin which remains patent. Subsequent distal right vertebral artery stenosis is mild or at most moderate. There is mild calcified plaque of the non dominant distal left V4 segment which remains patent to the vertebrobasilar junction. The left AICA appears dominant. Patent basilar artery with mild tortuosity. Normal SCA and PCA origins. Posterior communicating arteries are diminutive or absent. Bilateral PCA branches are patent with minimal to mild irregularity on the right. Anterior circulation: Patent ICA siphons. Mild to moderate calcified plaque on the left with minimal to mild stenosis. Normal left ophthalmic artery origin. Lesser calcified plaque on the right except in the supraclinoid segment where there is moderate distal right ICA stenosis (series 10, image 85). Normal right ophthalmic artery origin. Patent carotid termini. Normal MCA and ACA origins. Diminutive anterior communicating artery. Bilateral ACA branches are within normal limits. The left MCA M1, bifurcation, and left MCA branches are within normal limits. The right MCA M1, trifurcation, and right MCA branches are within normal limits. Venous sinuses: Patent. The right transverse and sigmoid sinus are dominant. Anatomic variants: Dominant right vertebral artery. The left is diminutive but remains patent to the basilar. Review of the MIP images confirms the above findings IMPRESSION: 1. Negative for large vessel occlusion, and CTP detects no core infarct or penumbra. 2. No atherosclerosis or stenosis in the neck. The Right vertebral artery is dominant. 3. Moderate intracranial atherosclerosis affecting the right vertebral artery and both ICA siphons. Associated moderate Right ICA  supraclinoid segment stenosis, and mild to moderate stenosis of the dominant distal Right vertebral artery. 4. Circle-of-Willis branches are within normal limits. 5. These results were communicated to Dr. Cheral Marker at [place] 08/18/2017 by text page via the [facility] messaging system. Electronically Signed   By: Genevie Ann M.D.   On: 08/18/2017 13:48  TTE  - Left ventricle: The cavity size was normal. Wall thickness was   normal. Systolic function was vigorous. The estimated ejection   fraction was in the range of 65% to 70%. Wall motion was normal;   there were no regional wall motion abnormalities. Doppler   parameters are consistent with abnormal left ventricular   relaxation (grade 1 diastolic dysfunction). The E/e&' ratio is <8,   suggesting normal LV filling pressure. - Mitral valve: Calcified annulus. Mildly thickened leaflets .   There was trivial regurgitation. - Left atrium: The atrium was normal in size. - Right atrium: The atrium was mildly dilated. - Tricuspid valve: There was trivial regurgitation. - Pulmonary arteries: PA peak pressure: 10 mm Hg (S). - Inferior vena cava: The vessel was normal in size. The   respirophasic diameter changes were in the normal range (>= 50%),   consistent with normal central venous pressure. Impressions: - Compared to a prior study in 2018, the LVEF is higher at 65-70%   and grade 1 DD with normal LV filling pressure is noted.   PHYSICAL EXAM  Temp:  [97.6 F (36.4 C)-98.2 F (36.8 C)] 97.9 F (36.6 C) (05/08 1224) Pulse Rate:  [58-110] 71 (05/08 1224) Resp:  [16-24] 20 (05/08 1224) BP: (127-168)/(85-108) 137/85 (05/08 1224) SpO2:  [79 %-100 %] 96 % (05/08 1224) Weight:  [212 lb 8.4 oz (96.4 kg)] 212 lb 8.4 oz (96.4 kg) (05/07 2013)  General - Well nourished, well developed, in no apparent distress.  Ophthalmologic - fundi not visualized due to noncooperation.  Cardiovascular - Regular rate and rhythm.  Mental Status -  Level of  arousal and orientation to time, place, and person were intact. Language including expression, naming, repetition, comprehension was assessed and found intact, mild dysarthria Fund of Knowledge was assessed and was intact.  Cranial Nerves II - XII - II - Visual field intact OU. III, IV, VI - Extraocular movements intact. V - right facial decreased sensation, 50% of the left. VII - right facial droop. VIII - Hearing & vestibular intact bilaterally. X - Palate elevates symmetrically, mild dysarthria. XI - Chin turning & shoulder shrug intact bilaterally. XII - Tongue protrusion intact.  Motor Strength - The patient's strength was normal LUE and LLE, but RUE and RLE proximal 4/5 and distal 3/5 with decreased hand dexterity and pronator drift was present on the right.  Bulk was normal and fasciculations were absent.   Motor Tone - Muscle tone was assessed at the neck and appendages and was normal.  Reflexes - The patient's reflexes were symmetrical in all extremities and he had no pathological reflexes.  Sensory - Light touch, temperature/pinprick were assessed and were symmetrical.    Coordination - The patient had normal movements in the left hands, but dysmetria on HTS at left LE, and significant ataxia RUE on FTN and RLE on HTS, but seems proportional to weakness.  Tremor was absent.  Gait and Station - deferred.   ASSESSMENT/PLAN Riley Hartman is a 57 y.o. male with history of CAD, HTN, HLD, smoker, hx of MCA and head injuries admitted for right sided numbness and weakness. No tPA given due to OSW.    Stroke:  left pontine infarct, likely secondary to small vessel disease source  Resultant right hemiparesis, right facial droop and dysarthria  MRI  Left pontine infarct  CTA head and neck b/l siphon, and dominant right V4 stenosis  2D Echo  EF 65-70%  LDL 109  HgbA1c 5.6  lovenox for VTE prophylaxis  No antithrombotic prior to admission, now on aspirin 325 mg daily.  Recommend ASA 81 and plavix for 3 months and then either ASA or plavix alone.  Patient counseled to be compliant with his antithrombotic medications  Ongoing aggressive stroke risk factor management  Therapy recommendations:  CIR  Disposition:  pending  Hypertension Stable Home med - lisinopril Permissive hypertension (OK if <220/120) for 24-48 hours post stroke and then gradually normalized within 5-7 days.  Long term BP goal normotensive  Hyperlipidemia  Home meds:  none   LDL 109, goal < 70  Now on lipitor 80  Continue statin at discharge  Tobacco abuse  Current smoker  Smoking cessation counseling provided  Pt is willing to quit  Other Stroke Risk Factors  Coronary artery disease  Obstructive sleep apnea  Other Active Problems  High TG   Elevated PSA - as per primary team  Hospital day # 0  Neurology will sign off. Please call with questions. Pt will follow up with stroke clinic NP at [facility] in about 4 weeks. Thanks for the consult.   Rosalin Hawking, MD PhD Stroke Neurology 08/19/2017 3:23 PM    To contact Stroke Continuity provider, please refer to [email or web address] After hours, contact General Neurology

## 2017-08-20 ENCOUNTER — Encounter (HOSPITAL_COMMUNITY): Payer: Self-pay | Admitting: *Deleted

## 2017-08-20 ENCOUNTER — Inpatient Hospital Stay (HOSPITAL_COMMUNITY)
Admission: RE | Admit: 2017-08-20 | Discharge: 2017-08-29 | DRG: 057 | Disposition: A | Discharge status: Home / Self Care | Payer: BLUE CROSS/BLUE SHIELD | Source: Intra-hospital | Attending: Physical Medicine & Rehabilitation | Admitting: Physical Medicine & Rehabilitation

## 2017-08-20 DIAGNOSIS — G4730 Sleep apnea, unspecified: Secondary | ICD-10-CM | POA: Diagnosis present

## 2017-08-20 DIAGNOSIS — R269 Unspecified abnormalities of gait and mobility: Secondary | ICD-10-CM | POA: Diagnosis not present

## 2017-08-20 DIAGNOSIS — N400 Enlarged prostate without lower urinary tract symptoms: Secondary | ICD-10-CM | POA: Diagnosis present

## 2017-08-20 DIAGNOSIS — Z8249 Family history of ischemic heart disease and other diseases of the circulatory system: Secondary | ICD-10-CM | POA: Diagnosis not present

## 2017-08-20 DIAGNOSIS — R2689 Other abnormalities of gait and mobility: Secondary | ICD-10-CM | POA: Diagnosis present

## 2017-08-20 DIAGNOSIS — I69392 Facial weakness following cerebral infarction: Secondary | ICD-10-CM

## 2017-08-20 DIAGNOSIS — F909 Attention-deficit hyperactivity disorder, unspecified type: Secondary | ICD-10-CM | POA: Diagnosis present

## 2017-08-20 DIAGNOSIS — I672 Cerebral atherosclerosis: Secondary | ICD-10-CM | POA: Diagnosis not present

## 2017-08-20 DIAGNOSIS — Z87891 Personal history of nicotine dependence: Secondary | ICD-10-CM | POA: Diagnosis not present

## 2017-08-20 DIAGNOSIS — I69351 Hemiplegia and hemiparesis following cerebral infarction affecting right dominant side: Principal | ICD-10-CM

## 2017-08-20 DIAGNOSIS — I4510 Unspecified right bundle-branch block: Secondary | ICD-10-CM | POA: Diagnosis present

## 2017-08-20 DIAGNOSIS — R278 Other lack of coordination: Secondary | ICD-10-CM | POA: Diagnosis present

## 2017-08-20 DIAGNOSIS — I69398 Other sequelae of cerebral infarction: Secondary | ICD-10-CM

## 2017-08-20 DIAGNOSIS — Z833 Family history of diabetes mellitus: Secondary | ICD-10-CM

## 2017-08-20 DIAGNOSIS — Z79899 Other long term (current) drug therapy: Secondary | ICD-10-CM | POA: Diagnosis not present

## 2017-08-20 DIAGNOSIS — I6330 Cerebral infarction due to thrombosis of unspecified cerebral artery: Secondary | ICD-10-CM | POA: Diagnosis present

## 2017-08-20 DIAGNOSIS — F411 Generalized anxiety disorder: Secondary | ICD-10-CM | POA: Diagnosis present

## 2017-08-20 DIAGNOSIS — M542 Cervicalgia: Secondary | ICD-10-CM | POA: Diagnosis present

## 2017-08-20 DIAGNOSIS — R262 Difficulty in walking, not elsewhere classified: Secondary | ICD-10-CM | POA: Diagnosis not present

## 2017-08-20 DIAGNOSIS — I69322 Dysarthria following cerebral infarction: Secondary | ICD-10-CM | POA: Diagnosis not present

## 2017-08-20 DIAGNOSIS — E785 Hyperlipidemia, unspecified: Secondary | ICD-10-CM | POA: Diagnosis not present

## 2017-08-20 DIAGNOSIS — I10 Essential (primary) hypertension: Secondary | ICD-10-CM | POA: Diagnosis not present

## 2017-08-20 DIAGNOSIS — G8191 Hemiplegia, unspecified affecting right dominant side: Secondary | ICD-10-CM | POA: Diagnosis not present

## 2017-08-20 DIAGNOSIS — Z87442 Personal history of urinary calculi: Secondary | ICD-10-CM | POA: Diagnosis not present

## 2017-08-20 DIAGNOSIS — G8929 Other chronic pain: Secondary | ICD-10-CM | POA: Diagnosis present

## 2017-08-20 DIAGNOSIS — Z888 Allergy status to other drugs, medicaments and biological substances status: Secondary | ICD-10-CM | POA: Diagnosis not present

## 2017-08-20 DIAGNOSIS — G629 Polyneuropathy, unspecified: Secondary | ICD-10-CM | POA: Diagnosis present

## 2017-08-20 DIAGNOSIS — F419 Anxiety disorder, unspecified: Secondary | ICD-10-CM | POA: Diagnosis present

## 2017-08-20 DIAGNOSIS — I2510 Atherosclerotic heart disease of native coronary artery without angina pectoris: Secondary | ICD-10-CM | POA: Diagnosis present

## 2017-08-20 DIAGNOSIS — I639 Cerebral infarction, unspecified: Secondary | ICD-10-CM

## 2017-08-20 DIAGNOSIS — M25519 Pain in unspecified shoulder: Secondary | ICD-10-CM | POA: Diagnosis present

## 2017-08-20 LAB — BASIC METABOLIC PANEL
Anion gap: 8 (ref 5–15)
BUN: 17 mg/dL (ref 6–20)
CO2: 28 mmol/L (ref 22–32)
Calcium: 8.8 mg/dL — ABNORMAL LOW (ref 8.9–10.3)
Chloride: 108 mmol/L (ref 101–111)
Creatinine, Ser: 0.81 mg/dL (ref 0.61–1.24)
GFR calc Af Amer: >60 mL/min (ref >60)
GFR calc non Af Amer: >60 mL/min (ref >60)
Glucose, Bld: 102 mg/dL — ABNORMAL HIGH (ref 65–99)
Potassium: 4.2 mmol/L (ref 3.5–5.1)
Sodium: 144 mmol/L (ref 135–145)

## 2017-08-20 MED ORDER — ACETAMINOPHEN 325 MG PO TABS: 325.0000 mg | ORAL_TABLET | ORAL | Status: DC | PRN

## 2017-08-20 MED ORDER — ATORVASTATIN CALCIUM 80 MG PO TABS
80.0000 mg | ORAL_TABLET | Freq: Every day | ORAL | Status: DC
  Administered 2017-08-20 – 2017-08-28 (×9): 80 mg via ORAL
  Filled 2017-08-20 (×10): qty 1

## 2017-08-20 MED ORDER — DICLOFENAC SODIUM 1 % TD GEL
2.0000 g | Freq: Four times a day (QID) | TRANSDERMAL | Status: DC
  Filled 2017-08-20 (×2): qty 100

## 2017-08-20 MED ORDER — CLOPIDOGREL BISULFATE 75 MG PO TABS
75.0000 mg | ORAL_TABLET | Freq: Every day | ORAL | Status: DC
  Administered 2017-08-21 – 2017-08-29 (×9): 75 mg via ORAL
  Filled 2017-08-20 (×9): qty 1

## 2017-08-20 MED ORDER — TRAZODONE HCL 50 MG PO TABS: 25.0000 mg | ORAL_TABLET | Freq: Every evening | ORAL | Status: DC | PRN

## 2017-08-20 MED ORDER — TAMSULOSIN HCL 0.4 MG PO CAPS
0.4000 mg | ORAL_CAPSULE | Freq: Every day | ORAL | Status: DC
  Administered 2017-08-20 – 2017-08-28 (×9): 0.4 mg via ORAL
  Filled 2017-08-20 (×10): qty 1

## 2017-08-20 MED ORDER — ATORVASTATIN CALCIUM 80 MG PO TABS: 80.0000 mg | ORAL_TABLET | Freq: Every day | ORAL | 0 refills | Status: DC

## 2017-08-20 MED ORDER — ALUM & MAG HYDROXIDE-SIMETH 200-200-20 MG/5ML PO SUSP: 30.0000 mL | ORAL | Status: DC | PRN

## 2017-08-20 MED ORDER — FLEET ENEMA 7-19 GM/118ML RE ENEM: 1.0000 | ENEMA | Freq: Once | RECTAL | Status: DC | PRN

## 2017-08-20 MED ORDER — CLOPIDOGREL BISULFATE 75 MG PO TABS: 75.0000 mg | ORAL_TABLET | Freq: Every day | ORAL | 0 refills | Status: DC

## 2017-08-20 MED ORDER — DICLOFENAC SODIUM 1 % TD GEL
2.0000 g | Freq: Four times a day (QID) | TRANSDERMAL | Status: DC
  Administered 2017-08-20 – 2017-08-24 (×4): 2 g via TOPICAL

## 2017-08-20 MED ORDER — ASPIRIN EC 81 MG PO TBEC
81.0000 mg | DELAYED_RELEASE_TABLET | Freq: Every day | ORAL | Status: DC
  Administered 2017-08-21 – 2017-08-29 (×9): 81 mg via ORAL
  Filled 2017-08-20 (×9): qty 1

## 2017-08-20 MED ORDER — BISACODYL 10 MG RE SUPP: 10.0000 mg | Freq: Every day | RECTAL | Status: DC | PRN

## 2017-08-20 MED ORDER — PROCHLORPERAZINE MALEATE 5 MG PO TABS: 5.0000 mg | ORAL_TABLET | Freq: Four times a day (QID) | ORAL | Status: DC | PRN

## 2017-08-20 MED ORDER — DIPHENHYDRAMINE HCL 12.5 MG/5ML PO ELIX: 12.5000 mg | ORAL_SOLUTION | Freq: Four times a day (QID) | ORAL | Status: DC | PRN

## 2017-08-20 MED ORDER — PROCHLORPERAZINE 25 MG RE SUPP: 12.5000 mg | Freq: Four times a day (QID) | RECTAL | Status: DC | PRN

## 2017-08-20 MED ORDER — GUAIFENESIN-DM 100-10 MG/5ML PO SYRP: 5.0000 mL | ORAL_SOLUTION | Freq: Four times a day (QID) | ORAL | Status: DC | PRN

## 2017-08-20 MED ORDER — POLYETHYLENE GLYCOL 3350 17 G PO PACK: 17.0000 g | PACK | Freq: Every day | ORAL | Status: DC | PRN

## 2017-08-20 MED ORDER — ASPIRIN 81 MG PO TBEC: 81.0000 mg | DELAYED_RELEASE_TABLET | Freq: Every day | ORAL | 0 refills | Status: AC

## 2017-08-20 MED ORDER — CLONAZEPAM 0.5 MG PO TABS: 1.0000 mg | ORAL_TABLET | Freq: Two times a day (BID) | ORAL | Status: DC | PRN

## 2017-08-20 MED ORDER — ENOXAPARIN SODIUM 40 MG/0.4ML ~~LOC~~ SOLN
40.0000 mg | SUBCUTANEOUS | Status: DC
  Administered 2017-08-21 – 2017-08-28 (×8): 40 mg via SUBCUTANEOUS
  Filled 2017-08-20 (×8): qty 0.4

## 2017-08-20 MED ORDER — CLONIDINE HCL 0.1 MG PO TABS: 0.1000 mg | ORAL_TABLET | Freq: Three times a day (TID) | ORAL | Status: DC | PRN

## 2017-08-20 MED ORDER — DICLOFENAC SODIUM 1 % TD GEL: 2.0000 g | Freq: Four times a day (QID) | TRANSDERMAL | 0 refills | Status: DC

## 2017-08-20 MED ORDER — PROCHLORPERAZINE EDISYLATE 10 MG/2ML IJ SOLN: 5.0000 mg | Freq: Four times a day (QID) | INTRAMUSCULAR | Status: DC | PRN

## 2017-08-20 MED ORDER — FINASTERIDE 5 MG PO TABS
5.0000 mg | ORAL_TABLET | Freq: Every day | ORAL | Status: DC
  Administered 2017-08-21 – 2017-08-29 (×9): 5 mg via ORAL
  Filled 2017-08-20 (×10): qty 1
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
## 2017-08-20 NOTE — Progress Notes (Signed)
Physical Therapy Treatment Patient Details Name: Riley Hartman MRN: [id number] DOB: 09/12/1960 Today's Date: 08/20/2017    History of Present Illness 57 y.o. male with medical history significant for nonobstructive CAD based on cardiac catheterization 2013, dyslipidemia, HTN, ADHD, and anxiety disorder.  Patient presented to the ER this morning via EMS after developing burning in the left side of his lip and pain in the left eye without visual disturbance. Developed right-sided tingling and weakness. Initial CT of the head was unremarkable for acute stroke.  Symptoms persisted and he subsequently developed dysarthria and word finding difficulties as well as slurred speech. 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 without hemorrhage or mass.    PT Comments    Patient progressing with R side coordination activities this session and able to perform standing ADL's including brushing teeth and fastening shorts after toileting.  Still with ataxic quality with balance in unsupported standing and will benefit from CIR level rehab at d/c.   Follow Up Recommendations  CIR     Equipment Recommendations  Other (comment)(TBA)    Recommendations for Other Services       Precautions / Restrictions Precautions Precautions: Fall Restrictions Weight Bearing Restrictions: No    Mobility  Bed Mobility     Rolling: Supervision   Supine to sit: Supervision     General bed mobility comments: increased time and S for safety  Transfers Overall transfer level: Needs assistance Equipment used: Rolling walker (2 wheeled)   Sit to Stand: Min assist         General transfer comment: assist for balance, cues for hand placement  Ambulation/Gait Ambulation/Gait assistance: Min assist Ambulation Distance (Feet): 80 Feet Assistive device: Rolling walker (2 wheeled) Gait Pattern/deviations: Step-through pattern;Decreased stride length;Scissoring;Narrow base of support     General Gait  Details: cues for step width/placement of R LE, forward gaze and for walker safety, assist for balance and walker   Stairs             Wheelchair Mobility    Modified Rankin (Stroke Patients Only) Modified Rankin (Stroke Patients Only) Pre-Morbid Rankin Score: No symptoms Modified Rankin: Moderately severe disability     Balance Overall balance assessment: Needs assistance Sitting-balance support: No upper extremity supported;Feet supported Sitting balance-Leahy Scale: Good     Standing balance support: No upper extremity supported Standing balance-Leahy Scale: Fair Standing balance comment: Supervision with wide BOS while fastening shorts after toileting, while placing toothpaste on toothbrush with moderate postural sway                            Cognition Arousal/Alertness: Awake/alert Behavior During Therapy: WFL for tasks assessed/performed Overall Cognitive Status: Within Functional Limits for tasks assessed                                        Exercises Other Exercises Other Exercises: supine R hip and knee control activity with assist; single leg bridges on R, and sidelying prop on R UE to reach overhead with facilitation on R ribs Other Exercises: seated in/in/out/out with both feet x 10, marches x 10 and heel/toe raises    General Comments        Pertinent Vitals/Pain Pain Assessment: No/denies pain    Home Living  Prior Function            PT Goals (current goals can now be found in the care plan section) Progress towards PT goals: Progressing toward goals    Frequency    Min 4X/week      PT Plan Current plan remains appropriate    Co-evaluation              AM-PAC PT "6 Clicks" Daily Activity  Outcome Measure  Difficulty turning over in bed (including adjusting bedclothes, sheets and blankets)?: A Little Difficulty moving from lying on back to sitting on the side of  the bed? : A Lot Difficulty sitting down on and standing up from a chair with arms (e.g., wheelchair, bedside commode, etc,.)?: Unable Help needed moving to and from a bed to chair (including a wheelchair)?: A Little Help needed walking in hospital room?: A Little Help needed climbing 3-5 steps with a railing? : A Lot 6 Click Score: 14    End of Session Equipment Utilized During Treatment: Gait belt Activity Tolerance: Patient tolerated treatment well Patient left: with call bell/phone within reach;with chair alarm set;in chair   PT Visit Diagnosis: Other abnormalities of gait and mobility (R26.89);Other symptoms and signs involving the nervous system (R29.898);Hemiplegia and hemiparesis Hemiplegia - Right/Left: Right Hemiplegia - dominant/non-dominant: Dominant Hemiplegia - caused by: Cerebral infarction     Time: 0912-0942 PT Time Calculation (min) (ACUTE ONLY): 30 min  Charges:  $Gait Training: 8-22 mins $Neuromuscular Re-education: 8-22 mins                    G CodesMagda Kiel, [place] [phone] 08/20/2017    Reginia Naas 08/20/2017, 10:42 AM

## 2017-08-20 NOTE — Progress Notes (Signed)
  Speech Language Pathology Treatment: Cognitive-Linquistic  Patient Details Name: Riley Hartman MRN: [id number] DOB: 1960-06-11 Today's Date: 08/20/2017 Time: 7124-5809 SLP Time Calculation (min) (ACUTE ONLY): 16 min  Assessment / Plan / Recommendation Clinical Impression  Pt was seen for skilled ST targeting speech goals.  SLP provided skilled education regarding compensatory intelligibility strategies, emphasizing overarticulation, environmental modifications to increase intelligibility, and incorporating periods of speech rest throughout the day to minimize effects of fatigue.   Pt endorses marked changes in the quality of his speech since CVA as well as decreased intelligibility over the phone.  Pt needed min assist verbal cues for use of intelligibility strategies during oral reading tasks at the phrase level.  Pt was left in recliner with wife at bedside, call bell within reach.  Continue per current plan of care.    HPI HPI: Riley Spratt Pyrtleis a 57 y.o.malewith medical history significant fornonobstructive CAD based on cardiac catheterization 2013, dyslipidemia, hypertension, ADHD, anxiety disorder. Patient also has a history of ongoing tobacco abuse. Patient presented to the ER this morning via EMS after developing burning in the left side of his lip and pain in the left eye without visual disturbance. This began about 730. It was reported his systolic blood pressure was 200 in the field. Soon afterwards he developed right-sided tingling in the arm and leg.Initial CT of the head was unremarkable for acute stroke. Unfortunately patient's symptoms persisted and he subsequently developed dysarthria and word finding difficulties as well as slurred speech. Code stroke was initiated and neurology was consulted. CTA of the head neck revealed no large vessel occlusions. No indications for thrombolytic therapy. MRI/MRA pending. Patient tells me he has continued to have dysarthria and tingling  in the right arm and leg. 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 without hemorrhage or mass      SLP Plan  Continue with current plan of care       Recommendations                   Follow up Recommendations: Inpatient Rehab SLP Visit Diagnosis: Dysarthria and anarthria (R47.1) Plan: Continue with current plan of care       GO                Riley Hartman, [name] 08/20/2017, 10:25 AM

## 2017-08-20 NOTE — Progress Notes (Addendum)
Inpatient Rehabilitation  Met with patient and spouse at bedside to discuss team's recommendation for IP Rehab.  Shared booklets, insurance verification letter, and answered initial questions.  Plan to follow up with team later today with hopeful authorization.  We have a bed available to offer today and discussed with Dr. Arrien.  Call if questions.    I have acute medical clearance, received insurance approval, and have a bed to offer.  Will proceed with admission today and patient in agreement with plan.    Melissa Bowie, M.A., CCC/SLP Admission Coordinator  [facility]  Cell [phone]  
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
## 2017-08-20 NOTE — Discharge Summary (Signed)
Physician Discharge Summary  Riley Hartman EYC:[id number] DOB: 13-Dec-1960 DOA: 08/18/2017  PCP: Laurey Morale, MD  Admit date: 08/18/2017 Discharge date: 08/20/2017  Admitted From: Home  Disposition:  Inpatient rehabilitation unit  Recommendations for Outpatient Follow-up and new medication changes:  1. Follow up with PCP in 1-week 2. Patient has been placed on dual antiplatelet therapy with aspirin and clopidogrel, for 3 months and then continue either aspirin or clopidogrel alone.  3. Patient placed on high-dose atorvastatin, 80 mg daily  Home Health: No  Equipment/Devices: Na   Discharge Condition: Stable CODE STATUS: Full Diet recommendation: Heart healthy  Brief/Interim Summary: 57 year old male who presented with strokelike symptoms.  Patient does have a significant past medical history for coronary artery disease, dyslipidemia, hypertension, anxiety disorder, attention deficit disorder.  He presented with a burning sensation in the left side of his lip and pain in the left eye without visual disturbances, then it progressed into right-sided paresthesias affecting the arm and leg, along with dysarthria and aphasia.  Initial physical examination blood pressure 142/97, heart rate 68, respiratory rate 22, oxygen saturation 96%.  Moist mucous membranes, lungs were clear to auscultation bilaterally, heart S1-S2 present and rhythmic, the abdomen was soft nontender, no lower extremity edema.  He had slurred speech, dysarthria and right facial droop.  Positive decreased strength in the right upper and lower extremities (4 out of 5).  Sodium 140, potassium 4.0, chloride 110, bicarb 24, glucose 106, BUN 15, creatinine 0.94, white count 8.7, hemoglobin 13.5 hematocrit 41.1, platelets 170, urinalysis negative for infection, negative urine drug screen.  EKG sinus rhythm, right bundle branch block.   Patient was admitted to the hospital with the working diagnosis of focal neurologic deficit, to rule out  CVA  1.  Acute infarct of the left pons.  Patient was admitted to the medical ward, he was placed on a remote telemetry monitor, frequent neuro checks, speech and physical therapy evaluation.  Patient showed mild improvement of his right sided weakness, further recommendations for inpatient rehabilitation.  Further work-up with MRI showed small acute infarct of the left pons without hemorrhage or mass-effect, normal intracranial MRA, CT angiography of the head and neck showed moderate right ICA supraclinoid segment stenosis and mild to moderate stenosis of the dominant distal right vertebral artery.  Echocardiography showed normal LV systolic function, his LDL was 109, HDL 29, total cholesterol 208. Patient will continue dual antiplatelet therapy with aspirin and clopidogrel for 3 months then continue either aspirin or clopidogrel alone.  Continue blood pressure control with lisinopril, high potency statin with atorvastatin 80 mg daily.   2.  Uncontrolled hypertension.  Antihypertensive agents were held during his hospitalization to avoid hypotension.  Discharge systolic blood pressure 149 to 130 mmHg, patient will resume lisinopril 20 mg at discharge.   3.  Coronary artery disease.  Stable, no chest pain, LV systolic function normal.  Continue antiplatelet therapy and statin.  4.  Tobacco abuse.  Cessation counseling  5.  BPH.  Continue tamsulosin and finasteride, no signs of urinary retention.  6.  Anxiety.  Stable, patient was continued on clonazepam per his home regimen  Discharge Diagnoses:  Principal Problem:   Stroke-like symptoms Active Problems:   CAD (coronary artery disease)   HTN (hypertension)   HLD (hyperlipidemia)   ADHD   Tobacco abuse   Anxiety disorder   Cerebral thrombosis with cerebral infarction   Chronic pain syndrome   Peripheral neuropathy   Diastolic dysfunction    Discharge Instructions  Discharge Instructions    Ambulatory referral to Neurology   Complete  by:  As directed    Follow up with stroke clinic NP (Jessica Vanschaick or Cecille Rubin, if both not available, consider Zachery Dauer, or Ahern) at [facility] in about 4 weeks. Thanks.     Allergies as of 08/20/2017      Reactions   Gadolinium Derivatives Other (See Comments)   Slight low BP, lethargy, fatigue x 1 week      Medication List    STOP taking these medications   celecoxib 200 MG capsule Commonly known as:  CELEBREX     TAKE these medications   aspirin 81 MG EC tablet Take 1 tablet (81 mg total) by mouth daily. Start taking on:  08/21/2017   atorvastatin 80 MG tablet Commonly known as:  LIPITOR Take 1 tablet (80 mg total) by mouth daily at 6 PM.   clonazePAM 1 MG tablet Commonly known as:  KLONOPIN TAKE ONE TABLET BY MOUTH TWICE DAILY AS NEEDED What changed:    how much to take  how to take this  when to take this   clopidogrel 75 MG tablet Commonly known as:  PLAVIX Take 1 tablet (75 mg total) by mouth daily. Start taking on:  08/21/2017   diclofenac sodium 1 % Gel Commonly known as:  VOLTAREN Apply 2 g topically 4 (four) times daily. Please apply at the base of the head, upper neck region.   finasteride 5 MG tablet Commonly known as:  PROSCAR Take 5 mg by mouth daily.   lisinopril 20 MG tablet Commonly known as:  PRINIVIL,ZESTRIL Take 1 tablet (20 mg total) by mouth daily.   methylphenidate 20 MG ER tablet Commonly known as:  METADATE ER Take 1 tablet (20 mg total) by mouth every morning.   tamsulosin 0.4 MG Caps capsule Commonly known as:  FLOMAX TAKE ONE CAPSULE BY MOUTH ONCE DAILY What changed:    how much to take  how to take this  when to take this      Follow-up Information    [facility]. Schedule an appointment as soon as possible for a visit in 4 week(s).   Specialty:  Radiology Contact information: [place] [phone]         Allergies   Allergen Reactions  . Gadolinium Derivatives Other (See Comments)    Slight low BP, lethargy, fatigue x 1 week    Consultations:  Neurology    Procedures/Studies: Ct Angio Head W Or Wo Contrast  Result Date: 08/18/2017 CLINICAL DATA:  57 year old male with numbness, tingling, paresthesia. Slurred speech. Code stroke. EXAM: CT ANGIOGRAPHY HEAD AND NECK CT PERFUSION BRAIN TECHNIQUE: Multidetector CT imaging of the head and neck was performed using the standard protocol during bolus administration of intravenous contrast. Multiplanar CT image reconstructions and MIPs were obtained to evaluate the vascular anatomy. Carotid stenosis measurements (when applicable) are obtained utilizing NASCET criteria, using the distal internal carotid diameter as the denominator. Multiphase CT imaging of the brain was performed following IV bolus contrast injection. Subsequent parametric perfusion maps were calculated using RAPID software. CONTRAST:  65mL ISOVUE-370 IOPAMIDOL (ISOVUE-370) INJECTION 76% COMPARISON:  Head CT without contrast 0948 hours today. Brain MRI 10/22/2016. FINDINGS: CT Brain Perfusion Findings: I observe and ASPECTS of 10 at 0949 hours today. CBF (<30%) Volume: 0 milliliters Perfusion (Tmax>6.0s) volume: 0 milliliters Mismatch Volume: Not applicable Infarction Location:Not applicable CTA NECK Skeleton: No acute osseous abnormality  identified. Degenerative changes in the cervical spine. Scattered paranasal sinus mucosal thickening and opacification. Upper chest: Negative lung apices. No superior mediastinal lymphadenopathy. Other neck: Negative.  No neck mass or lymphadenopathy identified. Aortic arch: 3 vessel arch configuration. Little to no arch and great vessel origin atherosclerosis. Right carotid system: Negative. Left carotid system: Mild tortuosity of the proximal left ICA, otherwise negative. Vertebral arteries: No proximal right subclavian artery plaque or stenosis. Normal right vertebral  artery origin. The right vertebral artery is dominant and normal to the skull base. Minimal if any proximal left subclavian artery soft plaque without stenosis. Normal left vertebral artery origin. The left vertebral artery is non dominant but has only a slightly smaller caliber than typical throughout much of the neck. It is patent to the skull base without stenosis. CTA HEAD Posterior circulation: There is moderate to severe calcified plaque of the right vertebral artery V4 segment beginning a little proximal to the right PICA origin which remains patent. Subsequent distal right vertebral artery stenosis is mild or at most moderate. There is mild calcified plaque of the non dominant distal left V4 segment which remains patent to the vertebrobasilar junction. The left AICA appears dominant. Patent basilar artery with mild tortuosity. Normal SCA and PCA origins. Posterior communicating arteries are diminutive or absent. Bilateral PCA branches are patent with minimal to mild irregularity on the right. Anterior circulation: Patent ICA siphons. Mild to moderate calcified plaque on the left with minimal to mild stenosis. Normal left ophthalmic artery origin. Lesser calcified plaque on the right except in the supraclinoid segment where there is moderate distal right ICA stenosis (series 10, image 85). Normal right ophthalmic artery origin. Patent carotid termini. Normal MCA and ACA origins. Diminutive anterior communicating artery. Bilateral ACA branches are within normal limits. The left MCA M1, bifurcation, and left MCA branches are within normal limits. The right MCA M1, trifurcation, and right MCA branches are within normal limits. Venous sinuses: Patent. The right transverse and sigmoid sinus are dominant. Anatomic variants: Dominant right vertebral artery. The left is diminutive but remains patent to the basilar. Review of the MIP images confirms the above findings IMPRESSION: 1. Negative for large vessel  occlusion, and CTP detects no core infarct or penumbra. 2. No atherosclerosis or stenosis in the neck. The Right vertebral artery is dominant. 3. Moderate intracranial atherosclerosis affecting the right vertebral artery and both ICA siphons. Associated moderate Right ICA supraclinoid segment stenosis, and mild to moderate stenosis of the dominant distal Right vertebral artery. 4. Circle-of-Willis branches are within normal limits. 5. These results were communicated to Dr. Cheral Marker at [place] 08/18/2017 by text page via the [facility] messaging system. Electronically Signed   By: Genevie Ann M.D.   On: 08/18/2017 13:48   Ct Head Wo Contrast  Result Date: 08/18/2017 CLINICAL DATA:  Altered level of consciousness EXAM: CT HEAD WITHOUT CONTRAST TECHNIQUE: Contiguous axial images were obtained from the base of the skull through the vertex without intravenous contrast. COMPARISON:  10/22/2016 FINDINGS: Brain: No evidence of acute infarction, hemorrhage, hydrocephalus, extra-axial collection or mass lesion/mass effect. Vascular: No hyperdense vessel or unexpected calcification. Skull: Normal. Negative for fracture or focal lesion. Sinuses/Orbits: Mild mucosal thickening is noted throughout the ethmoid sinuses. Other: None. IMPRESSION: No acute intracranial abnormality is noted. Diffuse mucosal changes within the ethmoid sinuses are seen. Electronically Signed   By: Inez Catalina M.D.   On: 08/18/2017 10:28   Ct Angio Neck W Or Wo Contrast  Result Date: 08/18/2017 CLINICAL  DATA:  57 year old male with numbness, tingling, paresthesia. Slurred speech. Code stroke. EXAM: CT ANGIOGRAPHY HEAD AND NECK CT PERFUSION BRAIN TECHNIQUE: Multidetector CT imaging of the head and neck was performed using the standard protocol during bolus administration of intravenous contrast. Multiplanar CT image reconstructions and MIPs were obtained to evaluate the vascular anatomy. Carotid stenosis measurements (when applicable) are obtained utilizing  NASCET criteria, using the distal internal carotid diameter as the denominator. Multiphase CT imaging of the brain was performed following IV bolus contrast injection. Subsequent parametric perfusion maps were calculated using RAPID software. CONTRAST:  38mL ISOVUE-370 IOPAMIDOL (ISOVUE-370) INJECTION 76% COMPARISON:  Head CT without contrast 0948 hours today. Brain MRI 10/22/2016. FINDINGS: CT Brain Perfusion Findings: I observe and ASPECTS of 10 at 0949 hours today. CBF (<30%) Volume: 0 milliliters Perfusion (Tmax>6.0s) volume: 0 milliliters Mismatch Volume: Not applicable Infarction Location:Not applicable CTA NECK Skeleton: No acute osseous abnormality identified. Degenerative changes in the cervical spine. Scattered paranasal sinus mucosal thickening and opacification. Upper chest: Negative lung apices. No superior mediastinal lymphadenopathy. Other neck: Negative.  No neck mass or lymphadenopathy identified. Aortic arch: 3 vessel arch configuration. Little to no arch and great vessel origin atherosclerosis. Right carotid system: Negative. Left carotid system: Mild tortuosity of the proximal left ICA, otherwise negative. Vertebral arteries: No proximal right subclavian artery plaque or stenosis. Normal right vertebral artery origin. The right vertebral artery is dominant and normal to the skull base. Minimal if any proximal left subclavian artery soft plaque without stenosis. Normal left vertebral artery origin. The left vertebral artery is non dominant but has only a slightly smaller caliber than typical throughout much of the neck. It is patent to the skull base without stenosis. CTA HEAD Posterior circulation: There is moderate to severe calcified plaque of the right vertebral artery V4 segment beginning a little proximal to the right PICA origin which remains patent. Subsequent distal right vertebral artery stenosis is mild or at most moderate. There is mild calcified plaque of the non dominant distal left  V4 segment which remains patent to the vertebrobasilar junction. The left AICA appears dominant. Patent basilar artery with mild tortuosity. Normal SCA and PCA origins. Posterior communicating arteries are diminutive or absent. Bilateral PCA branches are patent with minimal to mild irregularity on the right. Anterior circulation: Patent ICA siphons. Mild to moderate calcified plaque on the left with minimal to mild stenosis. Normal left ophthalmic artery origin. Lesser calcified plaque on the right except in the supraclinoid segment where there is moderate distal right ICA stenosis (series 10, image 85). Normal right ophthalmic artery origin. Patent carotid termini. Normal MCA and ACA origins. Diminutive anterior communicating artery. Bilateral ACA branches are within normal limits. The left MCA M1, bifurcation, and left MCA branches are within normal limits. The right MCA M1, trifurcation, and right MCA branches are within normal limits. Venous sinuses: Patent. The right transverse and sigmoid sinus are dominant. Anatomic variants: Dominant right vertebral artery. The left is diminutive but remains patent to the basilar. Review of the MIP images confirms the above findings IMPRESSION: 1. Negative for large vessel occlusion, and CTP detects no core infarct or penumbra. 2. No atherosclerosis or stenosis in the neck. The Right vertebral artery is dominant. 3. Moderate intracranial atherosclerosis affecting the right vertebral artery and both ICA siphons. Associated moderate Right ICA supraclinoid segment stenosis, and mild to moderate stenosis of the dominant distal Right vertebral artery. 4. Circle-of-Willis branches are within normal limits. 5. These results were communicated to Dr. Cheral Marker  at 1:47 [facility] 08/18/2017 by text page via the [facility] messaging system. Electronically Signed   By: Genevie Ann M.D.   On: 08/18/2017 13:48   Mr Angiogram Head Wo Contrast  Result Date: 08/18/2017 CLINICAL DATA:  Headache and altered  mental status EXAM: MRI HEAD WITHOUT CONTRAST MRA HEAD WITHOUT CONTRAST TECHNIQUE: Multiplanar, multiecho pulse sequences of the brain and surrounding structures were obtained without intravenous contrast. Angiographic images of the head were obtained using MRA technique without contrast. COMPARISON:  Head CT 08/18/2017 Brain MRI 10/22/2016 FINDINGS: MRI HEAD FINDINGS Brain: The midline structures are normal. Abnormal diffusion restriction within the left pons, occupying an area measuring approximately 2.0 x 0.8 cm. No other diffusion abnormality. The brain parenchymal signal is normal. No acute hemorrhage or mass effect. Bilateral basal ganglia mineralization. No chronic microhemorrhage or cerebral amyloid angiopathy. No hydrocephalus, age advanced atrophy or lobar predominant volume loss. No dural abnormality or extra-axial collection. Skull and upper cervical spine: The visualized skull base, calvarium, upper cervical spine and extracranial soft tissues are normal. Sinuses/Orbits: Moderate paranasal sinus mucosal thickening. Normal orbits. MRA HEAD FINDINGS Intracranial internal carotid arteries: Normal. Anterior cerebral arteries: Normal. Middle cerebral arteries: Normal. Posterior communicating arteries: Absent Posterior cerebral arteries: Normal. Basilar artery: Normal. Vertebral arteries: Right dominant.  Normal. Superior cerebellar arteries: Normal. Anterior inferior cerebellar arteries: Normal. Posterior inferior cerebellar arteries: Normal. IMPRESSION: 1. 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 2. Normal intracranial MRA. Electronically Signed   By: Ulyses Jarred M.D.   On: 08/18/2017 15:42   Mr Brain Wo Contrast  Result Date: 08/18/2017 CLINICAL DATA:  Headache and altered mental status EXAM: MRI HEAD WITHOUT CONTRAST MRA HEAD WITHOUT CONTRAST TECHNIQUE: Multiplanar, multiecho pulse sequences of the brain and surrounding structures were obtained without intravenous  contrast. Angiographic images of the head were obtained using MRA technique without contrast. COMPARISON:  Head CT 08/18/2017 Brain MRI 10/22/2016 FINDINGS: MRI HEAD FINDINGS Brain: The midline structures are normal. Abnormal diffusion restriction within the left pons, occupying an area measuring approximately 2.0 x 0.8 cm. No other diffusion abnormality. The brain parenchymal signal is normal. No acute hemorrhage or mass effect. Bilateral basal ganglia mineralization. No chronic microhemorrhage or cerebral amyloid angiopathy. No hydrocephalus, age advanced atrophy or lobar predominant volume loss. No dural abnormality or extra-axial collection. Skull and upper cervical spine: The visualized skull base, calvarium, upper cervical spine and extracranial soft tissues are normal. Sinuses/Orbits: Moderate paranasal sinus mucosal thickening. Normal orbits. MRA HEAD FINDINGS Intracranial internal carotid arteries: Normal. Anterior cerebral arteries: Normal. Middle cerebral arteries: Normal. Posterior communicating arteries: Absent Posterior cerebral arteries: Normal. Basilar artery: Normal. Vertebral arteries: Right dominant.  Normal. Superior cerebellar arteries: Normal. Anterior inferior cerebellar arteries: Normal. Posterior inferior cerebellar arteries: Normal. IMPRESSION: 1. 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 2. Normal intracranial MRA. Electronically Signed   By: Ulyses Jarred M.D.   On: 08/18/2017 15:42   Ct Cerebral Perfusion W Contrast  Result Date: 08/18/2017 CLINICAL DATA:  57 year old male with numbness, tingling, paresthesia. Slurred speech. Code stroke. EXAM: CT ANGIOGRAPHY HEAD AND NECK CT PERFUSION BRAIN TECHNIQUE: Multidetector CT imaging of the head and neck was performed using the standard protocol during bolus administration of intravenous contrast. Multiplanar CT image reconstructions and MIPs were obtained to evaluate the vascular anatomy. Carotid stenosis  measurements (when applicable) are obtained utilizing NASCET criteria, using the distal internal carotid diameter as the denominator. Multiphase CT imaging of the brain was performed  following IV bolus contrast injection. Subsequent parametric perfusion maps were calculated using RAPID software. CONTRAST:  50mL ISOVUE-370 IOPAMIDOL (ISOVUE-370) INJECTION 76% COMPARISON:  Head CT without contrast 0948 hours today. Brain MRI 10/22/2016. FINDINGS: CT Brain Perfusion Findings: I observe and ASPECTS of 10 at 0949 hours today. CBF (<30%) Volume: 0 milliliters Perfusion (Tmax>6.0s) volume: 0 milliliters Mismatch Volume: Not applicable Infarction Location:Not applicable CTA NECK Skeleton: No acute osseous abnormality identified. Degenerative changes in the cervical spine. Scattered paranasal sinus mucosal thickening and opacification. Upper chest: Negative lung apices. No superior mediastinal lymphadenopathy. Other neck: Negative.  No neck mass or lymphadenopathy identified. Aortic arch: 3 vessel arch configuration. Little to no arch and great vessel origin atherosclerosis. Right carotid system: Negative. Left carotid system: Mild tortuosity of the proximal left ICA, otherwise negative. Vertebral arteries: No proximal right subclavian artery plaque or stenosis. Normal right vertebral artery origin. The right vertebral artery is dominant and normal to the skull base. Minimal if any proximal left subclavian artery soft plaque without stenosis. Normal left vertebral artery origin. The left vertebral artery is non dominant but has only a slightly smaller caliber than typical throughout much of the neck. It is patent to the skull base without stenosis. CTA HEAD Posterior circulation: There is moderate to severe calcified plaque of the right vertebral artery V4 segment beginning a little proximal to the right PICA origin which remains patent. Subsequent distal right vertebral artery stenosis is mild or at most moderate. There is  mild calcified plaque of the non dominant distal left V4 segment which remains patent to the vertebrobasilar junction. The left AICA appears dominant. Patent basilar artery with mild tortuosity. Normal SCA and PCA origins. Posterior communicating arteries are diminutive or absent. Bilateral PCA branches are patent with minimal to mild irregularity on the right. Anterior circulation: Patent ICA siphons. Mild to moderate calcified plaque on the left with minimal to mild stenosis. Normal left ophthalmic artery origin. Lesser calcified plaque on the right except in the supraclinoid segment where there is moderate distal right ICA stenosis (series 10, image 85). Normal right ophthalmic artery origin. Patent carotid termini. Normal MCA and ACA origins. Diminutive anterior communicating artery. Bilateral ACA branches are within normal limits. The left MCA M1, bifurcation, and left MCA branches are within normal limits. The right MCA M1, trifurcation, and right MCA branches are within normal limits. Venous sinuses: Patent. The right transverse and sigmoid sinus are dominant. Anatomic variants: Dominant right vertebral artery. The left is diminutive but remains patent to the basilar. Review of the MIP images confirms the above findings IMPRESSION: 1. Negative for large vessel occlusion, and CTP detects no core infarct or penumbra. 2. No atherosclerosis or stenosis in the neck. The Right vertebral artery is dominant. 3. Moderate intracranial atherosclerosis affecting the right vertebral artery and both ICA siphons. Associated moderate Right ICA supraclinoid segment stenosis, and mild to moderate stenosis of the dominant distal Right vertebral artery. 4. Circle-of-Willis branches are within normal limits. 5. These results were communicated to Dr. Cheral Marker at [facility] 08/18/2017 by text page via the [facility] messaging system. Electronically Signed   By: Genevie Ann M.D.   On: 08/18/2017 13:48       Subjective: Patient is feeling  better, persistent right sided weakness, not back to baseline, no chest pain or dyspnea. Positive pain at the base of the head and upper neck, worse with movement.   Discharge Exam: Vitals:   08/20/17 0402 08/20/17 0825  BP: 133/73 (!) 126/93  Pulse:  60 68  Resp: 20 16  Temp: 97.8 F (36.6 C) 97.8 F (36.6 C)  SpO2: 100% 98%   Vitals:   08/19/17 2035 08/20/17 0032 08/20/17 0402 08/20/17 0825  BP: (!) 156/81 (!) 148/87 133/73 (!) 126/93  Pulse: 84 64 60 68  Resp: 20 20 20 16   Temp: 98.2 F (36.8 C) 97.9 F (36.6 C) 97.8 F (36.6 C) 97.8 F (36.6 C)  TempSrc: Oral Oral Oral Oral  SpO2: 93% 99% 100% 98%  Weight:      Height:        General: Not in pain or dyspnea, Neurology: Awake and alert, slow speech, following commands. Decreases strength 4./5 at the right upper and lower extremities.  E ENT: no pallor, no icterus, oral mucosa moist Cardiovascular: No JVD. S1-S2 present, rhythmic, no gallops, rubs, or murmurs. No lower extremity edema. Pulmonary: vesicular breath sounds bilaterally, adequate air movement, no wheezing, rhonchi or rales. Gastrointestinal. Abdomen with no organomegaly, non tender, no rebound or guarding Skin. No rashes Musculoskeletal: no joint deformities   The results of significant diagnostics from this hospitalization (including imaging, microbiology, ancillary and laboratory) are listed below for reference.     Microbiology: No results found for this or any previous visit (from the past 240 hour(s)).   Labs: BNP (last 3 results) No results for input(s): BNP in the last 8760 hours. Basic Metabolic Panel: Recent Labs  Lab 08/18/17 0926 08/20/17 0452  NA 140 144  K 4.0 4.2  CL 110 108  CO2 24 28  GLUCOSE 106* 102*  BUN 15 17  CREATININE 0.94 0.81  CALCIUM 8.9 8.8*   Liver Function Tests: Recent Labs  Lab 08/18/17 0926  AST 18  ALT 16*  ALKPHOS 49  BILITOT 0.5  PROT 6.2*  ALBUMIN 3.8   No results for input(s): LIPASE, AMYLASE in  the last 168 hours. No results for input(s): AMMONIA in the last 168 hours. CBC: Recent Labs  Lab 08/18/17 0926  WBC 8.7  NEUTROABS 5.6  HGB 13.5  HCT 41.1  MCV 89.5  PLT 170   Cardiac Enzymes: Recent Labs  Lab 08/18/17 0926  TROPONINI <0.03   BNP: Invalid input(s): POCBNP CBG: No results for input(s): GLUCAP in the last 168 hours. D-Dimer No results for input(s): DDIMER in the last 72 hours. Hgb A1c Recent Labs    08/19/17 0627  HGBA1C 5.6   Lipid Profile Recent Labs    08/19/17 0627  CHOL 208*  HDL 29*  LDLCALC 109*  TRIG 349*  CHOLHDL 7.2   Thyroid function studies No results for input(s): TSH, T4TOTAL, T3FREE, THYROIDAB in the last 72 hours.  Invalid input(s): FREET3 Anemia work up No results for input(s): VITAMINB12, FOLATE, FERRITIN, TIBC, IRON, RETICCTPCT in the last 72 hours. Urinalysis    Component Value Date/Time   COLORURINE YELLOW 08/18/2017 1408   APPEARANCEUR HAZY (A) 08/18/2017 1408   LABSPEC 1.024 08/18/2017 1408   PHURINE 7.0 08/18/2017 1408   GLUCOSEU NEGATIVE 08/18/2017 1408   HGBUR NEGATIVE 08/18/2017 1408   HGBUR negative 01/03/2010 0836   BILIRUBINUR NEGATIVE 08/18/2017 1408   BILIRUBINUR N 06/30/2017 0935   KETONESUR NEGATIVE 08/18/2017 1408   PROTEINUR NEGATIVE 08/18/2017 1408   UROBILINOGEN 0.2 06/30/2017 0935   UROBILINOGEN 0.2 09/08/2014 1448   NITRITE NEGATIVE 08/18/2017 1408   LEUKOCYTESUR NEGATIVE 08/18/2017 1408   Sepsis Labs Invalid input(s): PROCALCITONIN,  WBC,  LACTICIDVEN Microbiology No results found for this or any previous visit (from the past 240 hour(s)).  Time coordinating discharge: 45 minutes  SIGNED:   Tawni Millers, MD  [facility] 08/20/2017, 11:32 AM Pager [phone]  If 7PM-7AM, please contact night-coverage [email or web address] Password [email or web address]
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
## 2017-08-20 NOTE — Progress Notes (Signed)
Pt arrived to the unit at approximately 1720. Pt appeared to be in good spirits and eager to began rehabilitation. I educated pt about the unit and what to expect. His wife joined him at the bedside and both were educated about the stroke resource book provided to them. Pt left sitting in chair while eating supper with seat alarm on and call light within reach.

## 2017-08-21 ENCOUNTER — Inpatient Hospital Stay (HOSPITAL_COMMUNITY): Payer: BLUE CROSS/BLUE SHIELD | Admitting: Occupational Therapy

## 2017-08-21 ENCOUNTER — Inpatient Hospital Stay (HOSPITAL_COMMUNITY): Payer: BLUE CROSS/BLUE SHIELD | Admitting: Speech Pathology

## 2017-08-21 ENCOUNTER — Inpatient Hospital Stay (HOSPITAL_COMMUNITY): Payer: BLUE CROSS/BLUE SHIELD | Admitting: Physical Therapy

## 2017-08-21 DIAGNOSIS — I10 Essential (primary) hypertension: Secondary | ICD-10-CM

## 2017-08-21 LAB — CBC WITH DIFFERENTIAL/PLATELET
Basophils Absolute: 0 10*3/uL (ref 0.0–0.1)
Basophils Relative: 0 %
Eosinophils Absolute: 0.3 10*3/uL (ref 0.0–0.7)
Eosinophils Relative: 4 %
HCT: 42.1 % (ref 39.0–52.0)
Hemoglobin: 13.8 g/dL (ref 13.0–17.0)
Lymphocytes Relative: 31 %
Lymphs Abs: 2.3 10*3/uL (ref 0.7–4.0)
MCH: 29.2 pg (ref 26.0–34.0)
MCHC: 32.8 g/dL (ref 30.0–36.0)
MCV: 89 fL (ref 78.0–100.0)
Monocytes Absolute: 0.5 10*3/uL (ref 0.1–1.0)
Monocytes Relative: 7 %
Neutro Abs: 4.1 10*3/uL (ref 1.7–7.7)
Neutrophils Relative %: 58 %
Platelets: 156 10*3/uL (ref 150–400)
RBC: 4.73 MIL/uL (ref 4.22–5.81)
RDW: 12.9 % (ref 11.5–15.5)
WBC: 7.2 10*3/uL (ref 4.0–10.5)

## 2017-08-21 LAB — COMPREHENSIVE METABOLIC PANEL
ALT: 22 U/L (ref 17–63)
AST: 21 U/L (ref 15–41)
Albumin: 3.6 g/dL (ref 3.5–5.0)
Alkaline Phosphatase: 50 U/L (ref 38–126)
Anion gap: 7 (ref 5–15)
BUN: 14 mg/dL (ref 6–20)
CO2: 26 mmol/L (ref 22–32)
Calcium: 8.8 mg/dL — ABNORMAL LOW (ref 8.9–10.3)
Chloride: 107 mmol/L (ref 101–111)
Creatinine, Ser: 0.77 mg/dL (ref 0.61–1.24)
GFR calc Af Amer: >60 mL/min (ref >60)
GFR calc non Af Amer: >60 mL/min (ref >60)
Glucose, Bld: 95 mg/dL (ref 65–99)
Potassium: 3.9 mmol/L (ref 3.5–5.1)
Sodium: 140 mmol/L (ref 135–145)
Total Bilirubin: 0.4 mg/dL (ref 0.3–1.2)
Total Protein: 6.1 g/dL — ABNORMAL LOW (ref 6.5–8.1)

## 2017-08-21 NOTE — Care Management Note (Signed)
[facility] Individual Statement of Services  Patient Name:  Riley Hartman  Date:  08/21/2017  Welcome to the [facility].  Our goal is to provide you with an individualized program based on your diagnosis and situation, designed to meet your specific needs.  With this comprehensive rehabilitation program, you will be expected to participate in at least 3 hours of rehabilitation therapies Monday-Friday, with modified therapy programming on the weekends.  Your rehabilitation program will include the following services:  Physical Therapy (PT), Occupational Therapy (OT), Speech Therapy (ST), 24 hour per day rehabilitation nursing, Therapeutic Recreaction (TR), Neuropsychology, Case Management (Social Worker), Rehabilitation Medicine, Nutrition Services and Pharmacy Services  Weekly team conferences will be held on Wednesday to discuss your progress.  Your Social Worker will talk with you frequently to get your input and to update you on team discussions.  Team conferences with you and your family in attendance may also be held.  Expected length of stay: 8-12 days Overall anticipated outcome: mod/i level  Depending on your progress and recovery, your program may change. Your Social Worker will coordinate services and will keep you informed of any changes. Your Social Worker's name and contact numbers are listed  below.  The following services may also be recommended but are not provided by the [facility] will be made to provide these services after discharge if needed.  Arrangements include referral to agencies that provide these services.  Your insurance has been verified to be:  [place] Your primary doctor is:  Alysia Penna  Pertinent information will be shared with your doctor and your insurance  company.  Social Worker:  Ovidio Kin, [place] or ([phone]  Information discussed with and copy given to patient by: Elease Hashimoto, 08/21/2017, 10:36 AM
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
## 2017-08-21 NOTE — Progress Notes (Signed)
Subjective/Complaints:   Objective: Vital Signs: Blood pressure (!) 134/92, pulse (!) 57, temperature 97.6 F (36.4 C), temperature source Oral, resp. rate 16, SpO2 95 %. No results found. Results for orders placed or performed during the hospital encounter of 08/20/17 (from the past 72 hour(s))  CBC WITH DIFFERENTIAL     Status: None   Collection Time: 08/21/17  5:09 AM  Result Value Ref Range   WBC 7.2 4.0 - 10.5 K/uL   RBC 4.73 4.22 - 5.81 MIL/uL   Hemoglobin 13.8 13.0 - 17.0 g/dL   HCT 42.1 39.0 - 52.0 %   MCV 89.0 78.0 - 100.0 fL   MCH 29.2 26.0 - 34.0 pg   MCHC 32.8 30.0 - 36.0 g/dL   RDW 12.9 11.5 - 15.5 %   Platelets 156 150 - 400 K/uL   Neutrophils Relative % 58 %   Neutro Abs 4.1 1.7 - 7.7 K/uL   Lymphocytes Relative 31 %   Lymphs Abs 2.3 0.7 - 4.0 K/uL   Monocytes Relative 7 %   Monocytes Absolute 0.5 0.1 - 1.0 K/uL   Eosinophils Relative 4 %   Eosinophils Absolute 0.3 0.0 - 0.7 K/uL   Basophils Relative 0 %   Basophils Absolute 0.0 0.0 - 0.1 K/uL    Comment: Performed at [facility], [address], [place], [place] [address]  Comprehensive metabolic panel     Status: Abnormal   Collection Time: 08/21/17  5:09 AM  Result Value Ref Range   Sodium 140 135 - 145 mmol/L   Potassium 3.9 3.5 - 5.1 mmol/L   Chloride 107 101 - 111 mmol/L   CO2 26 22 - 32 mmol/L   Glucose, Bld 95 65 - 99 mg/dL   BUN 14 6 - 20 mg/dL   Creatinine, Ser 0.77 0.61 - 1.24 mg/dL   Calcium 8.8 (L) 8.9 - 10.3 mg/dL   Total Protein 6.1 (L) 6.5 - 8.1 g/dL   Albumin 3.6 3.5 - 5.0 g/dL   AST 21 15 - 41 U/L   ALT 22 17 - 63 U/L   Alkaline Phosphatase 50 38 - 126 U/L   Total Bilirubin 0.4 0.3 - 1.2 mg/dL   GFR calc non Af Amer >60 >60 mL/min   GFR calc Af Amer >60 >60 mL/min    Comment: (NOTE) The eGFR has been calculated using the CKD EPI equation. This calculation has not been validated in all clinical situations. eGFR's persistently <60 mL/min signify possible Chronic  Kidney Disease.    Anion gap 7 5 - 15    Comment: Performed at [facility] [address], [place], [place] [address]     HEENT: normal and mild Right facial droop Cardio: RRR and no murmur Resp: CTA B/L and unlabored GI: BS positive and NT, ND Extremity:  Pulses positive and No Edema Skin:   Intact Neuro: Alert/Oriented, Abnormal Sensory intact light touch but has parasthesia LUE and LLE, Abnormal Motor 4- RUE and RLE, Abnormal FMC Ataxic/ dec FMC and Dysarthric Musc/Skel:  Other no pain with UE or LE ROM Gen NAD, + dysmetria R FNF and R H->S   Assessment/Plan: 1. Functional deficits secondary to Left pontine infarct with right hemiparesis which require 3+ hours per day of interdisciplinary therapy in a comprehensive inpatient rehab setting. Physiatrist is providing close team supervision and 24 hour management of active medical problems listed below. Physiatrist and rehab team continue to assess barriers to discharge/monitor patient progress toward functional and  medical goals. FIM:                                  Medical Problem List and Plan: 1. Right hemiparesis, dysarthria, dysmetria, and gait disturbance secondary to Left pontine infarct Initial evals today 2. DVT Prophylaxis/Anticoagulation: Pharmaceutical:Lovenox 3.DDD/Pain Management: Voltaren gel to neck/shoulders. Tylenol prn 4. Mood:Continue klonopin prn for anxiety. LCSW to follow for evaluation and support.  5. Neuropsych: This patientiscapable of making decisions on hisown behalf. 6. Skin/Wound Care:routine pressure relief measures 7. Fluids/Electrolytes/Nutrition:Monitor I/O. Check lytes in am. BMET essentially normal 8. HTN: Permissive HTN for 24- 48 hours then gradual normalization. Continues to be labile. Continue lisinopril and titrate as indicated.  Vitals:   08/20/17 2135 08/21/17 0536  BP: 137/90 (!) 134/92  Pulse: 60 (!) 57  Resp:  16  Temp:  97.6 F (36.4 C)   SpO2: 97% 95%  controlled 5/10 in adequate range 9. Dyslipidemia: now on Lipitor.  10. BPH?: continue Proscar and Flomax    LOS (Days) 1 A FACE TO FACE EVALUATION WAS PERFORMED  Riley Hartman 08/21/2017, 8:38 AM
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
## 2017-08-22 ENCOUNTER — Inpatient Hospital Stay (HOSPITAL_COMMUNITY): Payer: BLUE CROSS/BLUE SHIELD | Admitting: Physical Therapy

## 2017-08-22 ENCOUNTER — Inpatient Hospital Stay (HOSPITAL_COMMUNITY): Payer: BLUE CROSS/BLUE SHIELD | Admitting: Occupational Therapy

## 2017-08-22 ENCOUNTER — Inpatient Hospital Stay (HOSPITAL_COMMUNITY): Payer: BLUE CROSS/BLUE SHIELD | Admitting: Speech Pathology

## 2017-08-22 NOTE — Progress Notes (Signed)
Occupational Therapy Session Note  Patient Details  Name: KRUZE ATCHLEY MRN: [id number] Date of Birth: [birth year]  Today's Date: 08/22/2017 OT Individual Time: 1340-1440 OT Individual Time Calculation (min): 60 min     Skilled Therapeutic Interventions/Progress Updates: patient participated in Nu Step and right hand skills, including grip strength.   As well, bi cep strength to increase hand to mouth skills for decreased spillage during self feeding (he reported he has about 50% food spillage when eating ).   Also he was introduced to right hand/wrist retrograde massage and propping hand on pillow and as well and making hand fists with hands raised toward ceiling.  He was left with call bell without reach.     Therapy Documentation Precautions:  Precautions Precautions: Fall Restrictions Weight Bearing Restrictions: No  Pain: Pain Assessment Pain Scale: 0-10 Pain Score: 0-No pain  See Function Navigator for Current Functional Status.   Therapy/Group: Individual Therapy  Alfredia Ferguson [facility] 08/22/2017, 11:10 PM

## 2017-08-22 NOTE — Progress Notes (Signed)
Physical Therapy Session Note  Patient Details  Name: Riley Hartman MRN: [id number] Date of Birth: 11-23-1960  Today's Date: 08/22/2017 PT Individual Time: 1445-1600 PT Individual Time Calculation (min): 75 min   Short Term Goals: Week 1:  PT Short Term Goal 1 (Week 1): Pt will ambulate 150' w/ supervision using LRAD PT Short Term Goal 2 (Week 1): Pt will maintain dynamic standing balance w/ supervision  Skilled Therapeutic Interventions/Progress Updates:   Pt in w/c and agreeable to therapy, denies pain. Total assist w/c transport to gym for time management. Session focused on NMR of RLE w/ emphasis on activation of posterior chain musculature. Performed kinetron in alternating seated and standing in 30 sec bouts at 50 cm/sec w/ min guard for safety when standing. Verbal cues for slow and controlled movement. Performed dynamic standing balance while in partial knee bend w/ close supervision for safety. Pt tends to have posterior LOB which he corrects by reaching down to w/c behind him. Provided w/ HEP that emphasizes activation hamstring and glut musculature including heel slides, hamstring curls w/ orange theraband, supine bridges, and sidelying hip extension. Pt demo-ed correctly and verbalized understanding about frequency of sets and reps when outside of therapy. Provided w/ written handout. Practiced gait w/ ace wrap performing DF assist on R ankle, some improvement in extension thrust but still present w/ each step. Educated pt on use of posterior leaf spring including waiting to introduce external bracing so he does not rely on the support. Pt verbalized understanding and in agreement. Returned to room and ended session in w/c, call bell within reach and all needs met.   Therapy Documentation Precautions:  Precautions Precautions: Fall Restrictions Weight Bearing Restrictions: No Vital Signs: Therapy Vitals Temp: 98.2 F (36.8 C) Temp Source: Oral Pulse Rate: 87 Resp: 19 BP:  137/86 Patient Position (if appropriate): Sitting Oxygen Therapy SpO2: 97 % O2 Device: Room Air  See Function Navigator for Current Functional Status.   Therapy/Group: Individual Therapy  Danney Bungert K Arnette 08/22/2017, 4:27 PM

## 2017-08-22 NOTE — Progress Notes (Signed)
Subjective/Complaints: Anxious to get moving today.   ROS: Patient denies fever, rash, sore throat, blurred vision, nausea, vomiting, diarrhea, cough, shortness of breath or chest pain, joint or back pain, headache, or mood change.   Objective: Vital Signs: Blood pressure (!) 149/94, pulse 66, temperature 98.5 F (36.9 C), temperature source Oral, resp. rate 16, SpO2 100 %. No results found. Results for orders placed or performed during the hospital encounter of 08/20/17 (from the past 72 hour(s))  CBC WITH DIFFERENTIAL     Status: None   Collection Time: 08/21/17  5:09 AM  Result Value Ref Range   WBC 7.2 4.0 - 10.5 K/uL   RBC 4.73 4.22 - 5.81 MIL/uL   Hemoglobin 13.8 13.0 - 17.0 g/dL   HCT 42.1 39.0 - 52.0 %   MCV 89.0 78.0 - 100.0 fL   MCH 29.2 26.0 - 34.0 pg   MCHC 32.8 30.0 - 36.0 g/dL   RDW 12.9 11.5 - 15.5 %   Platelets 156 150 - 400 K/uL   Neutrophils Relative % 58 %   Neutro Abs 4.1 1.7 - 7.7 K/uL   Lymphocytes Relative 31 %   Lymphs Abs 2.3 0.7 - 4.0 K/uL   Monocytes Relative 7 %   Monocytes Absolute 0.5 0.1 - 1.0 K/uL   Eosinophils Relative 4 %   Eosinophils Absolute 0.3 0.0 - 0.7 K/uL   Basophils Relative 0 %   Basophils Absolute 0.0 0.0 - 0.1 K/uL    Comment: Performed at [facility], [address], [place], [place] [address]  Comprehensive metabolic panel     Status: Abnormal   Collection Time: 08/21/17  5:09 AM  Result Value Ref Range   Sodium 140 135 - 145 mmol/L   Potassium 3.9 3.5 - 5.1 mmol/L   Chloride 107 101 - 111 mmol/L   CO2 26 22 - 32 mmol/L   Glucose, Bld 95 65 - 99 mg/dL   BUN 14 6 - 20 mg/dL   Creatinine, Ser 0.77 0.61 - 1.24 mg/dL   Calcium 8.8 (L) 8.9 - 10.3 mg/dL   Total Protein 6.1 (L) 6.5 - 8.1 g/dL   Albumin 3.6 3.5 - 5.0 g/dL   AST 21 15 - 41 U/L   ALT 22 17 - 63 U/L   Alkaline Phosphatase 50 38 - 126 U/L   Total Bilirubin 0.4 0.3 - 1.2 mg/dL   GFR calc non Af Amer >60 >60 mL/min   GFR calc Af Amer >60 >60 mL/min   Comment: (NOTE) The eGFR has been calculated using the CKD EPI equation. This calculation has not been validated in all clinical situations. eGFR's persistently <60 mL/min signify possible Chronic Kidney Disease.    Anion gap 7 5 - 15    Comment: Performed at [facility] [address], [place], [place] [address]     Constitutional: No distress . Vital signs reviewed. HEENT: EOMI, oral membranes moist Neck: supple Cardiovascular: RRR without murmur. No JVD    Respiratory: CTA Bilaterally without wheezes or rales. Normal effort    GI: BS +, non-tender, non-distended  Skin:   Intact Neuro: Alert/Oriented, Abnormal Sensory intact light touch but has parasthesia LUE and LLE, Abnormal Motor 4- RUE and RLE, Abnormal FMC Ataxic/ dec FMC and speech dysarthric Musc/Skel:  Other no pain with UE or LE ROM Gen NAD, + dysmetria R FNF and R H->S   Assessment/Plan: 1. Functional deficits secondary to Left pontine infarct with right hemiparesis which require 3+  hours per day of interdisciplinary therapy in a comprehensive inpatient rehab setting. Physiatrist is providing close team supervision and 24 hour management of active medical problems listed below. Physiatrist and rehab team continue to assess barriers to discharge/monitor patient progress toward functional and medical goals. FIM: Function - Bathing Position: Shower Body parts bathed by patient: Right arm, Left arm, Chest, Abdomen, Front perineal area, Buttocks, Right upper leg, Left upper leg, Right lower leg, Left lower leg Assist Level: Touching or steadying assistance(Pt > 75%)  Function- Upper Body Dressing/Undressing What is the patient wearing?: Pull over shirt/dress Pull over shirt/dress - Perfomed by patient: Thread/unthread right sleeve, Thread/unthread left sleeve, Put head through opening, Pull shirt over trunk Assist Level: Supervision or verbal cues Function - Lower Body Dressing/Undressing What is the patient  wearing?: Pants, Shoes Position: Wheelchair/chair at sink Pants- Performed by patient: Thread/unthread right pants leg, Thread/unthread left pants leg Pants- Performed by helper: Pull pants up/down Shoes - Performed by patient: Don/doff left shoe Shoes - Performed by helper: Don/doff right shoe, Fasten right, Fasten left Assist for footwear: Partial/moderate assist Assist for lower body dressing: Touching or steadying assistance (Pt > 75%)  Function - Toileting Toileting steps completed by patient: Adjust clothing after toileting, Adjust clothing prior to toileting Toileting steps completed by helper: Adjust clothing prior to toileting, Performs perineal hygiene, Adjust clothing after toileting  Function - Air cabin crew transfer assistive device: Grab bar, Walker Assist level to toilet: Touching or steadying assistance (Pt > 75%) Assist level from toilet: Touching or steadying assistance (Pt > 75%)  Function - Chair/bed transfer Chair/bed transfer method: Stand pivot, Ambulatory Chair/bed transfer assist level: Touching or steadying assistance (Pt > 75%) Chair/bed transfer assistive device: Armrests, Walker Chair/bed transfer details: Verbal cues for safe use of DME/AE, Verbal cues for precautions/safety, Verbal cues for technique  Function - Locomotion: Wheelchair Will patient use wheelchair at discharge?: (TBD) Type: Manual Max wheelchair distance: 200' Assist Level: Supervision or verbal cues Assist Level: Supervision or verbal cues Assist Level: Supervision or verbal cues Turns around,maneuvers to table,bed, and toilet,negotiates 3% grade,maneuvers on rugs and over doorsills: No Function - Locomotion: Ambulation Assistive device: Walker-rolling Max distance: 150' Assist level: Touching or steadying assistance (Pt > 75%) Assist level: Touching or steadying assistance (Pt > 75%) Assist level: Touching or steadying assistance (Pt > 75%) Assist level: Touching or  steadying assistance (Pt > 75%) Walk 10 feet on uneven surfaces activity did not occur: Safety/medical concerns  Function - Comprehension Comprehension: Auditory Comprehension assist level: Follows complex conversation/direction with no assist  Function - Expression Expression: Verbal Expression assist level: Expresses basic 90% of the time/requires cueing < 10% of the time.  Function - Social Interaction Social Interaction assist level: Interacts appropriately with others with medication or extra time (anti-anxiety, antidepressant).  Function - Problem Solving Problem solving assist level: Solves complex problems: With extra time  Function - Memory Memory assist level: More than reasonable amount of time Patient normally able to recall (first 3 days only): Current season, Location of own room, That he or she is in a hospital, Staff names and faces  Medical Problem List and Plan: 1. Right hemiparesis, dysarthria, dysmetria, and gait disturbance secondary to Left pontine infarct   -continue PT, OT, SLP 2. DVT Prophylaxis/Anticoagulation: Pharmaceutical:Lovenox 3.DDD/Pain Management: Voltaren gel to neck/shoulders. Tylenol prn 4. Mood:Continue klonopin prn for anxiety. LCSW to follow for evaluation and support.  5. Neuropsych: This patientiscapable of making decisions on hisown behalf. 6. Skin/Wound Care:routine pressure relief  measures 7. Fluids/Electrolytes/Nutrition:encourage PO 8. HTN: Permissive HTN for 24- 48 hours then gradual normalization.  Vitals:   08/21/17 1951 08/22/17 0600  BP: (!) 141/87 (!) 149/94  Pulse: 79 66  Resp:  16  Temp:    SpO2: 96% 100%  controlled 5/11   9. Dyslipidemia: now on Lipitor.  10. BPH?: continue Proscar and Flomax    LOS (Days) 2 A FACE TO FACE EVALUATION WAS PERFORMED  Meredith Staggers 08/22/2017, 9:41 AM

## 2017-08-22 NOTE — Progress Notes (Signed)
Speech Language Pathology Daily Session Note  Patient Details  Name: Riley Hartman MRN: [id number] Date of Birth: [birth year]  Today's Date: 08/22/2017 SLP Individual Time: 8416-6063 SLP Individual Time Calculation (min): 55 min  Short Term Goals: Week 1: SLP Short Term Goal 1 (Week 1): STG=LTG due to ELOS  Skilled Therapeutic Interventions: Skilled treatment session focused on speech goals. SLP facilitated session by providing intermittent supervision verbal cues for patient to self-monitor and correct errors during an informal but functional conversation to achieve 100% intelligibility. However, patient was Mod I for use of speech intelligibility strategies at the word, phrase and sentence level. Patient left upright in bed with all needs within reach. Continue with current plan of care.      Function:   Cognition Comprehension Comprehension assist level: Follows complex conversation/direction with no assist  Expression   Expression assist level: Expresses complex 90% of the time/cues < 10% of the time  Social Interaction Social Interaction assist level: Interacts appropriately with others with medication or extra time (anti-anxiety, antidepressant).  Problem Solving Problem solving assist level: Solves complex problems: With extra time  Memory Memory assist level: More than reasonable amount of time    Pain No/Denies Pain   Therapy/Group: Individual Therapy  Shanedra Lave 08/22/2017, 12:47 PM

## 2017-08-22 NOTE — Progress Notes (Deleted)
Occupational Therapy Session Note  Patient Details  Name: Riley Hartman MRN: [id number] Date of Birth: [birth year]  Today's Date: 08/22/2017 OT Individual Time: 1300-1320 OT Individual Time Calculation (min): 20 min   Skilled Therapeutic Interventions/Progress Updates: lpatient stated he was too fatigued to get out of bed this session and wanted to visit with his son-in-law and grand dtr.    He and his son in law continued asking questions regarding his requirements to leave.   Goals and fnctional OT upon rehab arrival.    This clinician attempted to see patient later in the evening, but he was asleep with all lights in room turned off and his door closed.  Patient is scheduled for next OT session on tomorrow     Therapy Documentation Precautions:  Precautions Precautions: Fall Restrictions Weight Bearing Restrictions: No General: General OT Amount of Missed Time: 40 Minutes(40) Pain: Pain Assessment Pain Scale: 0-10 Pain Score: 0-No pain   Therapy/Group: Individual Therapy  Alfredia Ferguson [facility] 08/22/2017, 10:47 PM

## 2017-08-23 ENCOUNTER — Inpatient Hospital Stay (HOSPITAL_COMMUNITY): Payer: BLUE CROSS/BLUE SHIELD | Admitting: Occupational Therapy

## 2017-08-23 NOTE — IPOC Note (Signed)
Overall Plan of Care [facility]) Patient Details Name: Riley Hartman MRN: [id number] DOB: 10/22/1960  Admitting Diagnosis: <principal problem not specified>left CVA  Hospital Problems: Active Problems:   Cerebral thrombosis with cerebral infarction   Dysarthria, post-stroke   Gait disturbance, post-stroke   Right hemiparesis (HCC)     Functional Problem List: Nursing Bladder, Bowel, Edema, Sensory, Motor, Endurance, Medication Management, Pain  PT Balance, Behavior, Endurance, Motor, Perception, Safety, Sensory  OT Balance, Endurance, Motor, Perception, Safety, Sensory  SLP Linguistic  TR         Basic ADL's: OT Grooming, Dressing, Bathing, Toileting     Advanced  ADL's: OT       Transfers: PT Bed Mobility, Bed to Chair, Car, Floor, Manufacturing systems engineer, Metallurgist: PT Ambulation, Stairs     Additional Impairments: OT Fuctional Use of Upper Extremity  SLP Communication expression    TR      Anticipated Outcomes Item Anticipated Outcome  Self Feeding    Swallowing      Basic self-care  mod I  Toileting  supervision    Bathroom Transfers Supervision  Bowel/Bladder  min assist & calls appropriately   Transfers  Mod I   Locomotion  Mod I household gait  Communication  mod I   Cognition     Pain  less than 3 on a 0 to 10 scale  Safety/Judgment  calls appropriately for assistance   Therapy Plan: PT Intensity: Minimum of 1-2 x/day ,45 to 90 minutes PT Frequency: 5 out of 7 days PT Duration Estimated Length of Stay: 10-12 days OT Intensity: Minimum of 1-2 x/day, 45 to 90 minutes OT Frequency: 5 out of 7 days OT Duration/Estimated Length of Stay: 10-12 days SLP Intensity: Minumum of 1-2 x/day, 30 to 90 minutes SLP Frequency: 1 to 3 out of 7 days SLP Duration/Estimated Length of Stay: 7 days     Team Interventions: Nursing Interventions Patient/Family Education, Pain Management, Bladder Management, Medication Management, Discharge  Planning, Bowel Management, Skin Care/Wound Management, Psychosocial Support, Disease Management/Prevention  PT interventions Ambulation/gait training, Disease management/prevention, Stair training, Pain management, Visual/perceptual remediation/compensation, Wheelchair propulsion/positioning, Therapeutic Activities, Patient/family education, DME/adaptive equipment instruction, Training and development officer, Cognitive remediation/compensation, Functional electrical stimulation, Psychosocial support, Therapeutic Exercise, UE/LE Strength taining/ROM, Skin care/wound management, Functional mobility training, Community reintegration, Discharge planning, Neuromuscular re-education, Splinting/orthotics, UE/LE Coordination activities  OT Interventions Training and development officer, Academic librarian, Discharge planning, DME/adaptive equipment instruction, Functional mobility training, Neuromuscular re-education, Patient/family education, Self Care/advanced ADL retraining, Therapeutic Activities, Therapeutic Exercise, UE/LE Strength taining/ROM, UE/LE Coordination activities, Wheelchair propulsion/positioning  SLP Interventions Cueing hierarchy, Patient/family education, Speech/Language facilitation, Internal/external aids, Environmental controls  TR Interventions    SW/CM Interventions Discharge Planning, Psychosocial Support, Patient/Family Education   Barriers to Discharge MD  Medical stability  Nursing Incontinence, Medication compliance    PT Decreased caregiver support Wife works during day, pt unsure if other family members or friends can assist during the day, he wants to be able to stay at home by himself  OT      SLP      SW       Team Discharge Planning: Destination: PT-Home ,OT- Home , SLP-Home Projected Follow-up: PT-Home health PT, OT-  Home health OT, SLP-Other (comment)(TBD) Projected Equipment Needs: PT-To be determined, OT- To be determined, SLP-None recommended by SLP Equipment  Details: PT- , OT-  Patient/family involved in discharge planning: PT- Patient,  OT-Patient, SLP-Patient  MD ELOS: 10-12 days Medical Rehab Prognosis:  Excellent  Assessment: The patient has been admitted for CIR therapies with the diagnosis of left CVA. The team will be addressing functional mobility, strength, stamina, balance, safety, adaptive techniques and equipment, self-care, bowel and bladder mgt, patient and caregiver education, NMR, speech, communication, community reentry. Goals have been set at supervision to mod I with ADL's, mod I with mobility and speech.    Meredith Staggers, MD, FAAPMR      See Team Conference Notes for weekly updates to the plan of care

## 2017-08-23 NOTE — Progress Notes (Signed)
Subjective/Complaints: No new issues. Eager to get moving again with therapies. No new problems  ROS: Patient denies fever, rash, sore throat, blurred vision, nausea, vomiting, diarrhea, cough, shortness of breath or chest pain, joint or back pain, headache, or mood change.    Objective: Vital Signs: Blood pressure (!) 141/91, pulse 87, temperature 97.8 F (36.6 C), temperature source Oral, resp. rate 18, SpO2 97 %. No results found. Results for orders placed or performed during the hospital encounter of 08/20/17 (from the past 72 hour(s))  CBC WITH DIFFERENTIAL     Status: None   Collection Time: 08/21/17  5:09 AM  Result Value Ref Range   WBC 7.2 4.0 - 10.5 K/uL   RBC 4.73 4.22 - 5.81 MIL/uL   Hemoglobin 13.8 13.0 - 17.0 g/dL   HCT 42.1 39.0 - 52.0 %   MCV 89.0 78.0 - 100.0 fL   MCH 29.2 26.0 - 34.0 pg   MCHC 32.8 30.0 - 36.0 g/dL   RDW 12.9 11.5 - 15.5 %   Platelets 156 150 - 400 K/uL   Neutrophils Relative % 58 %   Neutro Abs 4.1 1.7 - 7.7 K/uL   Lymphocytes Relative 31 %   Lymphs Abs 2.3 0.7 - 4.0 K/uL   Monocytes Relative 7 %   Monocytes Absolute 0.5 0.1 - 1.0 K/uL   Eosinophils Relative 4 %   Eosinophils Absolute 0.3 0.0 - 0.7 K/uL   Basophils Relative 0 %   Basophils Absolute 0.0 0.0 - 0.1 K/uL    Comment: Performed at [facility], [address] [place], [place] [address]  Comprehensive metabolic panel     Status: Abnormal   Collection Time: 08/21/17  5:09 AM  Result Value Ref Range   Sodium 140 135 - 145 mmol/L   Potassium 3.9 3.5 - 5.1 mmol/L   Chloride 107 101 - 111 mmol/L   CO2 26 22 - 32 mmol/L   Glucose, Bld 95 65 - 99 mg/dL   BUN 14 6 - 20 mg/dL   Creatinine, Ser 0.77 0.61 - 1.24 mg/dL   Calcium 8.8 (L) 8.9 - 10.3 mg/dL   Total Protein 6.1 (L) 6.5 - 8.1 g/dL   Albumin 3.6 3.5 - 5.0 g/dL   AST 21 15 - 41 U/L   ALT 22 17 - 63 U/L   Alkaline Phosphatase 50 38 - 126 U/L   Total Bilirubin 0.4 0.3 - 1.2 mg/dL   GFR calc non Af Amer >60 >60 mL/min    GFR calc Af Amer >60 >60 mL/min    Comment: (NOTE) The eGFR has been calculated using the CKD EPI equation. This calculation has not been validated in all clinical situations. eGFR's persistently <60 mL/min signify possible Chronic Kidney Disease.    Anion gap 7 5 - 15    Comment: Performed at [facility], [address] [place], [place] [address]     Constitutional: No distress . Vital signs reviewed. HEENT: EOMI, oral membranes moist Neck: supple Cardiovascular: RRR without murmur. No JVD    Respiratory: CTA Bilaterally without wheezes or rales. Normal effort    GI: BS +, non-tender, non-distended  Skin:   Intact Neuro: Alert/Oriented, Abnormal Sensory intact light touch but has parasthesia LUE and LLE, Abnormal Motor 4- RUE and RLE, Abnormal FMC Ataxic/ dec FMC and speech dysarthric Musc/Skel:  Other no pain with UE or LE ROM Gen NAD, + dysmetria R FNF and R H->S   Assessment/Plan: 1. Functional deficits secondary   to Left pontine infarct with right hemiparesis which require 3+ hours per day of interdisciplinary therapy in a comprehensive inpatient rehab setting. Physiatrist is providing close team supervision and 24 hour management of active medical problems listed below. Physiatrist and rehab team continue to assess barriers to discharge/monitor patient progress toward functional and medical goals. FIM: Function - Bathing Position: Shower Body parts bathed by patient: Right arm, Left arm, Chest, Abdomen, Front perineal area, Buttocks, Right upper leg, Left upper leg, Right lower leg, Left lower leg Assist Level: Touching or steadying assistance(Pt > 75%)  Function- Upper Body Dressing/Undressing What is the patient wearing?: Pull over shirt/dress Pull over shirt/dress - Perfomed by patient: Thread/unthread right sleeve, Thread/unthread left sleeve, Put head through opening, Pull shirt over trunk Assist Level: Supervision or verbal cues Function - Lower Body  Dressing/Undressing What is the patient wearing?: Pants, Shoes Position: Wheelchair/chair at sink Pants- Performed by patient: Thread/unthread right pants leg, Thread/unthread left pants leg Pants- Performed by helper: Pull pants up/down Shoes - Performed by patient: Don/doff left shoe Shoes - Performed by helper: Don/doff right shoe, Fasten right, Fasten left Assist for footwear: Partial/moderate assist Assist for lower body dressing: Touching or steadying assistance (Pt > 75%)  Function - Toileting Toileting steps completed by patient: Adjust clothing after toileting, Adjust clothing prior to toileting Toileting steps completed by helper: Adjust clothing prior to toileting, Performs perineal hygiene, Adjust clothing after toileting  Function - Air cabin crew transfer assistive device: Grab bar, Walker Assist level to toilet: Touching or steadying assistance (Pt > 75%) Assist level from toilet: Touching or steadying assistance (Pt > 75%)  Function - Chair/bed transfer Chair/bed transfer method: Stand pivot, Ambulatory Chair/bed transfer assist level: Touching or steadying assistance (Pt > 75%) Chair/bed transfer assistive device: Armrests, Walker Chair/bed transfer details: Verbal cues for safe use of DME/AE, Verbal cues for precautions/safety, Verbal cues for technique  Function - Locomotion: Wheelchair Will patient use wheelchair at discharge?: (TBD) Type: Manual Max wheelchair distance: 200' Assist Level: Supervision or verbal cues Assist Level: Supervision or verbal cues Assist Level: Supervision or verbal cues Turns around,maneuvers to table,bed, and toilet,negotiates 3% grade,maneuvers on rugs and over doorsills: No Function - Locomotion: Ambulation Assistive device: Cane-straight Max distance: 150' Assist level: Touching or steadying assistance (Pt > 75%) Assist level: Touching or steadying assistance (Pt > 75%) Assist level: Touching or steadying assistance (Pt  > 75%) Assist level: Touching or steadying assistance (Pt > 75%) Walk 10 feet on uneven surfaces activity did not occur: Safety/medical concerns  Function - Comprehension Comprehension: Auditory Comprehension assist level: Follows complex conversation/direction with no assist  Function - Expression Expression: Verbal Expression assist level: Expresses complex 90% of the time/cues < 10% of the time  Function - Social Interaction Social Interaction assist level: Interacts appropriately with others with medication or extra time (anti-anxiety, antidepressant).  Function - Problem Solving Problem solving assist level: Solves complex problems: With extra time  Function - Memory Memory assist level: More than reasonable amount of time Patient normally able to recall (first 3 days only): Current season, Location of own room, That he or she is in a hospital, Staff names and faces  Medical Problem List and Plan: 1. Right hemiparesis, dysarthria, dysmetria, and gait disturbance secondary to Left pontine infarct   -continue PT, OT, SLP 2. DVT Prophylaxis/Anticoagulation: Pharmaceutical:Lovenox 3.DDD/Pain Management: Voltaren gel to neck/shoulders. Tylenol prn 4. Mood:Continue klonopin prn for anxiety.   -team to provide ego support as possible 5. Neuropsych: This patientiscapable of  making decisions on hisown behalf. 6. Skin/Wound Care:routine pressure relief measures 7. Fluids/Electrolytes/Nutrition:encourage PO 8. HTN: Permissive HTN for 24- 48 hours then gradual normalization.  Vitals:   08/22/17 2000 08/23/17 0620  BP: 136/80 (!) 141/91  Pulse: 82 87  Resp: 18 18  Temp: 98.1 F (36.7 C) 97.8 F (36.6 C)  SpO2: 97% 97%  DBP with elevation this morning. Observe for pattern   9. Dyslipidemia: now on Lipitor.  10. BPH?: continue Proscar and Flomax    LOS (Days) 3 A FACE TO FACE EVALUATION WAS PERFORMED  Zachary T Swartz 08/23/2017, 8:57 AM  

## 2017-08-23 NOTE — Plan of Care (Signed)
  Problem: Consults Goal: RH STROKE PATIENT EDUCATION Description See Patient Education module for education specifics  Outcome: Progressing   Problem: RH BOWEL ELIMINATION Goal: RH STG MANAGE BOWEL WITH ASSISTANCE Description STG Manage Bowel with Assistance. Outcome: Progressing Goal: RH STG MANAGE BOWEL W/MEDICATION W/ASSISTANCE Description STG Manage Bowel with Medication with Assistance. Outcome: Progressing   Problem: RH SKIN INTEGRITY Goal: RH STG SKIN FREE OF INFECTION/BREAKDOWN Outcome: Progressing   Problem: RH SAFETY Goal: RH STG ADHERE TO SAFETY PRECAUTIONS W/ASSISTANCE/DEVICE Description STG Adhere to Safety Precautions With Assistance/Device. Outcome: Progressing Goal: RH STG DEMO UNDERSTANDING HOME SAFETY PRECAUTIONS Outcome: Progressing Goal: RH OTHER STG SAFETY GOALS W/ASSIST Description Other STG Safety Goals With Assistance. Outcome: Progressing   Problem: RH PAIN MANAGEMENT Goal: RH STG PAIN MANAGED AT OR BELOW PT'S PAIN GOAL Outcome: Progressing   Problem: RH KNOWLEDGE DEFICIT Goal: RH STG INCREASE KNOWLEDGE OF HYPERTENSION Outcome: Progressing

## 2017-08-23 NOTE — Progress Notes (Signed)
Occupational Therapy Session Note  Patient Details  Name: Riley Hartman MRN: [id number] Date of Birth: 1961-03-08  Today's Date: 08/23/2017 OT Individual Time: 1000-1045 OT Individual Time Calculation (min): 45 min    Short Term Goals: Week 1:  OT Short Term Goal 1 (Week 1): STG=LTG 2/2 ELOS  Skilled Therapeutic Interventions/Progress Updates:    Pt seen for OT ADL bathing/dressing session. Pt sitting up in w/c upon arrival, denying pain and agreeable to tx session. He ambulated throughout session with min progressing to mod A with fatigue during longer distance ambulation. He bathed seated on tub bench with distant supervision, able to incorporate B UEs into functional task mod I. He dressed seated on tub bench requiring VCs for safety awareness and sitting to thread pants to reduce risk of fall. During dressing and other functional tasks, pt displays poor safety awareness and very poor awareness into deficits. Several LOB episodes requiring min-mod A to prevent total LOB and pt with decreased awareness of LOB episodes.  He completed grooming tasks standing at sink, alternating use of L UE vs R to complete task.  Following seated rest break, pt desiring to walk. He ambulated ~40 ft with min progressing to mod with increased knee hyperextension and decreased control, VCs to widen BOS during ambulation. Pt returned to w/c at end of session, left seated with chair alarm on and all needs in reach.  Education provided throughout session regarding OT/PT goals, rate of return, safety awareness, continuum of care, and d/c planning.    Therapy Documentation Precautions:  Precautions Precautions: Fall Restrictions Weight Bearing Restrictions: No ADL: ADL ADL Comments: Please see functional navigator  See Function Navigator for Current Functional Status.   Therapy/Group: Individual Therapy  Hazyl Marseille L 08/23/2017, 6:49 AM

## 2017-08-24 ENCOUNTER — Inpatient Hospital Stay (HOSPITAL_COMMUNITY): Payer: BLUE CROSS/BLUE SHIELD

## 2017-08-24 ENCOUNTER — Inpatient Hospital Stay (HOSPITAL_COMMUNITY): Payer: BLUE CROSS/BLUE SHIELD | Admitting: Occupational Therapy

## 2017-08-24 ENCOUNTER — Telehealth: Payer: Self-pay | Admitting: Family Medicine

## 2017-08-24 NOTE — Plan of Care (Signed)
  Problem: Consults Goal: RH STROKE PATIENT EDUCATION Description See Patient Education module for education specifics  Outcome: Progressing   Problem: RH BOWEL ELIMINATION Goal: RH STG MANAGE BOWEL WITH ASSISTANCE Description STG Manage Bowel with mod I Assistance.  Outcome: Progressing Goal: RH STG MANAGE BOWEL W/MEDICATION W/ASSISTANCE Description STG Manage Bowel with Medication with mod I  Assistance.  Outcome: Progressing   Problem: RH SKIN INTEGRITY Goal: RH STG SKIN FREE OF INFECTION/BREAKDOWN Description Patients skin will remain free from further infection or breakdown with mod I assist.  Outcome: Progressing   Problem: RH SAFETY Goal: RH STG ADHERE TO SAFETY PRECAUTIONS W/ASSISTANCE/DEVICE Description STG Adhere to Safety Precautions With mod I Assistance/Device.  Outcome: Progressing Goal: RH STG DEMO UNDERSTANDING HOME SAFETY PRECAUTIONS Outcome: Progressing Goal: RH OTHER STG SAFETY GOALS W/ASSIST Description Other STG Safety Goals With Assistance. Outcome: Progressing   Problem: RH PAIN MANAGEMENT Goal: RH STG PAIN MANAGED AT OR BELOW PT'S PAIN GOAL Description < 3  Outcome: Progressing   Problem: RH KNOWLEDGE DEFICIT Goal: RH STG INCREASE KNOWLEDGE OF HYPERTENSION Description With mod I assist.  Outcome: Progressing

## 2017-08-24 NOTE — Progress Notes (Signed)
Subjective/Complaints:  Pt was supposed to get some therapy for neck pain, shoulder pain, we discussed CVA related deficits are main priority Reviewed recent CT neck, bone views showed uncal hypertrophy C2-3, disc space narrowing C4-5 and C6-7, central canal appears open (no stenotic areas) ROS: Patient denies  nausea, vomiting, diarrhea, cough, shortness of breath or chest pain, joint or back pain, headache, or mood change.    Objective: Vital Signs: Blood pressure (!) 146/83, pulse 78, temperature 97.6 F (36.4 C), temperature source Oral, resp. rate 18, SpO2 96 %. No results found. No results found for this or any previous visit (from the past 72 hour(s)).   Constitutional: No distress . Vital signs reviewed. HEENT: EOMI, oral membranes moist Neck: supple Cardiovascular: RRR without murmur. No JVD    Respiratory: CTA Bilaterally without wheezes or rales. Normal effort    GI: BS +, non-tender, non-distended  Skin:   Intact Neuro: Alert/Oriented, Abnormal Sensory intact light touch but has parasthesia LUE and LLE, Abnormal Motor 4- RUE and RLE, Abnormal FMC Ataxic/ dec FMC and speech dysarthric Musc/Skel:  Other no pain with UE or LE ROM Gen NAD, + dysmetria R FNF and R H->S   Assessment/Plan: 1. Functional deficits secondary to Left pontine infarct with right hemiparesis which require 3+ hours per day of interdisciplinary therapy in a comprehensive inpatient rehab setting. Physiatrist is providing close team supervision and 24 hour management of active medical problems listed below. Physiatrist and rehab team continue to assess barriers to discharge/monitor patient progress toward functional and medical goals. FIM: Function - Bathing Position: Shower Body parts bathed by patient: Right arm, Left arm, Chest, Abdomen, Front perineal area, Buttocks, Right upper leg, Left upper leg, Right lower leg, Left lower leg, Back Assist Level: Supervision or verbal cues  Function- Upper Body  Dressing/Undressing What is the patient wearing?: Pull over shirt/dress Pull over shirt/dress - Perfomed by patient: Thread/unthread right sleeve, Thread/unthread left sleeve, Put head through opening, Pull shirt over trunk Assist Level: More than reasonable time Function - Lower Body Dressing/Undressing What is the patient wearing?: Pants, Shoes, Socks Position: Wheelchair/chair at sink Pants- Performed by patient: Thread/unthread right pants leg, Thread/unthread left pants leg, Pull pants up/down Pants- Performed by helper: Pull pants up/down Socks - Performed by patient: Don/doff right sock, Don/doff left sock Shoes - Performed by patient: Don/doff right shoe, Don/doff left shoe Shoes - Performed by helper: Don/doff right shoe, Fasten right, Fasten left Assist for footwear: Supervision/touching assist Assist for lower body dressing: Touching or steadying assistance (Pt > 75%)  Function - Toileting Toileting steps completed by patient: Adjust clothing after toileting, Adjust clothing prior to toileting Toileting steps completed by helper: Adjust clothing prior to toileting, Performs perineal hygiene, Adjust clothing after toileting  Function - Air cabin crew transfer assistive device: Grab bar, Walker Assist level to toilet: Touching or steadying assistance (Pt > 75%) Assist level from toilet: Touching or steadying assistance (Pt > 75%)  Function - Chair/bed transfer Chair/bed transfer method: Stand pivot, Ambulatory Chair/bed transfer assist level: Touching or steadying assistance (Pt > 75%) Chair/bed transfer assistive device: Armrests, Walker Chair/bed transfer details: Verbal cues for safe use of DME/AE, Verbal cues for precautions/safety, Verbal cues for technique  Function - Locomotion: Wheelchair Will patient use wheelchair at discharge?: (TBD) Type: Manual Max wheelchair distance: 200' Assist Level: Supervision or verbal cues Assist Level: Supervision or verbal  cues Assist Level: Supervision or verbal cues Turns around,maneuvers to table,bed, and toilet,negotiates 3% grade,maneuvers on rugs and over  doorsills: No Function - Locomotion: Ambulation Assistive device: Cane-straight Max distance: 150' Assist level: Touching or steadying assistance (Pt > 75%) Assist level: Touching or steadying assistance (Pt > 75%) Assist level: Touching or steadying assistance (Pt > 75%) Assist level: Touching or steadying assistance (Pt > 75%) Walk 10 feet on uneven surfaces activity did not occur: Safety/medical concerns  Function - Comprehension Comprehension: Auditory Comprehension assist level: Follows complex conversation/direction with no assist  Function - Expression Expression: Verbal Expression assist level: Expresses complex 90% of the time/cues < 10% of the time  Function - Social Interaction Social Interaction assist level: Interacts appropriately with others with medication or extra time (anti-anxiety, antidepressant).  Function - Problem Solving Problem solving assist level: Solves complex problems: With extra time  Function - Memory Memory assist level: More than reasonable amount of time Patient normally able to recall (first 3 days only): Current season, Location of own room, That he or she is in a hospital, Staff names and faces  Medical Problem List and Plan: 1. Right hemiparesis, dysarthria, dysmetria, and gait disturbance secondary to Left pontine infarct   -continue PT, OT, SLP 2. DVT Prophylaxis/Anticoagulation: Pharmaceutical:Lovenox 3.DDD/Pain Management: Voltaren gel to neck/shoulders. Tylenol prn, cervicalgia due to DDD,may incorporate some treatment in PT/OT but as discussed with pt CVA deficits are more functionally relevant 4. Mood:Continue klonopin prn for anxiety.   -team to provide ego support as possible 5. Neuropsych: This patientiscapable of making decisions on hisown behalf. 6. Skin/Wound Care:routine  pressure relief measures 7. Fluids/Electrolytes/Nutrition:encourage PO 8. HTN:  Vitals:   08/23/17 2012 08/24/17 0517  BP: (!) 138/93 (!) 146/83  Pulse: 74 78  Resp: 16 18  Temp: 98.4 F (36.9 C) 97.6 F (36.4 C)  SpO2: 96% 96%  SBP with elevation this morning. Observe for pattern  Before adjustments       9. Dyslipidemia: now on Lipitor.  10. BPH?: continue Proscar and Flomax    LOS (Days) 4 A FACE TO FACE EVALUATION WAS PERFORMED  Charlett Blake 08/24/2017, 8:18 AM

## 2017-08-24 NOTE — Telephone Encounter (Signed)
Copied from [place] [id number]. Topic: Inquiry >> Aug 24, 2017  7:49 AM Pricilla Handler wrote: Reason for CRM: Patient called this morning from the hospital. The patient has had a stroke, and he cannot make his therapy appts today that Dr. [name] referred him to. Patient wants to know if Dr. [name] would order patient's therapy to take place in the hospital at [facility] as he will be there for the next two weeks. Please call patient at [phone] his cell phone. Patient is in [phone] at [facility].       Thank You!!!

## 2017-08-24 NOTE — Progress Notes (Signed)
Occupational Therapy Session Note  Patient Details  Name: Riley Hartman MRN: [id number] Date of Birth: 12/17/1960  Today's Date: 08/24/2017 OT Individual Time: 1030-1140 OT Individual Time Calculation (min): 70 min    Short Term Goals: Week 1:  OT Short Term Goal 1 (Week 1): STG=LTG 2/2 ELOS  Skilled Therapeutic Interventions/Progress Updates:    Pt greeted sitting in wc and agreeable to OT treatment session.  Pt declined any bathing/dressing tasks. B UE strength/coordination with wc propulsion to therapy gym.  Treatment provided to address functional use of R UE. Pt brought into quadruped position for neuro re-ed while completing card matching game. Graded peg board activity focused on pinch, in-hand manipulation, and wrist pronation/supination. Pinch strength and standing balance with standing clothes pin task. Pt needed min guard A for balance when standing to place clothes pins. Provided pt with handout for hand exercises using red thera-putty, finger isolation exercises, pincer grasp, flat pinch, and full grasp. Pt returned to room and left seated in wc with chair alarm on and needs met.   Therapy Documentation Precautions:  Precautions Precautions: Fall Restrictions Weight Bearing Restrictions: No Pain: Pain Assessment Pain Scale: 0-10 Pain Score: 0-No pain ADL: ADL ADL Comments: Please see functional navigator  See Function Navigator for Current Functional Status.   Therapy/Group: Individual Therapy  Elisabeth S Doe 08/24/2017, 11:21 AM 

## 2017-08-24 NOTE — Progress Notes (Signed)
Social Work Patient ID: Riley Hartman, male   DOB: [birth year], 57 y.o.   MRN: [id number] Riley Hartman pt his STD paperwork back to give to his employer. He will get it to them. He feels he is doing well in his therapies, hopes to go home soon.

## 2017-08-24 NOTE — Telephone Encounter (Signed)
Please advise 

## 2017-08-24 NOTE — Progress Notes (Signed)
Physical Therapy Session Note  Patient Details  Name: Riley Hartman MRN: [id number] Date of Birth: 13-May-1960  Today's Date: 08/24/2017 PT Individual Time: 2836-6294 AND 1335-1445 PT Individual Time Calculation (min): 58 min AND 70 min   Short Term Goals: Week 1:  PT Short Term Goal 1 (Week 1): Pt will ambulate 150' w/ supervision using LRAD PT Short Term Goal 2 (Week 1): Pt will maintain dynamic standing balance w/ supervision  Skilled Therapeutic Interventions/Progress Updates:    Session 1: Pt seated in w/c upon PT arrival, agreeable to therapy tx and denies pain. Pt ambulated from room>gym x 200 ft with RW and min assist, occasional verbal cues for R foot clearance and knee control. Pt worked on dynamic standing balance and coordination this session without UE support standing on airex to toss ball and toe taps to target on 6 inch step. Pt worked on dynamic balance to perform gait forwards/backwards 10 ft in each direction x 2 with mod assist, no AD. Pt performed lateral step ups on 4 inch step with UE support, emphasis on LE neuro re-ed and knee control, 2 x 10. Pt performed x 10 sit<>stands with emphasis on symmetric weightbearing, x 10 with added theraband for hip abduction, all for LE neuro re-ed. Pt worked on Dietitian and coordination to performateral steps over object x 10 in each direction, min-mod assist without UE support. Pt performed sidestepping 2 x 10 ft in each direction with mod assist working on strength and coordination. Pt ambulated back to room with RW and min assist, verbal cues for step length and foot placement, tactile cues to limit knee hyperextension. Pt left seated in w/c at end of session with chair alarm set and needs in reach.   Session 2: Pt seated in w/c in dayroom upon PT arrival, agreeable to therapy tx and denies pain. Pt propelled w/c to the gym with B UEs and supervision. Pt transferred from w/c>tall kneeling on mat with min assist. In tall kneeling pt  performed B UE raises 2 x 10 with 1kg weighted ball and 2 x 10 diagonals in each directions, working on hip strengthening and NMR along with UE strength coordination, manual facilitation and cues for increased hip extension. Pt transferred to sitting and then supine with supervision, in supine pt performed B LE stretches for ROM including knee to chest stretch, piriformis stretch, figure four stretch and hamstring stretch, 2 x 30 sec each. Pt transferred to quadruped with min assist, performed alternating hip extension with each LE x 10 for NMR and hip strengthening. Pt performed 2 x 10 bridges and x 10 R single leg bridges for NMR and hip extensor strengthening. Pt worked on dynamic standing balance and coordination to perform toe taps to colored dots, x 2 trials with min assist. Pt ambulated x 85 ft with RW and verbal cues for R foot clearance, min assist. Pt ambulated back to room x 200 ft with RW and min assist, verbal cues for R heel strike, manual facilitation for R anteriolateral weightshift during stance. Pt left seated in w/c at end of session with needs in reach and chair alarm set.   Therapy Documentation Precautions:  Precautions Precautions: Fall Restrictions Weight Bearing Restrictions: No   See Function Navigator for Current Functional Status.   Therapy/Group: Individual Therapy  Netta Corrigan, PT, DPT 08/24/2017, 7:46 AM

## 2017-08-24 NOTE — Telephone Encounter (Signed)
I cannot order anything in the hospital. This is up to his doctor's there

## 2017-08-24 NOTE — Progress Notes (Addendum)
Speech Language Pathology Discharge Summary  Patient Details  Name: Riley Hartman MRN: [id number] Date of Birth: January 13, 1961  Today's Date: 08/24/2017 SLP Individual Time: 2355-7322 SLP Individual Time Calculation (min): 28 min   Skilled Therapeutic Interventions:   Skilled ST services focused on speech skills. SLP facilitated speech intelligibly skills in functional conversation about medical changes from stroke and it's impact on pt's life upon discharge. Pt recalled speech intelligibility strategies and demonstrated use of speech intelligiblility strategies in conversation at Mod I with 90% intelligibility. Pt demonstrated ability to correct slurred speech in conversation at Mod I. Pt agreed to mod I use of speech intelligibility strategies and that skilled St services are no longer needed at this point. SLP reviewed speech intelligibility strategies mainly focusing on overarticulation, pt demonstrated use of strategies. Pt was left in room with call bell within reach. Recommend to continue skilled ST services.     Patient has met 1 of 1 long term goals.  Patient to discharge at overall Modified Independent level.  Reasons goals not met:     Clinical Impression/Discharge Summary:   Pt demonstrated great progress meeting 1 out 1 goal, discharging Mod I level for use of speech intelligibility strategies in conversation. Pt demonstrated independent use of speech intelligibility strategies, especially, overarticulation, resulting in 90% intelligibility in conversation. Pt benefited from skilled ST service sin order to maximize functional independence and reduce burden of care requiring no further skilled ST services.   Care Partner:  Caregiver Able to Provide Assistance: Yes  Type of Caregiver Assistance: Cognitive  Recommendation:  None      Equipment:   N/A  Reasons for discharge: Treatment goals met   Patient/Family Agrees with Progress Made and Goals Achieved: Yes    Function:  Eating Eating                 Cognition Comprehension Comprehension assist level: Follows complex conversation/direction with no assist  Expression   Expression assist level: Expresses complex ideas: With no assist  Social Interaction Social Interaction assist level: Interacts appropriately with others with medication or extra time (anti-anxiety, antidepressant).  Problem Solving Problem solving assist level: Solves complex problems: With extra time  Memory Memory assist level: More than reasonable amount of time   Zarinah Oviatt  [facility] 08/24/2017, 1:42 PM

## 2017-08-25 ENCOUNTER — Inpatient Hospital Stay (HOSPITAL_COMMUNITY): Payer: BLUE CROSS/BLUE SHIELD | Admitting: Physical Therapy

## 2017-08-25 ENCOUNTER — Inpatient Hospital Stay (HOSPITAL_COMMUNITY): Payer: BLUE CROSS/BLUE SHIELD | Admitting: Occupational Therapy

## 2017-08-25 ENCOUNTER — Encounter (HOSPITAL_COMMUNITY): Payer: BLUE CROSS/BLUE SHIELD | Admitting: Psychology

## 2017-08-25 ENCOUNTER — Inpatient Hospital Stay (HOSPITAL_COMMUNITY): Payer: BLUE CROSS/BLUE SHIELD

## 2017-08-25 MED ORDER — GABAPENTIN 100 MG PO CAPS
100.0000 mg | ORAL_CAPSULE | Freq: Two times a day (BID) | ORAL | Status: DC
  Administered 2017-08-25 – 2017-08-27 (×5): 100 mg via ORAL
  Filled 2017-08-25 (×5): qty 1

## 2017-08-25 MED ORDER — LOPERAMIDE HCL 2 MG PO CAPS
4.0000 mg | ORAL_CAPSULE | Freq: Once | ORAL | Status: AC
  Administered 2017-08-25: 4 mg via ORAL
  Filled 2017-08-25: qty 2

## 2017-08-25 NOTE — Telephone Encounter (Signed)
Called and spoke to patient. He states that he has already worked it out with the doctors at the hospital. Nothing further needed at this time.

## 2017-08-25 NOTE — Progress Notes (Signed)
Occupational Therapy Session Note  Patient Details  Name: KAELEB EMOND MRN: [id number] Date of Birth: 21-Sep-1960  Today's Date: 08/25/2017 OT Individual Time: 1300-1415 OT Individual Time Calculation (min): 75 min   Short Term Goals: Week 1:  OT Short Term Goal 1 (Week 1): STG=LTG 2/2 ELOS  Skilled Therapeutic Interventions/Progress Updates:    Pt greeted sitting in wc, declined bathing/dressing this session.  B UE strength/coordination with wc propulsion to therapy gym. Stand-pivot wc to therapy mat with supervision. R hand fine motor coordination with graded beading task. Focus on in-hand manipulation, translation, and rotation of beads. Progressed to holding 2,3,4, and 5 beads and translating from palm to finger tips. Pt given handout for fine motor exercises and reviewed each exercise with pt. Practiced smoothness and control with hand writing activity. Pt then reported need for bathroom so brought to nearest bathroom and completed stand-pivot to toilet with min A and min guard A for balance when managing clothing. Pt with successful BM and completed peri-care without assistance. Ambulated 5 feet to sink with min A, washed hands, then returned to room in wc. Pt left seated in wc with chair alarm on and needs met.   Therapy Documentation Precautions:  Precautions Precautions: Fall Restrictions Weight Bearing Restrictions: No Pain:  none/denies pain ADL: ADL ADL Comments: Please see functional navigator  See Function Navigator for Current Functional Status.   Therapy/Group: Individual Therapy  Valma Cava 08/25/2017, 1:27 PM

## 2017-08-25 NOTE — Plan of Care (Signed)
  Problem: Consults Goal: RH STROKE PATIENT EDUCATION Description See Patient Education module for education specifics  Outcome: Progressing   Problem: RH BOWEL ELIMINATION Goal: RH STG MANAGE BOWEL WITH ASSISTANCE Description STG Manage Bowel with mod I Assistance.  Outcome: Progressing Goal: RH STG MANAGE BOWEL W/MEDICATION W/ASSISTANCE Description STG Manage Bowel with Medication with mod I  Assistance.  Outcome: Progressing   Problem: RH SKIN INTEGRITY Goal: RH STG SKIN FREE OF INFECTION/BREAKDOWN Description Patients skin will remain free from further infection or breakdown with mod I assist.  Outcome: Progressing   Problem: RH SAFETY Goal: RH STG ADHERE TO SAFETY PRECAUTIONS W/ASSISTANCE/DEVICE Description STG Adhere to Safety Precautions With mod I Assistance/Device.  Outcome: Progressing Goal: RH STG DEMO UNDERSTANDING HOME SAFETY PRECAUTIONS Outcome: Progressing Goal: RH OTHER STG SAFETY GOALS W/ASSIST Description Other STG Safety Goals With Assistance. Outcome: Progressing   Problem: RH PAIN MANAGEMENT Goal: RH STG PAIN MANAGED AT OR BELOW PT'S PAIN GOAL Description < 3  Outcome: Progressing   Problem: RH KNOWLEDGE DEFICIT Goal: RH STG INCREASE KNOWLEDGE OF HYPERTENSION Description With mod I assist.  Outcome: Progressing

## 2017-08-25 NOTE — Progress Notes (Addendum)
Subjective/Complaints:  Tingling pain Right side back f head and neck area asking for a nerve pain med  ROS: Patient denies  ough, shortness of breath or chest pain, joint or back pain, headache, or mood change.    Objective: Vital Signs: Blood pressure 139/87, pulse 65, temperature 97.9 F (36.6 C), temperature source Oral, resp. rate 18, SpO2 99 %. No results found. No results found for this or any previous visit (from the past 72 hour(s)).   Constitutional: No distress . Vital signs reviewed. HEENT: EOMI, oral membranes moist Neck: supple Cardiovascular: RRR without murmur. No JVD    Respiratory: CTA Bilaterally without wheezes or rales. Normal effort    GI: BS +, non-tender, non-distended  Skin:   Intact Neuro: Alert/Oriented, Abnormal Sensory intact light touch but has parasthesia LUE and LLE,Parasthesia Right occipital area as well  Abnormal Motor 4- RUE and RLE, Abnormal FMC Ataxic/ dec FMC and speech dysarthric Musc/Skel:  Other no pain with UE or LE ROM Gen NAD, + dysmetria R FNF and R H->S   Assessment/Plan: 1. Functional deficits secondary to Left pontine infarct with right hemiparesis which require 3+ hours per day of interdisciplinary therapy in a comprehensive inpatient rehab setting. Physiatrist is providing close team supervision and 24 hour management of active medical problems listed below. Physiatrist and rehab team continue to assess barriers to discharge/monitor patient progress toward functional and medical goals. FIM: Function - Bathing Position: Shower Body parts bathed by patient: Right arm, Left arm, Chest, Abdomen, Front perineal area, Buttocks, Right upper leg, Left upper leg, Right lower leg, Left lower leg, Back Assist Level: Supervision or verbal cues  Function- Upper Body Dressing/Undressing What is the patient wearing?: Pull over shirt/dress Pull over shirt/dress - Perfomed by patient: Thread/unthread right sleeve, Thread/unthread left sleeve,  Put head through opening, Pull shirt over trunk Assist Level: More than reasonable time Function - Lower Body Dressing/Undressing What is the patient wearing?: Pants, Shoes, Socks Position: Wheelchair/chair at sink Pants- Performed by patient: Thread/unthread right pants leg, Thread/unthread left pants leg, Pull pants up/down Pants- Performed by helper: Pull pants up/down Socks - Performed by patient: Don/doff right sock, Don/doff left sock Shoes - Performed by patient: Don/doff right shoe, Don/doff left shoe Shoes - Performed by helper: Don/doff right shoe, Fasten right, Fasten left Assist for footwear: Supervision/touching assist Assist for lower body dressing: Touching or steadying assistance (Pt > 75%)  Function - Toileting Toileting steps completed by patient: Adjust clothing prior to toileting, Performs perineal hygiene, Adjust clothing after toileting Toileting steps completed by helper: Adjust clothing prior to toileting, Performs perineal hygiene, Adjust clothing after toileting Assist level: Supervision or verbal cues  Function Midwife transfer assistive device: Grab bar, Walker Assist level to toilet: Touching or steadying assistance (Pt > 75%) Assist level from toilet: Touching or steadying assistance (Pt > 75%)  Function - Chair/bed transfer Chair/bed transfer method: Stand pivot, Ambulatory Chair/bed transfer assist level: Touching or steadying assistance (Pt > 75%) Chair/bed transfer assistive device: Armrests, Walker Chair/bed transfer details: Verbal cues for safe use of DME/AE, Verbal cues for precautions/safety, Verbal cues for technique  Function - Locomotion: Wheelchair Will patient use wheelchair at discharge?: (TBD) Type: Manual Max wheelchair distance: 200' Assist Level: Supervision or verbal cues Assist Level: Supervision or verbal cues Assist Level: Supervision or verbal cues Turns around,maneuvers to table,bed, and toilet,negotiates 3%  grade,maneuvers on rugs and over doorsills: No Function - Locomotion: Ambulation Assistive device: Walker-rolling Max distance: 200 ft Assist level:  Touching or steadying assistance (Pt > 75%) Assist level: Touching or steadying assistance (Pt > 75%) Assist level: Touching or steadying assistance (Pt > 75%) Assist level: Touching or steadying assistance (Pt > 75%) Walk 10 feet on uneven surfaces activity did not occur: Safety/medical concerns  Function - Comprehension Comprehension: Auditory Comprehension assist level: Follows complex conversation/direction with no assist  Function - Expression Expression: Verbal Expression assist level: Expresses complex ideas: With no assist  Function - Social Interaction Social Interaction assist level: Interacts appropriately with others with medication or extra time (anti-anxiety, antidepressant).  Function - Problem Solving Problem solving assist level: Solves complex problems: With extra time  Function - Memory Memory assist level: More than reasonable amount of time Patient normally able to recall (first 3 days only): Current season, Location of own room, That he or she is in a hospital, Staff names and faces  Medical Problem List and Plan: 1. Right hemiparesis, dysarthria, dysmetria, and gait disturbance secondary to Left pontine infarct   -continue PT, OT, SLP team conf in am 2. DVT Prophylaxis/Anticoagulation: Pharmaceutical:Lovenox 3.DDD/Pain Management: Voltaren gel to neck/shoulders. Tylenol prn, cervicalgia due to DDD,may incorporate some treatment in PT/OT but as discussed with pt CVA deficits are more functionally relevant CVA  Add gabapentin 4. Mood:Continue klonopin prn for anxiety.   -team to provide ego support as possible 5. Neuropsych: This patientiscapable of making decisions on hisown behalf. 6. Skin/Wound Care:routine pressure relief measures 7. Fluids/Electrolytes/Nutrition:encourage PO 8. HTN:  Vitals:    08/24/17 2106 08/25/17 0459  BP: (!) 156/92 139/87  Pulse: 66 65  Resp: 17 18  Temp: 98 F (36.7 C) 97.9 F (36.6 C)  SpO2: 94% 99%  SBP with elevation this morning. Observe for pattern  Before adjustments       9. Dyslipidemia: now on Lipitor.  10. BPH?: continue Proscar and Flomax    LOS (Days) 5 A FACE TO FACE EVALUATION WAS PERFORMED  Charlett Blake 08/25/2017, 8:59 AM

## 2017-08-25 NOTE — Progress Notes (Signed)
Physical Therapy Session Note  Patient Details  Name: Riley Hartman MRN: [id number] Date of Birth: 1961-01-07  Today's Date: 08/25/2017 PT Individual Time: 1615-1700 PT Individual Time Calculation (min): 45 min  and Today's Date: 08/25/2017 PT Missed Time: 15 Minutes Missed Time Reason: Other (Comment)(with neuropsychologist)  Short Term Goals: Week 1:  PT Short Term Goal 1 (Week 1): Pt will ambulate 150' w/ supervision using LRAD PT Short Term Goal 2 (Week 1): Pt will maintain dynamic standing balance w/ supervision  Skilled Therapeutic Interventions/Progress Updates:    Pt missed 15 minutes of skilled therapy tx for discussion with neuropsychologist. Pt ambulated from room>gym x 200 ft with RW and min assist, occasional verbal cues for RW management. Pt worked on dynamic standing balance and coordination without UE support to perform toe taps on 4 inch step to target, sidestepping, forward/backward stepping without AD, min assist. Pt reports needing to use bathroom, ambulated to bathroom in hallway with RW. Pt performed all toileting with supervision. Pt ambulated back to gym. Pt ascended/descended 12 steps with B handrails and step to pattern, min assist. Pt ambulated back to room x 200 ft with RW and supervision. Left seated in w/c with needs in reach and chair alarm set.   Therapy Documentation Precautions:  Precautions Precautions: Fall Restrictions Weight Bearing Restrictions: No   See Function Navigator for Current Functional Status.   Therapy/Group: Individual Therapy  Netta Corrigan, PT, DPT 08/25/2017, 4:14 PM

## 2017-08-25 NOTE — Progress Notes (Signed)
Physical Therapy Session Note  Patient Details  Name: Riley Hartman MRN: [id number] Date of Birth: [birth year]  Today's Date: 08/25/2017 PT Individual Time: 0900-0955 PT Individual Time Calculation (min): 55 min   Short Term Goals: Week 1:  PT Short Term Goal 1 (Week 1): Pt will ambulate 150' w/ supervision using LRAD PT Short Term Goal 2 (Week 1): Pt will maintain dynamic standing balance w/ supervision  Skilled Therapeutic Interventions/Progress Updates:   Pt in w/c and agreeable to therapy, denies pain. Session focused on increasing RLE proprioception and LE NMR. Ambulated around unit during session between activities w/ min guard using RW w/ verbal cues for gait pattern and occasional verbal cues for obstacle avoidance on R side. Performed RLE step-ups to 3" step w/ emphasis on knee control. Performed limits of stability training on biodex. 68% and 69% w/ BUE support, 50% w/ LUE support, and 31% w/o UE support. Close supervision for safety and verbal cues for ankle balance strategies. Performed NuStep @ level 5, 5 min x2 bouts to work on reciprocal movement pattern. Emphasis on neutral R knee alignment and increased fluidity of movement. Discussed strategies to relieve R neck pain during rest breaks. Educated on facilitating neck movement when he has neutral posture. Scapular retraction and chin tuck relieves pain that he gets w/ neck rotation. Returned to room and ended session in w/c, call bell within reach and all needs met. Chair alarm activated.   Therapy Documentation Precautions:  Precautions Precautions: Fall Restrictions Weight Bearing Restrictions: No  See Function Navigator for Current Functional Status.   Therapy/Group: Individual Therapy  Lizzie Cokley K Arnette 08/25/2017, 10:00 AM

## 2017-08-26 ENCOUNTER — Inpatient Hospital Stay (HOSPITAL_COMMUNITY): Payer: BLUE CROSS/BLUE SHIELD

## 2017-08-26 ENCOUNTER — Inpatient Hospital Stay (HOSPITAL_COMMUNITY): Payer: BLUE CROSS/BLUE SHIELD | Admitting: Occupational Therapy

## 2017-08-26 LAB — CBC
HCT: 45.4 % (ref 39.0–52.0)
Hemoglobin: 14.7 g/dL (ref 13.0–17.0)
MCH: 29.2 pg (ref 26.0–34.0)
MCHC: 32.4 g/dL (ref 30.0–36.0)
MCV: 90.1 fL (ref 78.0–100.0)
Platelets: 199 10*3/uL (ref 150–400)
RBC: 5.04 MIL/uL (ref 4.22–5.81)
RDW: 12.9 % (ref 11.5–15.5)
WBC: 10.6 10*3/uL — ABNORMAL HIGH (ref 4.0–10.5)

## 2017-08-26 LAB — BASIC METABOLIC PANEL
Anion gap: 11 (ref 5–15)
BUN: 17 mg/dL (ref 6–20)
CO2: 25 mmol/L (ref 22–32)
Calcium: 9.1 mg/dL (ref 8.9–10.3)
Chloride: 104 mmol/L (ref 101–111)
Creatinine, Ser: 1.01 mg/dL (ref 0.61–1.24)
GFR calc Af Amer: >60 mL/min (ref >60)
GFR calc non Af Amer: >60 mL/min (ref >60)
Glucose, Bld: 76 mg/dL (ref 65–99)
Potassium: 3.6 mmol/L (ref 3.5–5.1)
Sodium: 140 mmol/L (ref 135–145)

## 2017-08-26 NOTE — Patient Care Conference (Signed)
Inpatient RehabilitationTeam Conference and Plan of Care Update Date: 08/26/2017   Time: 11:00 AM    Patient Name: Riley Hartman      Medical Record Number: [id number]  Date of Birth: [birth year] Sex: Male         Room/Bed: 4W26C/4W26C-01 Payor Info: Payor: [place] / Plan: [employer] OTHER / Product Type: *No Product type* /    Admitting Diagnosis: [place] CVA  Admit Date/Time:  08/20/2017  5:10 PM Admission Comments: No comment available   Primary Diagnosis:  Cerebral thrombosis with cerebral infarction [facility]) Principal Problem: Cerebral thrombosis with cerebral infarction [facility])  Patient Active Problem List   Diagnosis Date Noted  . 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 Gait disturbance, post-stroke 08/20/2017  . Right hemiparesis ([place])   . Cerebral thrombosis with cerebral infarction 08/19/2017  . Chronic pain syndrome   . Peripheral neuropathy   . Diastolic dysfunction   . Stroke-like symptoms 08/18/2017  . CAD (coronary artery disease) 08/18/2017  . HTN (hypertension) 08/18/2017  . HLD (hyperlipidemia) 08/18/2017  . ADHD 08/18/2017  . Tobacco abuse 08/18/2017  . Anxiety disorder 08/18/2017  . CAD 05/14/2011  . ADHD 04/17/2010  . 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 HYPERGLYCEMIA 01/31/2009  . ANXIETY STATE, UNSPECIFIED 11/17/2008  . BACK PAIN, LUMBAR 10/02/2008  . DEGENERATIVE JOINT DISEASE 08/02/2008  . ALLERGIC RHINITIS 08/30/2007  . CARPAL TUNNEL SYNDROME 08/13/2007  . SLEEP APNEA 08/13/2007  . HYPERLIPIDEMIA 12/21/2006  . HYPERTENSION 12/21/2006    Expected Discharge Date: Expected Discharge Date: 08/29/17  Team Members Present: Physician leading conference: Dr. Alysia Penna Social Worker Present: Ovidio Kin, LCSW Nurse Present: Other (comment)(Mekides Nida-RN) PT Present: Michaelene Song, PT OT Present: Cherylynn Ridges, OT SLP Present: Weston Anna, SLP PPS Coordinator present : Daiva Nakayama, RN, CRRN     Current  Status/Progress Goal Weekly Team Focus  Medical   Patient participating in therapy, blood pressure still running high at times.  CBC and be met are stable with exception of borderline elevated WBC but afebrile  Maintain medical stability  Improve balance, establish discharge environment   Bowel/Bladder   Continent of bowel and bladder  Maintain continence  Assist as needed   Swallow/Nutrition/ Hydration             ADL's   Min A overall  mostly mod i, may downgrade to supervision 2/2 impulsiveness   R UE NMR, pt/family education, modified bathing/dressing, ataxia   Mobility   min assist to close supervision overall, ambulating community distances w/ RW  supervision to Mod I   RLE NMR, gait w/ LRAD, education and discharge planning    Communication             Safety/Cognition/ Behavioral Observations            Pain   C/o neck pain/stiffness  Pain < 3  Assess Qshift and PRN offer PRNs when appropriate   Skin   Skin intact  Prevent breakdown/infection  Assist qshift and prn      *See Care Plan and progress notes for long and short-term goals.     Barriers to Discharge  Current Status/Progress Possible Resolutions Date Resolved   Physician    Home environment access/layout     Progressing towards goals  Patient may discharge to a friend's home for better accessibility      Nursing                  PT  Decreased caregiver  support  Wife works during the day, but planning to take off time from work at beginning to stay home 24/7              OT                  SLP                SW                Discharge Planning/Teaching Needs:  Home with wife who is there in the evenings and weekends and friend who will be there during the day. Pt very impulsive and tends to do what he wants      Team Discussion:  Goals supervision-mod/i level due to balance/safety issues. At times he is impulsive and moves too quickly. Speech has DC due to met his goals. DC voltaren gel refuses  to use. Medically stable for DC Sat. Neuro-psych seeing for coping. Pain being managed.  Revisions to Treatment Plan:  DC 5/18-Sat    Continued Need for Acute Rehabilitation Level of Care: The patient requires daily medical management by a physician with specialized training in physical medicine and rehabilitation for the following conditions: Daily direction of a multidisciplinary physical rehabilitation program to ensure safe treatment while eliciting the highest outcome that is of practical value to the patient.: Yes Daily medical management of patient stability for increased activity during participation in an intensive rehabilitation regime.: Yes Daily analysis of laboratory values and/or radiology reports with any subsequent need for medication adjustment of medical intervention for : Neurological problems;Blood pressure problems  [name], [name] 08/26/2017, 12:35 PM

## 2017-08-26 NOTE — Plan of Care (Signed)
  Problem: Consults Goal: RH STROKE PATIENT EDUCATION Description See Patient Education module for education specifics  Outcome: Progressing   Problem: RH BOWEL ELIMINATION Goal: RH STG MANAGE BOWEL WITH ASSISTANCE Description STG Manage Bowel with mod I Assistance.  Outcome: Progressing Goal: RH STG MANAGE BOWEL W/MEDICATION W/ASSISTANCE Description STG Manage Bowel with Medication with mod I  Assistance.  Outcome: Progressing   Problem: RH SKIN INTEGRITY Goal: RH STG SKIN FREE OF INFECTION/BREAKDOWN Description Patients skin will remain free from further infection or breakdown with mod I assist.  Outcome: Progressing   Problem: RH SAFETY Goal: RH STG ADHERE TO SAFETY PRECAUTIONS W/ASSISTANCE/DEVICE Description STG Adhere to Safety Precautions With mod I Assistance/Device.  Outcome: Progressing Goal: RH STG DEMO UNDERSTANDING HOME SAFETY PRECAUTIONS Outcome: Progressing Goal: RH OTHER STG SAFETY GOALS W/ASSIST Description Other STG Safety Goals With Assistance. Outcome: Progressing   Problem: RH PAIN MANAGEMENT Goal: RH STG PAIN MANAGED AT OR BELOW PT'S PAIN GOAL Description < 3  Outcome: Progressing   Problem: RH KNOWLEDGE DEFICIT Goal: RH STG INCREASE KNOWLEDGE OF HYPERTENSION Description With mod I assist.  Outcome: Progressing
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
## 2017-08-26 NOTE — Progress Notes (Signed)
Physical Therapy Session Note  Patient Details  Name: Riley Hartman MRN: [id number] Date of Birth: 1960-05-15  Today's Date: 08/26/2017 PT Individual Time: 0802-0900, 5009-3818 PT Individual Time Calculation (min): 58 min and 72 min   Short Term Goals: Week 1:  PT Short Term Goal 1 (Week 1): Pt will ambulate 150' w/ supervision using LRAD PT Short Term Goal 2 (Week 1): Pt will maintain dynamic standing balance w/ supervision  Skilled Therapeutic Interventions/Progress Updates:    Session 1: Pt seated in w/c upon PT arrival, agreeable to therapy tx and denies pain. Pt ambulated from room>dayroom with CGA and use of RW x 100 ft. Pt used biodex to work on limits of stability training, x 2 trials with verbal cues for weightshifting techniques. Pt ambulated to [employer] gym with RW and CGA. Pt worked on dynamic standing balance and UE coordination while standing on airex to hit lights using dynavision, decreased accuracy with R UE. Pt ambulated to gym with RW and CGA x 200 ft. Pt transferred to quadruped for neuro re-ed and strengthening to performed alternating hip extension 2 x 10 each side. Pt transferred to tall kneeling, verbal cues for increased hip extension, pt working on dynamic balance in this position while therapist performed manual perturbations. Pt transferred to supine and performed low back/hip stretches x 5 min. Pt performed x 10 single leg bridges each LE for neuro re-ed and strengthening. Pt ambulated into hallway and used railing to perform sidestepping 2 x 20 ft in each direction for hip strengthening. Pt performed 2 x 10 squats while holding rail for UE support, working on LE strength and focus on symmetric weightbearing. Pt performed toe taps to target on 4 inch step without UE support working on balance and coordination. Pt ambulated back to room with RW and CGA, left seated in w/c with needs in reach and chair alarm set.   Session 2: Pt seated in w/c upon PT arrival, agreeable to  therapy tx and denies pain. Pt propelled w/c from room<>outside using B UEs Mod I >200 ft. Pt ambulated 2 x 300 ft outside working on dynamic standing balance walking on unlevel surfaces with RW, min assist and verbal cues for safety awareness and obstacles. While outside pt ascended/descended 8 steps with single handrail and min assist, verbal cues for techniques. While outside pt navigated over curb x 2 using RW and verbal cues for techniques. Pt propelled w/c back to gym. Pt worked on dynamic standing balance and coordination without UE support to perform: toe taps on colored cones, anterior/posterior weightshifts on rocker board, lateral weightshifts on rocker board, perturbations on rocker board, tandem walking within parallel bars. Pt ambulated back to room with RW and CGA, occasional verbal cues for awareness. Pt left seated in w/c with needs in reach and chair alarm set.   Therapy Documentation Precautions:  Precautions Precautions: Fall Restrictions Weight Bearing Restrictions: No   See Function Navigator for Current Functional Status.   Therapy/Group: Individual Therapy  Netta Corrigan, PT, DPT 08/26/2017, 7:49 AM

## 2017-08-26 NOTE — Progress Notes (Signed)
Subjective/Complaints:  Good workout in PT, pt would like to go home this week  ROS: Patient denies  cough, shortness of breath or chest pain, joint or back pain, headache, or mood change.    Objective: Vital Signs: Blood pressure (!) 143/92, pulse 82, temperature 97.8 F (36.6 C), temperature source Oral, resp. rate 16, weight 96.3 kg (212 lb 4.9 oz), SpO2 95 %. No results found. Results for orders placed or performed during the hospital encounter of 08/20/17 (from the past 72 hour(s))  Basic metabolic panel     Status: None   Collection Time: 08/26/17  6:52 AM  Result Value Ref Range   Sodium 140 135 - 145 mmol/L   Potassium 3.6 3.5 - 5.1 mmol/L   Chloride 104 101 - 111 mmol/L   CO2 25 22 - 32 mmol/L   Glucose, Bld 76 65 - 99 mg/dL   BUN 17 6 - 20 mg/dL   Creatinine, Ser 1.01 0.61 - 1.24 mg/dL   Calcium 9.1 8.9 - 10.3 mg/dL   GFR calc non Af Amer >60 >60 mL/min   GFR calc Af Amer >60 >60 mL/min    Comment: (NOTE) The eGFR has been calculated using the CKD EPI equation. This calculation has not been validated in all clinical situations. eGFR's persistently <60 mL/min signify possible Chronic Kidney Disease.    Anion gap 11 5 - 15    Comment: Performed at [facility] [address] [place], [place] [address]  CBC     Status: Abnormal   Collection Time: 08/26/17  6:52 AM  Result Value Ref Range   WBC 10.6 (H) 4.0 - 10.5 K/uL   RBC 5.04 4.22 - 5.81 MIL/uL   Hemoglobin 14.7 13.0 - 17.0 g/dL   HCT 45.4 39.0 - 52.0 %   MCV 90.1 78.0 - 100.0 fL   MCH 29.2 26.0 - 34.0 pg   MCHC 32.4 30.0 - 36.0 g/dL   RDW 12.9 11.5 - 15.5 %   Platelets 199 150 - 400 K/uL    Comment: Performed at [facility], [place] [address], [place], [place] [address]     Constitutional: No distress . Vital signs reviewed. HEENT: EOMI, oral membranes moist Neck: supple Cardiovascular: RRR without murmur. No JVD    Respiratory: CTA Bilaterally without wheezes or rales. Normal effort     GI: BS +, non-tender, non-distended  Skin:   Intact Neuro: Alert/Oriented, Abnormal Sensory intact light touch but has parasthesia LUE and LLE,Parasthesia Right occipital area as well  Abnormal Motor 4- RUE and RLE, Abnormal FMC Ataxic/ dec FMC and speech dysarthric Musc/Skel:  Other no pain with UE or LE ROM Gen NAD, + dysmetria R FNF and R H->S   Assessment/Plan: 1. Functional deficits secondary to Left pontine infarct with right hemiparesis which require 3+ hours per day of interdisciplinary therapy in a comprehensive inpatient rehab setting. Physiatrist is providing close team supervision and 24 hour management of active medical problems listed below. Physiatrist and rehab team continue to assess barriers to discharge/monitor patient progress toward functional and medical goals. FIM: Function - Bathing Position: Shower Body parts bathed by patient: Right arm, Left arm, Chest, Abdomen, Front perineal area, Buttocks, Right upper leg, Left upper leg, Right lower leg, Left lower leg, Back Assist Level: Supervision or verbal cues  Function- Upper Body Dressing/Undressing What is the patient wearing?: Pull over shirt/dress Pull over shirt/dress - Perfomed by patient: Thread/unthread right sleeve, Thread/unthread left sleeve, Put head through opening,  Pull shirt over trunk Assist Level: More than reasonable time Function - Lower Body Dressing/Undressing What is the patient wearing?: Pants, Shoes, Socks Position: Wheelchair/chair at sink Pants- Performed by patient: Thread/unthread right pants leg, Thread/unthread left pants leg, Pull pants up/down Pants- Performed by helper: Pull pants up/down Socks - Performed by patient: Don/doff right sock, Don/doff left sock Shoes - Performed by patient: Don/doff right shoe, Don/doff left shoe Shoes - Performed by helper: Don/doff right shoe, Fasten right, Fasten left Assist for footwear: Supervision/touching assist Assist for lower body dressing:  Touching or steadying assistance (Pt > 75%)  Function - Toileting Toileting steps completed by patient: Adjust clothing prior to toileting, Performs perineal hygiene, Adjust clothing after toileting Toileting steps completed by helper: Adjust clothing prior to toileting, Performs perineal hygiene, Adjust clothing after toileting Assist level: Supervision or verbal cues  Function - Air cabin crew transfer assistive device: Grab bar, Walker Assist level to toilet: Touching or steadying assistance (Pt > 75%) Assist level from toilet: Touching or steadying assistance (Pt > 75%)  Function - Chair/bed transfer Chair/bed transfer method: Stand pivot, Ambulatory Chair/bed transfer assist level: Touching or steadying assistance (Pt > 75%) Chair/bed transfer assistive device: Armrests, Walker Chair/bed transfer details: Verbal cues for safe use of DME/AE, Verbal cues for precautions/safety, Verbal cues for technique  Function - Locomotion: Wheelchair Will patient use wheelchair at discharge?: (TBD) Type: Manual Max wheelchair distance: 200' Assist Level: Supervision or verbal cues Assist Level: Supervision or verbal cues Assist Level: Supervision or verbal cues Turns around,maneuvers to table,bed, and toilet,negotiates 3% grade,maneuvers on rugs and over doorsills: No Function - Locomotion: Ambulation Assistive device: Walker-rolling Max distance: 200 ft Assist level: Touching or steadying assistance (Pt > 75%) Assist level: Touching or steadying assistance (Pt > 75%) Assist level: Touching or steadying assistance (Pt > 75%) Assist level: Touching or steadying assistance (Pt > 75%) Walk 10 feet on uneven surfaces activity did not occur: Safety/medical concerns  Function - Comprehension Comprehension: Auditory Comprehension assist level: Follows complex conversation/direction with no assist  Function - Expression Expression: Verbal Expression assist level: Expresses complex  ideas: With no assist  Function - Social Interaction Social Interaction assist level: Interacts appropriately with others with medication or extra time (anti-anxiety, antidepressant).  Function - Problem Solving Problem solving assist level: Solves complex problems: With extra time  Function - Memory Memory assist level: More than reasonable amount of time Patient normally able to recall (first 3 days only): Current season, Location of own room, That he or she is in a hospital, Staff names and faces  Medical Problem List and Plan: 1. Right hemiparesis, dysarthria, dysmetria, and gait disturbance secondary to Left pontine infarct   -continue PT, OT, SLP , Team conference today please see physician documentation under team conference tab, met with team face-to-face to discuss problems,progress, and goals. Formulized individual treatment plan based on medical history, underlying problem and comorbidities. 2. DVT Prophylaxis/Anticoagulation: Pharmaceutical:Lovenox PLT and Hgb  nl  3.DDD/Pain Management: Voltaren gel to neck/shoulders. Tylenol prn, cervicalgia due to DDD,may incorporate some treatment in PT/OT but as discussed with pt CVA deficits are more functionally relevant CVA  Add gabapentin 4. Mood:Continue klonopin prn for anxiety.   -team to provide ego support as possible 5. Neuropsych: This patientiscapable of making decisions on hisown behalf. 6. Skin/Wound Care:routine pressure relief measures 7. Fluids/Electrolytes/Nutrition:encourage PO normal BMET 5/15 8. HTN:  Vitals:   08/25/17 2134 08/26/17 0404  BP: (!) 166/94 (!) 143/92  Pulse: 78 82  Resp: 18  16  Temp: 97.7 F (36.5 C) 97.8 F (36.6 C)  SpO2: 95% 95%  SBP with elevation this morning. Observe for pattern  Before adjustments       9. Dyslipidemia: now on Lipitor.  10. BPH?: continue Proscar and Flomax    LOS (Days) 6 A FACE TO FACE EVALUATION WAS PERFORMED  Charlett Blake 08/26/2017, 9:10 AM

## 2017-08-26 NOTE — Progress Notes (Signed)
Occupational Therapy Session Note  Patient Details  Name: Riley Hartman MRN: [id number] Date of Birth: Nov 28, 1960  Today's Date: 08/26/2017  Session 1 OT Individual Time: 1030-1100 OT Individual Time Calculation (min): 30 min   Session 2 OT Individual Time: 1130-1200 OT Individual Time Calculation (min): 30 min    Short Term Goals: Week 1:  OT Short Term Goal 1 (Week 1): STG=LTG 2/2 ELOS  Skilled Therapeutic Interventions/Progress Updates:   Session 1 Pt greeted sitting in wc and agreeable to OT. Pt had already collected clothing for shower at wc level. Pt ambulated to shower with RW and close supervision with intermittent CGA for turning to sit onto bench. Pt doffed clothing supervision. Bathing completed with supervision for balance when standing to wash buttocks. Stand-pivot out of shower to wc for time management with supervision. Dressing completed wc level with supervision and increased time to don and tie shoes. Pt left seated in wc with chair alarm on and needs met.   Session 2  B UE strength/coordination with wc propulsion to therapy apartment. Discussed friends bathroom set-up vs home bathroom set-up and discussed barriers  And modifications for safe shower transfers. Pt completed tub bench transfer with supervision, then walk-in shower transfer with supervision and verbal cues for R LE placement. Pt states he has a built in shower seat and grab bar for use at home and plans to go home for showers since he lives close. Worked on dynamic standing balance and functional use of R UE with Wii bowling activity. Pt needed supervision for balance and occasional CGA for lateral LOB with step through. Pt returned to room and left seated in wc with chair alarm on and needs met.   Therapy Documentation Precautions:  Precautions Precautions: Fall Restrictions Weight Bearing Restrictions: No Pain:  none/denies pain ADL: ADL ADL Comments: Please see functional navigator  See Function  Navigator for Current Functional Status.   Therapy/Group: Individual Therapy  Valma Cava 08/26/2017, 12:12 PM

## 2017-08-26 NOTE — Discharge Summary (Signed)
Physician Discharge Summary  Patient ID: Riley Hartman MRN: [id number] DOB/AGE: 59-Jan-1962 59 y.o.  Admit date: 08/20/2017 Discharge date: 08/29/2017  Discharge Diagnoses:  Principal Problem:   Cerebral thrombosis with cerebral infarction Active Problems:   ADHD   Anxiety disorder   Dysarthria, post-stroke   Gait disturbance, post-stroke   Right hemiparesis [facility])   Discharged Condition: stable   Significant Diagnostic Studies: N/A   Labs:  Basic Metabolic Panel:  BMP Latest Ref Rng & Units 08/31/2017 08/31/2017 08/26/2017  Glucose 65 - 99 mg/dL 109(H) 108(H) 76  BUN 6 - 20 mg/dL 10 13 17   Creatinine 0.61 - 1.24 mg/dL 0.81 0.70 1.01  Sodium 135 - 145 mmol/L 142 143 140  Potassium 3.5 - 5.1 mmol/L 3.7 3.7 3.6  Chloride 101 - 111 mmol/L 107 103 104  CO2 22 - 32 mmol/L 27 - 25  Calcium 8.9 - 10.3 mg/dL 9.2 - 9.1    CBC: CBC Latest Ref Rng & Units 08/31/2017 08/31/2017 08/26/2017  WBC 4.0 - 10.5 K/uL 9.1 - 10.6(H)  Hemoglobin 13.0 - 17.0 g/dL 13.7 13.6 14.7  Hematocrit 39.0 - 52.0 % 41.5 40.0 45.4  Platelets 150 - 400 K/uL 201 - 199    CBG: No results for input(s): GLUCAP in the last 168 hours.  Brief HPI:   Riley Hartman is a 57 year old male with history of non-obstructive CAD, ADHD, chronic neck pain/DDD with history of numbness and tingling episodes, peripheral neuropathy, who was admitted on 08/18/2017 with decreased coordination bilateral upper extremity, dysarthria, difficulty walking and numbness on right side.  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  Dr. Erlinda Hong felt stroke likely due to small vessel disease and recommended 3 months of aspirin Plavix combination followed by aspirin or Plavix alone as well as aggressive management of risk factors.  Therapies initiated revealing functional deficits that was CIR was  recommended for follow-up therapy.   Hospital Course: Riley Hartman was admitted to rehab 08/20/2017 for inpatient therapies to consist of PT, ST and OT at least three hours five days a week. Past admission physiatrist, therapy team and rehab RN have worked together to provide customized collaborative inpatient rehab. Gabapentin was added to help with neuropathic symptoms. Blood pressures were monitored on bid basis and are slowly improving. Mood has been stable and neuropsychology was consulted for evaluation. He was advised to avoid use of short acting ritalin to prevent worsening of his anxiety state/frustration.  He is continent of bowel and bladder. He has made steady progress and supervision is recommended for safety at discharge. He will continue to receive follow up Outpatient PT, and OT at [facility] after discharge.    Rehab course: During patient's stay in rehab weekly team conferences were held to monitor patient's progress, set goals and discuss barriers to discharge. At admission, patient required min to mod assist for mobility and min to mod assist with basic self-care tasks.   Speech therapy evaluation revealed  Mild dysarthria with good awareness of deficits . He  has had improvement in activity tolerance, balance, postural control as well as ability to compensate for deficits. He has had improvement in functional use RUE  and RLE as well as improvement in awareness.  He is able to perform ADL tasks with supervision. He is modified independent for transfers and needs supervision to ambulate 150'  with straight cane. Speech therapy signed off by 5/13 as patient was able to use compensatory strategies independently. Family education was completed regarding all aspects of care.     Disposition: Home  Diet: Heart healthy.   Special Instructions: 1. No driving or strenuous activity till cleared by MD.  Discharge Instructions    Ambulatory referral to Physical Medicine Rehab   Complete  by:  As directed    1-2 weeks transitional care appt    2.   Allergies as of 08/29/2017      Reactions   Gadolinium Derivatives Other (See Comments)   Slight low BP, lethargy, fatigue x 1 week      Medication List    STOP taking these medications   lisinopril 20 MG tablet Commonly known as:  PRINIVIL,ZESTRIL   methylphenidate 20 MG ER tablet Commonly known as:  METADATE ER     TAKE these medications   acetaminophen 325 MG tablet Commonly known as:  TYLENOL Take 1-2 tablets (325-650 mg total) by mouth every 4 (four) hours as needed for mild pain.   aspirin 81 MG EC tablet Take 1 tablet (81 mg total) by mouth daily.   atorvastatin 80 MG tablet Commonly known as:  LIPITOR Take 1 tablet (80 mg total) by mouth daily at 6 PM.   clonazePAM 1 MG tablet Commonly known as:  KLONOPIN TAKE ONE TABLET BY MOUTH TWICE DAILY AS NEEDED What changed:    how much to take  how to take this  when to take this   clopidogrel 75 MG tablet Commonly known as:  PLAVIX Take 1 tablet (75 mg total) by mouth daily.   diclofenac sodium 1 % Gel Commonly known as:  VOLTAREN Apply 2 g topically 4 (four) times daily. Please apply at the base of the head, upper neck region.   finasteride 5 MG tablet Commonly known as:  PROSCAR Take 5 mg by mouth daily.   gabapentin 300 MG capsule Commonly known as:  NEURONTIN Take 1 capsule (300 mg total) by mouth 2 (two) times daily.   tamsulosin 0.4 MG Caps capsule Commonly known as:  FLOMAX TAKE ONE CAPSULE BY MOUTH ONCE DAILY What changed:    how much to take  how to take this  when to take this      Follow-up Information    Kirsteins, Luanna Salk, MD Follow up.   Specialty:  Physical Medicine and Rehabilitation Why:  office will call you with follow up appointment Contact information: [place] [address] [phone]        Penni Bombard, MD Follow up.   Specialties:  Neurology, Radiology Contact  information: [address] [place] [address] [phone]        Laurey Morale, MD Follow up on 09/02/2017.   Specialty:  Family Medicine Why:  Appointment @ 10:00 AM Contact information: [place] [address] [phone]           Signed: Bary Leriche 09/01/2017, 5:02 PM

## 2017-08-26 NOTE — Progress Notes (Signed)
Social Work Patient ID: Riley Hartman, male   DOB: 1960-07-06, 57 y.o.   MRN: [id number]  Met with pt to discuss team conference goals supervision-mod/i level and discharge date 5/18. He feels he is ready and will have someone with him at home. He realizes he moves quickly and always has. Discussed OP therapies and he plans to come back to the [facility]. Has all equipment. Work toward discharge Sat.

## 2017-08-27 ENCOUNTER — Inpatient Hospital Stay (HOSPITAL_COMMUNITY): Payer: BLUE CROSS/BLUE SHIELD | Admitting: *Deleted

## 2017-08-27 ENCOUNTER — Inpatient Hospital Stay (HOSPITAL_COMMUNITY): Payer: BLUE CROSS/BLUE SHIELD | Admitting: Physical Therapy

## 2017-08-27 ENCOUNTER — Inpatient Hospital Stay (HOSPITAL_COMMUNITY): Payer: BLUE CROSS/BLUE SHIELD | Admitting: Occupational Therapy

## 2017-08-27 MED ORDER — GABAPENTIN 300 MG PO CAPS
300.0000 mg | ORAL_CAPSULE | Freq: Two times a day (BID) | ORAL | Status: DC
  Administered 2017-08-28 – 2017-08-29 (×3): 300 mg via ORAL
  Filled 2017-08-27 (×3): qty 1

## 2017-08-27 MED ORDER — GABAPENTIN 100 MG PO CAPS
100.0000 mg | ORAL_CAPSULE | Freq: Three times a day (TID) | ORAL | Status: DC
  Administered 2017-08-27: 100 mg via ORAL
  Filled 2017-08-27: qty 1

## 2017-08-27 NOTE — Progress Notes (Addendum)
Social Work  Discharge Note  The overall goal for the admission was met for: DC Sat 5/18  Discharge location: [place] TO ASSIST BETWEEN TWO OF THEM CAN PROVIDE 24 HR SUPERVISION  Length of Stay: Yes-9 DAYS  Discharge activity level: Yes-SUPERVISION LEVEL  Home/community participation: Yes  Services provided included: MD, RD, PT, OT, SLP, RN, CM, Pharmacy, Neuropsych and SW  Financial Services: Private Insurance: [place]  Follow-up services arranged: Outpatient: [facility]-PT & OT 5/21 10:45-12:30   Comments (or additional information):FRIEND OR WIFE CAN PROVIDE SUPERVISION, PT AWARE HE NEEDS SOMEONE WITH HIM DUE TO IMPULSIVE  Patient/Family verbalized understanding of follow-up arrangements: Yes  Individual responsible for coordination of the follow-up plan: SELF & CONNIE-WIFE  Confirmed correct DME delivered: Elease Hashimoto 08/27/2017    Elease Hashimoto

## 2017-08-27 NOTE — Progress Notes (Signed)
Recreational Therapy Session Note  Patient Details  Name: Riley Hartman MRN: [id number] Date of Birth: [birth year] Today's Date: 08/27/2017  Pain: no c/o Skilled Therapeutic Interventions/Progress Updates: Full eval deferred as pt is discharging home 5/18.  TR session spent discussing leisure interests, educating pt on activity alanysis with potential modifications, community pursuits and the importance of staying active and its effect on overall heal th & wellness.  Pt stated understanding.   [place] 08/27/2017, 11:15 AM

## 2017-08-27 NOTE — Progress Notes (Signed)
Subjective/Complaints:  Burning pain in RIght forearm and back of head slightly diminished with Gabapentin pt would like to increase  ROS: Patient denies  cough, shortness of breath or chest pain, joint or back pain, headache, or mood change.    Objective: Vital Signs: Blood pressure 133/64, pulse 81, temperature 98 F (36.7 C), temperature source Oral, resp. rate 16, weight 96.3 kg (212 lb 4.9 oz), SpO2 96 %. No results found. Results for orders placed or performed during the hospital encounter of 08/20/17 (from the past 72 hour(s))  Basic metabolic panel     Status: None   Collection Time: 08/26/17  6:52 AM  Result Value Ref Range   Sodium 140 135 - 145 mmol/L   Potassium 3.6 3.5 - 5.1 mmol/L   Chloride 104 101 - 111 mmol/L   CO2 25 22 - 32 mmol/L   Glucose, Bld 76 65 - 99 mg/dL   BUN 17 6 - 20 mg/dL   Creatinine, Ser 1.01 0.61 - 1.24 mg/dL   Calcium 9.1 8.9 - 10.3 mg/dL   GFR calc non Af Amer >60 >60 mL/min   GFR calc Af Amer >60 >60 mL/min    Comment: (NOTE) The eGFR has been calculated using the CKD EPI equation. This calculation has not been validated in all clinical situations. eGFR's persistently <60 mL/min signify possible Chronic Kidney Disease.    Anion gap 11 5 - 15    Comment: Performed at [facility] [address] [place], [place] [address]  CBC     Status: Abnormal   Collection Time: 08/26/17  6:52 AM  Result Value Ref Range   WBC 10.6 (H) 4.0 - 10.5 K/uL   RBC 5.04 4.22 - 5.81 MIL/uL   Hemoglobin 14.7 13.0 - 17.0 g/dL   HCT 45.4 39.0 - 52.0 %   MCV 90.1 78.0 - 100.0 fL   MCH 29.2 26.0 - 34.0 pg   MCHC 32.4 30.0 - 36.0 g/dL   RDW 12.9 11.5 - 15.5 %   Platelets 199 150 - 400 K/uL    Comment: Performed at [facility], [place] [address] [place], [place] [address]     Constitutional: No distress . Vital signs reviewed. HEENT: EOMI, oral membranes moist Neck: supple Cardiovascular: RRR without murmur. No JVD    Respiratory: CTA  Bilaterally without wheezes or rales. Normal effort    GI: BS +, non-tender, non-distended  Skin:   Intact Neuro: Alert/Oriented, Abnormal Sensory intact light touch but has parasthesia LUE and LLE,Parasthesia Right occipital area as well  Abnormal Motor 4- RUE and RLE, Abnormal FMC Ataxic/ dec FMC and speech dysarthric Musc/Skel:  Other no pain with UE or LE ROM Gen NAD, + dysmetria R FNF and R H->S   Assessment/Plan: 1. Functional deficits secondary to Left pontine infarct with right hemiparesis which require 3+ hours per day of interdisciplinary therapy in a comprehensive inpatient rehab setting. Physiatrist is providing close team supervision and 24 hour management of active medical problems listed below. Physiatrist and rehab team continue to assess barriers to discharge/monitor patient progress toward functional and medical goals. FIM: Function - Bathing Position: Shower Body parts bathed by patient: Right arm, Left arm, Chest, Abdomen, Front perineal area, Buttocks, Right upper leg, Left upper leg, Right lower leg, Left lower leg, Back Assist Level: Supervision or verbal cues  Function- Upper Body Dressing/Undressing What is the patient wearing?: Pull over shirt/dress Pull over shirt/dress - Perfomed by patient: Thread/unthread right sleeve, Thread/unthread left  sleeve, Put head through opening, Pull shirt over trunk Assist Level: No help, No cues Function - Lower Body Dressing/Undressing What is the patient wearing?: Pants, Socks, Shoes, Underwear Position: Wheelchair/chair at sink Underwear - Performed by patient: Thread/unthread right underwear leg, Thread/unthread left underwear leg, Pull underwear up/down Pants- Performed by patient: Thread/unthread right pants leg, Thread/unthread left pants leg, Pull pants up/down, Fasten/unfasten pants Pants- Performed by helper: Pull pants up/down Socks - Performed by patient: Don/doff right sock, Don/doff left sock Shoes - Performed by  patient: Don/doff right shoe, Fasten left, Fasten right, Don/doff left shoe Shoes - Performed by helper: Don/doff right shoe, Fasten right, Fasten left Assist for footwear: Setup Assist for lower body dressing: Supervision or verbal cues  Function - Toileting Toileting steps completed by patient: Adjust clothing prior to toileting, Performs perineal hygiene, Adjust clothing after toileting Toileting steps completed by helper: Adjust clothing prior to toileting, Performs perineal hygiene, Adjust clothing after toileting Toileting Assistive Devices: Grab bar or rail Assist level: Supervision or verbal cues  Function - Air cabin crew transfer assistive device: Grab bar, Walker Assist level to toilet: Touching or steadying assistance (Pt > 75%) Assist level from toilet: Touching or steadying assistance (Pt > 75%)  Function - Chair/bed transfer Chair/bed transfer method: Stand pivot, Ambulatory Chair/bed transfer assist level: Touching or steadying assistance (Pt > 75%) Chair/bed transfer assistive device: Armrests, Walker Chair/bed transfer details: Verbal cues for safe use of DME/AE, Verbal cues for precautions/safety, Verbal cues for technique  Function - Locomotion: Wheelchair Will patient use wheelchair at discharge?: (TBD) Type: Manual Max wheelchair distance: 200' Assist Level: Supervision or verbal cues Assist Level: Supervision or verbal cues Assist Level: Supervision or verbal cues Turns around,maneuvers to table,bed, and toilet,negotiates 3% grade,maneuvers on rugs and over doorsills: No Function - Locomotion: Ambulation Assistive device: Walker-rolling Max distance: 200 ft Assist level: Touching or steadying assistance (Pt > 75%) Assist level: Touching or steadying assistance (Pt > 75%) Assist level: Touching or steadying assistance (Pt > 75%) Assist level: Touching or steadying assistance (Pt > 75%) Walk 10 feet on uneven surfaces activity did not occur:  Safety/medical concerns Assist level: Touching or steadying assistance (Pt > 75%)  Function - Comprehension Comprehension: Auditory Comprehension assist level: Follows complex conversation/direction with no assist  Function - Expression Expression: Verbal Expression assist level: Expresses complex ideas: With no assist  Function - Social Interaction Social Interaction assist level: Interacts appropriately with others with medication or extra time (anti-anxiety, antidepressant).  Function - Problem Solving Problem solving assist level: Solves complex problems: With extra time  Function - Memory Memory assist level: More than reasonable amount of time Patient normally able to recall (first 3 days only): Current season, Location of own room, That he or she is in a hospital, Staff names and faces  Medical Problem List and Plan: 1. Right hemiparesis, dysarthria, dysmetria, and gait disturbance secondary to Left pontine infarct   -continue PT, OT, SLP plan d/c on 5/18   ,2. DVT Prophylaxis/Anticoagulation: Pharmaceutical:Lovenox PLT and Hgb  nl  3.DDD/Pain Management: Voltaren gel to neck/shoulders. Tylenol prn, cervicalgia due to DDD,may incorporate some treatment in PT/OT but as discussed with pt CVA deficits are more functionally relevant- no NSAIDs, order Kpad CVA  Increase  Gabapentin to TID 4. Mood:Continue klonopin prn for anxiety.   -team to provide ego support as possible 5. Neuropsych: This patientiscapable of making decisions on hisown behalf. 6. Skin/Wound Care:routine pressure relief measures 7. Fluids/Electrolytes/Nutrition:encourage PO normal BMET 5/15 8. HTN:  Vitals:   08/26/17 1921 08/27/17 0418  BP: (!) 146/93 133/64  Pulse: 76 81  Resp: 16 16  Temp: 97.8 F (36.6 C) 98 F (36.7 C)  SpO2: 97% 96%  SBP improved this morning.  Before adjustments       9. Dyslipidemia: now on Lipitor.  10. BPH?: continue Proscar and Flomax    LOS (Days) 7 A FACE  TO FACE EVALUATION WAS PERFORMED  Charlett Blake 08/27/2017, 8:07 AM

## 2017-08-27 NOTE — Progress Notes (Signed)
Physical Therapy Discharge Summary  Patient Details  Name: Riley Hartman MRN: [id number] Date of Birth: [birth year]  Today's Date: 08/28/2017 PT Individual Time: 1100-1155 AND [id number] PT Individual Time Calculation (min): 55 min AND 54 min  Session 1:  Pt in w/c and agreeable to therapy, denies pain. Session focused on functional mobility and dynamic standing balance w/o UE support. Performed functional mobility as outlined below including gait, bed mobility, negotiating stairs, and transfers. Performed dynamic standing balance tasks while playing Wii bowling and tennis emphasizing increased trunk rotation and stepping strategies for balance. Supervision for safety. Returned to room and ended session in w/c, call bell within reach and all needs met.   Session 2:  Pt in w/c and agreeable to therapy, denies pain. Session focused on tasks related to pt's job and activities he enjoys doing such as Haematologist, working w/ tools, and Community education officer. Practiced multiple floor transfers w/ and w/o UE support to push up on mat w/ supervision and verbal cues for technique. Practiced getting into supine and squatting positions pt uses to work on his sink, and household appliances w/ supervision and verbal cues for technique as well. Educated on precautions to take w/ these activities such as making sure he has someone with him for safety and gradually building up his endurance. Performed NuStep 12 min @ level 6 for LE strengthening and endurance. Continued discussion of gradual return to prior level of function and moving cautiously w/ everything until pt and caregivers/friends feel confident that he is safe. Pt verbalized understanding and in agreement. Returned to room and ended session in w/c, call bell within reach and all needs met.   Patient has met 6 of 6 long term goals due to improved activity tolerance, improved balance, increased strength, ability to compensate for deficits and improved coordination.   Patient to discharge at an ambulatory level Supervision.   Patient's care partner is independent to provide the necessary physical assistance at discharge. He has a large support system, in addition to his wife and son, who all plan to be involved at discharge in order to ensure patient's successful return to his community. He plans to discharge to his friend's house, where his friend can provide 24/7 supervision, as patient's wife works during the day.  Reasons goals not met: n/a  Recommendation:  Patient will benefit from ongoing skilled PT services in outpatient setting to continue to advance safe functional mobility, address ongoing impairments in functional balance, endurance, RLE proprioception and sensation, and RLE strength, and minimize fall risk.  Equipment: No equipment provided  Reasons for discharge: treatment goals met and discharge from hospital  Patient/family agrees with progress made and goals achieved: Yes  PT Discharge Precautions/Restrictions Precautions Precautions: Fall Restrictions Weight Bearing Restrictions: No Pain Pain Assessment Pain Scale: 0-10 Pain Score: 0-No pain Vision/Perception  Perception Perception: Within Functional Limits Praxis Praxis: Intact  Cognition Overall Cognitive Status: Within Functional Limits for tasks assessed Arousal/Alertness: Awake/alert Orientation Level: Oriented X4 Safety/Judgment: Appears intact Sensation Sensation Light Touch: Impaired by gross assessment(diminished sensation throughout RLE) Proprioception: Impaired by gross assessment(impaired RLE) Coordination Gross Motor Movements are Fluid and Coordinated: Yes Fine Motor Movements are Fluid and Coordinated: Yes Motor  Motor Motor: Hemiplegia Motor - Discharge Observations: Mild R hemi  Mobility Bed Mobility Bed Mobility: Rolling Right;Rolling Left;Supine to Sit;Sit to Supine Rolling Right: 6: Modified independent (Device/Increase time) Rolling Left: 6:  Modified independent (Device/Increase time) Supine to Sit: 6: Modified independent (Device/Increase time) Sit to Supine: 6: Modified independent (  Device/Increase time) Transfers Transfers: Yes Sit to Stand: 6: Modified independent (Device/Increase time) Stand to Sit: 6: Modified independent (Device/Increase time) Stand Pivot Transfers: 6: Modified independent (Device/Increase time) Locomotion  Ambulation Ambulation: Yes Ambulation/Gait Assistance: 5: Supervision Ambulation Distance (Feet): 150 Feet Assistive device: Straight cane Ambulation/Gait Assistance Details: Verbal cues for precautions/safety Gait Gait: Yes Gait Pattern: Impaired Gait velocity: decreased Stairs / Additional Locomotion Stairs: Yes Stairs Assistance: 5: Supervision Stairs Assistance Details: Verbal cues for precautions/safety;Verbal cues for technique Stair Management Technique: One rail Left Number of Stairs: 12 Height of Stairs: 6 Wheelchair Mobility Wheelchair Mobility: No  Trunk/Postural Assessment  Cervical Assessment Cervical Assessment: Within Functional Limits Thoracic Assessment Thoracic Assessment: Within Functional Limits Lumbar Assessment Lumbar Assessment: Within Functional Limits Postural Control Postural Control: Within Functional Limits  Balance Balance Balance Assessed: Yes Static Sitting Balance Static Sitting - Balance Support: No upper extremity supported;Feet supported Static Sitting - Level of Assistance: 6: Modified independent (Device/Increase time) Dynamic Sitting Balance Dynamic Sitting - Balance Support: No upper extremity supported;Feet supported Dynamic Sitting - Level of Assistance: 6: Modified independent (Device/Increase time) Static Standing Balance Static Standing - Balance Support: No upper extremity supported;During functional activity Static Standing - Level of Assistance: 6: Modified independent (Device/Increase time) Dynamic Standing Balance Dynamic  Standing - Balance Support: No upper extremity supported;During functional activity Dynamic Standing - Level of Assistance: 5: Stand by assistance Extremity Assessment  RLE Assessment RLE Assessment: Exceptions to WFL(4/5 globally) LLE Assessment LLE Assessment: Within Functional Limits   See Function Navigator for Current Functional Status.  Yevonne Yokum K Arnette 08/28/2017, 12:34 PM

## 2017-08-27 NOTE — Progress Notes (Signed)
Occupational Therapy Session Note  Patient Details  Name: Riley Hartman MRN: [id number] Date of Birth: 1960-06-03  Today's Date: 08/27/2017  Session 1 OT Individual Time: 1000-1045 OT Individual Time Calculation (min): 45 min   Session 2 OT Individual Time: 1500-1530 OT Individual Time Calculation (min): 30 min    Short Term Goals: Week 1:  OT Short Term Goal 1 (Week 1): STG=LTG 2/2 ELOS  Skilled Therapeutic Interventions/Progress Updates:  Session 1   Pt greeted sitting in wc with son present and agreeable to OT treatment session. Pt ambulated to therapy gym with RW and supervision/intermittent CGA. R fine motor coordination with nut, bolt, and washer activity. Focus on finger rotation to screw nuts onto bolts of various sizes. Progressed to screw driver activity using #2 Hardin Negus screw driver and shelf bracket. B UE coordination with ball toss activity focus on chest press and overhead pass. Pt ambulated back to room at end of session in similar fashion as above and left seated in wc with chair alarm on and needs met.   Session 2 BUE strength/coordination with wc propulsion to dayroom. Worked on fine motor coordination with card shuffling task. Pt played dealer in game of [place] with focus on R in-hand manipulation and palmer translation to deal cards. Pt returned to room at end of session in similar fashion and left seated in wc with chair alarm on and needs met.   Therapy Documentation Precautions:  Precautions Precautions: Fall Restrictions Weight Bearing Restrictions: No Pain: Pain Assessment Pain Scale: 0-10 Pain Score:0 denies pain ADL: ADL ADL Comments: Please see functional navigator  See Function Navigator for Current Functional Status.   Therapy/Group: Individual Therapy  Valma Cava 08/27/2017, 3:32 PM

## 2017-08-27 NOTE — Plan of Care (Signed)
  Problem: Consults Goal: RH STROKE PATIENT EDUCATION Description See Patient Education module for education specifics  Outcome: Progressing   Problem: RH BOWEL ELIMINATION Goal: RH STG MANAGE BOWEL WITH ASSISTANCE Description STG Manage Bowel with mod I Assistance.  Outcome: Progressing Goal: RH STG MANAGE BOWEL W/MEDICATION W/ASSISTANCE Description STG Manage Bowel with Medication with mod I  Assistance.  Outcome: Progressing   Problem: RH SKIN INTEGRITY Goal: RH STG SKIN FREE OF INFECTION/BREAKDOWN Description Patients skin will remain free from further infection or breakdown with mod I assist.  Outcome: Progressing   Problem: RH SAFETY Goal: RH STG ADHERE TO SAFETY PRECAUTIONS W/ASSISTANCE/DEVICE Description STG Adhere to Safety Precautions With mod I Assistance/Device.  Outcome: Progressing Goal: RH STG DEMO UNDERSTANDING HOME SAFETY PRECAUTIONS Outcome: Progressing Goal: RH OTHER STG SAFETY GOALS W/ASSIST Description Other STG Safety Goals With Assistance. Outcome: Progressing   Problem: RH PAIN MANAGEMENT Goal: RH STG PAIN MANAGED AT OR BELOW PT'S PAIN GOAL Description < 3  Outcome: Progressing   Problem: RH KNOWLEDGE DEFICIT Goal: RH STG INCREASE KNOWLEDGE OF HYPERTENSION Description With mod I assist.  Outcome: Progressing

## 2017-08-27 NOTE — Progress Notes (Signed)
Physical Therapy Session Note  Patient Details  Name: Riley Hartman MRN: [id number] Date of Birth: 03/23/1961  Today's Date: 08/27/2017 PT Individual Time: 0800-0859 AND 1300-1400 PT Individual Time Calculation (min): 59 min AND 60 min  Short Term Goals: Week 1:  PT Short Term Goal 1 (Week 1): Pt will ambulate 150' w/ supervision using LRAD PT Short Term Goal 2 (Week 1): Pt will maintain dynamic standing balance w/ supervision  Skilled Therapeutic Interventions/Progress Updates:   Session 1:  Pt in w/c and agreeable to therapy, denies pain. Session focused on gait training and RLE NMR w/ dynamic standing balance and w/ gait. Ambulated to/from therapy gym w/ supervision using RW, ~200' each way. Performed Berg Balance Scale as detailed below and explained significance of results to pt including fall risk and specific tasks that he has trouble with. Performed dynamic gait for sequencing and proprioception of RLE including sideways walking at rail 30' x2 and backwards walking 30'. Trial-ed gait w/ SPC, pt continues to have R knee hyperextension w/ about the same frequency as when ambulating w/ RW and pt able to correct 1 LOB w/ min assist when w/ SPC. Trial-ed use of posterior leaf spring AFO for knee hyperextension control w/ mild improvement, however pt continues to hyperextend ~75% of time even w/ stiffer brace. Pt also notes that he probably will not wear brace after d/c despite education of it's knee protection benefits. Will continue to trial gait w/ LRAD and w/ possible RAFO use. Returned to room and ended session in w/c, call bell within reach and all needs met.   Session 2:  Pt in w/c and agreeable to therapy, denies pain. Ambulated to gym w/ close supervision using SPC, 200'. Performed LE NMR w/ emphasis on R knee control including ball toss on foam pad w/ partial knee bend, mini squats w/ 2 kg medicine ball on foam pad, and R forward and lateral stepping for increased proprioception. Close  supervision for all tasks for safety, no overt LOB. Ambulated outside in community environment w/ [facility] 2nd half of session in >500' bouts w/ close supervision to min guard in busier environments w/ obstacle negotiation. Brief seated rest breaks in between gait bouts. Negotiated 8 steps w/ min guard and 12 steps w/ min guard at 2 separate times. Additionally negotiated uneven surfaces. Continued to emphasize R knee control w/ gait to prevent hyperextension, decreased ability to self-monitor w/ fatigue. Returned to room and ended session in w/c, call bell within reach and all needs met.   Therapy Documentation Precautions:  Precautions Precautions: Fall Restrictions Weight Bearing Restrictions: No Pain: Pain Assessment Pain Scale: 0-10 Pain Score: 8  Pain Type: Acute pain Pain Location: Neck Pain Descriptors / Indicators: Aching Pain Onset: On-going Pain Intervention(s): Refused Balance: Standardized Balance Assessment Standardized Balance Assessment: Berg Balance Test Berg Balance Test Sit to Stand: Able to stand without using hands and stabilize independently Standing Unsupported: Able to stand safely 2 minutes Sitting with Back Unsupported but Feet Supported on Floor or Stool: Able to sit safely and securely 2 minutes Stand to Sit: Sits safely with minimal use of hands Transfers: Able to transfer safely, minor use of hands Standing Unsupported with Eyes Closed: Able to stand 10 seconds with supervision Standing Ubsupported with Feet Together: Able to place feet together independently and stand for 1 minute with supervision From Standing, Reach Forward with Outstretched Arm: Can reach forward >12 cm safely (5") From Standing Position, Pick up Object from Floor: Able to pick up  shoe safely and easily From Standing Position, Turn to Look Behind Over each Shoulder: Looks behind from both sides and weight shifts well Turn 360 Degrees: Able to turn 360 degrees safely but slowly Standing  Unsupported, Alternately Place Feet on Step/Stool: Able to complete 4 steps without aid or supervision Standing Unsupported, One Foot in Front: Able to plae foot ahead of the other independently and hold 30 seconds Standing on One Leg: Tries to lift leg/unable to hold 3 seconds but remains standing independently Total Score: 45  See Function Navigator for Current Functional Status.   Therapy/Group: Individual Therapy  Riley Hartman 08/27/2017, 9:00 AM

## 2017-08-28 ENCOUNTER — Inpatient Hospital Stay (HOSPITAL_COMMUNITY): Payer: BLUE CROSS/BLUE SHIELD | Admitting: Physical Therapy

## 2017-08-28 ENCOUNTER — Inpatient Hospital Stay (HOSPITAL_COMMUNITY): Payer: BLUE CROSS/BLUE SHIELD | Admitting: Occupational Therapy

## 2017-08-28 MED ORDER — DICLOFENAC SODIUM 1 % TD GEL: 2.0000 g | Freq: Four times a day (QID) | TRANSDERMAL | 0 refills | Status: DC

## 2017-08-28 MED ORDER — ACETAMINOPHEN 325 MG PO TABS: 325.0000 mg | ORAL_TABLET | ORAL | Status: DC | PRN

## 2017-08-28 MED ORDER — CLOPIDOGREL BISULFATE 75 MG PO TABS: 75.0000 mg | ORAL_TABLET | Freq: Every day | ORAL | 0 refills | Status: AC

## 2017-08-28 MED ORDER — ATORVASTATIN CALCIUM 80 MG PO TABS: 80.0000 mg | ORAL_TABLET | Freq: Every day | ORAL | 0 refills | Status: DC

## 2017-08-28 MED ORDER — GABAPENTIN 300 MG PO CAPS: 300.0000 mg | ORAL_CAPSULE | Freq: Two times a day (BID) | ORAL | 0 refills | Status: DC

## 2017-08-28 NOTE — Plan of Care (Signed)
  Problem: Consults Goal: RH STROKE PATIENT EDUCATION Description See Patient Education module for education specifics  Outcome: Progressing   Problem: RH BOWEL ELIMINATION Goal: RH STG MANAGE BOWEL WITH ASSISTANCE Description STG Manage Bowel with mod I Assistance.  Outcome: Progressing Goal: RH STG MANAGE BOWEL W/MEDICATION W/ASSISTANCE Description STG Manage Bowel with Medication with mod I  Assistance.  Outcome: Progressing   Problem: RH SKIN INTEGRITY Goal: RH STG SKIN FREE OF INFECTION/BREAKDOWN Description Patients skin will remain free from further infection or breakdown with mod I assist.  Outcome: Progressing   Problem: RH SAFETY Goal: RH STG ADHERE TO SAFETY PRECAUTIONS W/ASSISTANCE/DEVICE Description STG Adhere to Safety Precautions With mod I Assistance/Device.  Outcome: Progressing Goal: RH STG DEMO UNDERSTANDING HOME SAFETY PRECAUTIONS Outcome: Progressing Goal: RH OTHER STG SAFETY GOALS W/ASSIST Description Other STG Safety Goals With Assistance. Outcome: Progressing   Problem: RH PAIN MANAGEMENT Goal: RH STG PAIN MANAGED AT OR BELOW PT'S PAIN GOAL Description < 3  Outcome: Progressing   Problem: RH KNOWLEDGE DEFICIT Goal: RH STG INCREASE KNOWLEDGE OF HYPERTENSION Description With mod I assist.  Outcome: Progressing

## 2017-08-28 NOTE — Progress Notes (Signed)
Subjective/Complaints:  Increased gabapentin yesterday , no drowsiness while ambulating with PT  ROS: Patient denies  cough, shortness of breath or chest pain, joint or back pain, headache, or mood change.    Objective: Vital Signs: Blood pressure 120/87, pulse 92, temperature 97.7 F (36.5 C), temperature source Oral, resp. rate 20, weight 96.3 kg (212 lb 4.9 oz), SpO2 97 %. No results found. Results for orders placed or performed during the hospital encounter of 08/20/17 (from the past 72 hour(s))  Basic metabolic panel     Status: None   Collection Time: 08/26/17  6:52 AM  Result Value Ref Range   Sodium 140 135 - 145 mmol/L   Potassium 3.6 3.5 - 5.1 mmol/L   Chloride 104 101 - 111 mmol/L   CO2 25 22 - 32 mmol/L   Glucose, Bld 76 65 - 99 mg/dL   BUN 17 6 - 20 mg/dL   Creatinine, Ser 1.01 0.61 - 1.24 mg/dL   Calcium 9.1 8.9 - 10.3 mg/dL   GFR calc non Af Amer >60 >60 mL/min   GFR calc Af Amer >60 >60 mL/min    Comment: (NOTE) The eGFR has been calculated using the CKD EPI equation. This calculation has not been validated in all clinical situations. eGFR's persistently <60 mL/min signify possible Chronic Kidney Disease.    Anion gap 11 5 - 15    Comment: Performed at [facility] [address] [place], [place] [address]  CBC     Status: Abnormal   Collection Time: 08/26/17  6:52 AM  Result Value Ref Range   WBC 10.6 (H) 4.0 - 10.5 K/uL   RBC 5.04 4.22 - 5.81 MIL/uL   Hemoglobin 14.7 13.0 - 17.0 g/dL   HCT 45.4 39.0 - 52.0 %   MCV 90.1 78.0 - 100.0 fL   MCH 29.2 26.0 - 34.0 pg   MCHC 32.4 30.0 - 36.0 g/dL   RDW 12.9 11.5 - 15.5 %   Platelets 199 150 - 400 K/uL    Comment: Performed at [facility], [place] [address], [place], [place] [address]     Constitutional: No distress . Vital signs reviewed. Gait with Walker, mild foot drag  Ataxic/ dec FMC and speech dysarthric Musc/Skel:  No pain with LE wt bearing Gen NAD Mood/affect  approp   Assessment/Plan: 1. Functional deficits secondary to Left pontine infarct with right hemiparesis which require 3+ hours per day of interdisciplinary therapy in a comprehensive inpatient rehab setting. Physiatrist is providing close team supervision and 24 hour management of active medical problems listed below. Physiatrist and rehab team continue to assess barriers to discharge/monitor patient progress toward functional and medical goals. FIM: Function - Bathing Position: Shower Body parts bathed by patient: Right arm, Left arm, Chest, Abdomen, Front perineal area, Buttocks, Right upper leg, Left upper leg, Right lower leg, Left lower leg, Back Assist Level: Supervision or verbal cues  Function- Upper Body Dressing/Undressing What is the patient wearing?: Pull over shirt/dress Pull over shirt/dress - Perfomed by patient: Thread/unthread right sleeve, Thread/unthread left sleeve, Pull shirt over trunk, Put head through opening Assist Level: No help, No cues Function - Lower Body Dressing/Undressing What is the patient wearing?: Pants, Socks, Shoes, Underwear Position: Wheelchair/chair at [employer] - Performed by patient: Thread/unthread right underwear leg, Thread/unthread left underwear leg, Pull underwear up/down Pants- Performed by patient: Thread/unthread right pants leg, Thread/unthread left pants leg, Pull pants up/down Pants- Performed by helper: Pull pants up/down Socks -  Performed by patient: Don/doff right sock, Don/doff left sock Shoes - Performed by patient: Don/doff right shoe, Don/doff left shoe, Fasten right, Fasten left Shoes - Performed by helper: Don/doff right shoe, Fasten right, Fasten left Assist for footwear: Setup Assist for lower body dressing: Supervision or verbal cues  Function - Toileting Toileting steps completed by patient: Performs perineal hygiene, Adjust clothing prior to toileting, Adjust clothing after toileting Toileting steps completed  by helper: Adjust clothing prior to toileting, Performs perineal hygiene, Adjust clothing after toileting Toileting Assistive Devices: Grab bar or rail Assist level: Supervision or verbal cues  Function - Air cabin crew transfer assistive device: Nurse, mental health Assist level to toilet: Supervision or verbal cues Assist level from toilet: Supervision or verbal cues  Function - Chair/bed transfer Chair/bed transfer method: Stand pivot, Ambulatory Chair/bed transfer assist level: Supervision or verbal cues Chair/bed transfer assistive device: Armrests, Cane Chair/bed transfer details: Verbal cues for safe use of DME/AE, Verbal cues for precautions/safety, Verbal cues for technique  Function - Locomotion: Wheelchair Will patient use wheelchair at discharge?: (TBD) Type: Manual Max wheelchair distance: 200' Assist Level: Supervision or verbal cues Assist Level: Supervision or verbal cues Assist Level: Supervision or verbal cues Turns around,maneuvers to table,bed, and toilet,negotiates 3% grade,maneuvers on rugs and over doorsills: No Function - Locomotion: Ambulation Assistive device: Cane-straight Max distance: >500' Assist level: Supervision or verbal cues Assist level: Supervision or verbal cues Assist level: Supervision or verbal cues Assist level: Supervision or verbal cues Walk 10 feet on uneven surfaces activity did not occur: Safety/medical concerns Assist level: Touching or steadying assistance (Pt > 75%)  Function - Comprehension Comprehension: Auditory Comprehension assist level: Follows complex conversation/direction with no assist  Function - Expression Expression: Verbal Expression assist level: Expresses complex ideas: With no assist  Function - Social Interaction Social Interaction assist level: Interacts appropriately with others with medication or extra time (anti-anxiety, antidepressant).  Function - Problem Solving Problem solving assist level:  Solves complex problems: With extra time  Function - Memory Memory assist level: More than reasonable amount of time Patient normally able to recall (first 3 days only): Current season, Location of own room, Staff names and faces, That he or she is in a hospital  Medical Problem List and Plan: 1. Right hemiparesis, dysarthria, dysmetria, and gait disturbance secondary to Left pontine infarct   -continue PT, OT, SLP plan d/c on 5/18 after MD round, will get D/C instructions today     ,2. DVT Prophylaxis/Anticoagulation: Pharmaceutical:Lovenox PLT and Hgb  nl  3.DDD/Pain Management: Voltaren gel to neck/shoulders. Tylenol prn, cervicalgia due to DDD,may incorporate some treatment in PT/OT but as discussed with pt CVA deficits are more functionally relevant- no NSAIDs, order Kpad CVA  Increase  Gabapentin to 300 BID 4. Mood:Continue klonopin prn for anxiety.   -team to provide ego support as possible 5. Neuropsych: This patientiscapable of making decisions on hisown behalf. 6. Skin/Wound Care:routine pressure relief measures 7. Fluids/Electrolytes/Nutrition:encourage PO normal BMET 5/15 8. HTN:  Vitals:   08/27/17 1442 08/28/17 0626  BP: (!) 130/106 120/87  Pulse: 80 92  Resp:  20  Temp: 98 F (36.7 C) 97.7 F (36.5 C)  SpO2: 97% 97%   controlled this am , some lability yesterday pm       9. Dyslipidemia: now on Lipitor.  10. BPH?: continue Proscar and Flomax    LOS (Days) 8 A FACE TO FACE EVALUATION WAS PERFORMED  Charlett Blake 08/28/2017, 10:10 AM

## 2017-08-28 NOTE — Discharge Instructions (Signed)
Inpatient Rehab Discharge Instructions  [facility] Discharge date and time:  08/28/17  Activities/Precautions/ Functional Status: Activity: no lifting, driving, or strenuous exercise till cleared by MD Diet: cardiac diet Wound Care: none needed   Functional status:  ___ No restrictions     ___ Walk up steps independently ___ 24/7 supervision/assistance   ___ Walk up steps with assistance _X__ Intermittent supervision/assistance  ___ Bathe/dress independently ___ Walk with walker     ___ Bathe/dress with assistance ___ Walk Independently    ___ Shower independently _X__ Walk with supervision    _X__ Shower with supervision.  _X__ No alcohol     ___ Return to work/school ________   Special Instructions:   COMMUNITY REFERRALS UPON DISCHARGE:    Outpatient: PT & OT  Agency:[facility] Phone:[phone]   Date of Last Service:08/28/2017  Appointment Date/Time:MAY 21 Tuesday 10:45-12:30 PM BOTH PT & OT SESSIONS  Medical Equipment/Items Ordered:NO NEEDS-HAS FROM OTHER ADMISSIONS    GENERAL COMMUNITY RESOURCES FOR PATIENT/FAMILY: Support Groups:[facility] SUPPORT GROUP  EVERY SECOND Thursday @ 3:00-4:00 PM ON THE REHAB UNIT (SEPT-MAY) QUESTIONS CONTACT [facility] [phone]    STROKE/TIA DISCHARGE INSTRUCTIONS SMOKING Cigarette smoking nearly doubles your risk of having a stroke & is the single most alterable risk factor  If you smoke or have smoked in the last 12 months, you are advised to quit smoking for your health.  Most of the excess cardiovascular risk related to smoking disappears within a year of stopping.  Ask you doctor about anti-smoking medications  Box Quit Line: 1-800-QUIT NOW  Free Smoking Cessation Classes [phone]  CHOLESTEROL Know your levels; limit fat & cholesterol in your diet  Lipid Panel     Component Value Date/Time   CHOL 208 (H) 08/19/2017 0627   TRIG 349 (H) 08/19/2017 0627   HDL 29 (L) 08/19/2017 0627   CHOLHDL 7.2  08/19/2017 0627   VLDL 70 (H) 08/19/2017 0627   LDLCALC 109 (H) 08/19/2017 5732      Many patients benefit from treatment even if their cholesterol is at goal.  Goal: Total Cholesterol (CHOL) less than 160  Goal:  Triglycerides (TRIG) less than 150  Goal:  HDL greater than 40  Goal:  LDL (LDLCALC) less than 100   BLOOD PRESSURE American Stroke Association blood pressure target is less that 120/80 mm/Hg  Your discharge blood pressure is:  BP: 120/87  Monitor your blood pressure  Limit your salt and alcohol intake  Many individuals will require more than one medication for high blood pressure  DIABETES (A1c is a blood sugar average for last 3 months) Goal HGBA1c is under 7% (HBGA1c is blood sugar average for last 3 months)  Diabetes: No known diagnosis of diabetes    Lab Results  Component Value Date   HGBA1C 5.6 08/19/2017     Your HGBA1c can be lowered with medications, healthy diet, and exercise.  Check your blood sugar as directed by your physician  Call your physician if you experience unexplained or low blood sugars.  PHYSICAL ACTIVITY/REHABILITATION Goal is 30 minutes at least 4 days per week  Activity: No driving, Therapies: see above  Return to work: to be decided on follow up  Activity decreases your risk of heart attack and stroke and makes your heart stronger.  It helps control your weight and blood pressure; helps you relax and can improve your mood.  Participate in a regular exercise program.  Talk with your doctor about the best form of exercise  for you (dancing, walking, swimming, cycling).  DIET/WEIGHT Goal is to maintain a healthy weight  Your discharge diet is:  Diet Order           Diet Heart Room service appropriate? Yes; Fluid consistency: Thin  Diet effective now          liquids Your height is:  6'0" Your current weight is: 212 lbs Your Body Mass Index (BMI) is:    Following the type of diet specifically designed for you will help prevent  another stroke.  Your goal weight is:    Your goal Body Mass Index (BMI) is 19-24.  Healthy food habits can help reduce 3 risk factors for stroke:  High cholesterol, hypertension, and excess weight.  RESOURCES Stroke/Support Group:  Call [phone]   STROKE EDUCATION PROVIDED/REVIEWED AND GIVEN TO PATIENT Stroke warning signs and symptoms How to activate emergency medical system (call 911). Medications prescribed at discharge. Need for follow-up after discharge. Personal risk factors for stroke. Pneumonia vaccine given:  Flu vaccine given:  My questions have been answered, the writing is legible, and I understand these instructions.  I will adhere to these goals & educational materials that have been provided to me after my discharge from the hospital.     My questions have been answered and I understand these instructions. I will adhere to these goals and the provided educational materials after my discharge from the hospital.  Patient/Caregiver Signature _______________________________ Date __________  Clinician Signature _______________________________________ Date __________  Please bring this form and your medication list with you to all your follow-up doctor's appointments.

## 2017-08-28 NOTE — Progress Notes (Signed)
Occupational Therapy Discharge Summary  Patient Details  Name: Riley Hartman MRN: [id number] Date of Birth: 10/10/1960  Today's Date: 08/28/2017 OT Individual Time: 4825-0037 OT Individual Time Calculation (min): 74 min   Pt greeted sitting in wc and agreeable to OT treatment session. Pt bathed and dressed with overall supervision 2/2 impulsiveness and balance. Pt then ambulated to therapy gym with [facility] with close supervision and intermittent CGA for lateral LOB. Treatment provided to address R fine motor coordination. Utilized small beads and wire for beading task, graded peg board activity, and reviewed home hand exercises using thera-putty. Pt returned to room in similar fashion and left seated in wc with chair alarm on and needs met.    Patient has met 10 of 10 long term goals due to improved activity tolerance, improved balance, postural control, ability to compensate for deficits, functional use of  RIGHT upper and RIGHT lower extremity and improved coordination.  Patient to discharge at overall Supervision level.  Patient's care partner is independent to provide the necessary physical assistance at discharge.    Reasons goals not met: n/a  Recommendation:  Patient will benefit from ongoing skilled OT services in outpatient setting to continue to advance functional skills in the area of BADL and functional use of R UE.  Equipment: No equipment provided  Reasons for discharge: treatment goals met and discharge from hospital  Patient/family agrees with progress made and goals achieved: Yes  OT Discharge Precautions/Restrictions  Precautions Precautions: Fall Restrictions Weight Bearing Restrictions: No Pain Pain Assessment Pain Scale: 0-10 Pain Score: 0-No pain ADL ADL ADL Comments: Please see functional navigator Vision Baseline Vision/History: No visual deficits Wears Glasses: At all times Patient Visual Report: No change from baseline Eye Alignment: Within Functional  Limits Perception  Perception: Within Functional Limits Praxis Praxis: Intact Cognition Overall Cognitive Status: Within Functional Limits for tasks assessed Arousal/Alertness: Awake/alert Orientation Level: Oriented X4 Memory: Appears intact Awareness: Appears intact Problem Solving: Appears intact Behaviors: Impulsive Safety/Judgment: Appears intact Comments: Mildly impulsive, although appears he is at baseline Sensation Sensation Light Touch: Impaired by gross assessment(diminished sensation throughout RLE) Light Touch Impaired Details: Impaired RUE;Impaired RLE Hot/Cold: Appears Intact Proprioception: Impaired by gross assessment(impaired RLE) Coordination Gross Motor Movements are Fluid and Coordinated: Yes Fine Motor Movements are Fluid and Coordinated: Yes Coordination and Movement Description: Decreased accuracy adn smoothness- improved since eval Motor  Motor Motor: Hemiplegia Motor - Discharge Observations: Mild R hemi Mobility  Bed Mobility Bed Mobility: Rolling Right;Rolling Left;Supine to Sit;Sit to Supine Rolling Right: 6: Modified independent (Device/Increase time) Rolling Left: 6: Modified independent (Device/Increase time) Supine to Sit: 6: Modified independent (Device/Increase time) Sit to Supine: 6: Modified independent (Device/Increase time) Transfers Sit to Stand: 6: Modified independent (Device/Increase time) Stand to Sit: 6: Modified independent (Device/Increase time)  Trunk/Postural Assessment  Cervical Assessment Cervical Assessment: Within Functional Limits Thoracic Assessment Thoracic Assessment: Within Functional Limits Lumbar Assessment Lumbar Assessment: Within Functional Limits Postural Control Postural Control: Within Functional Limits  Balance Balance Balance Assessed: Yes Static Sitting Balance Static Sitting - Balance Support: No upper extremity supported;Feet supported Static Sitting - Level of Assistance: 6: Modified  independent (Device/Increase time) Dynamic Sitting Balance Dynamic Sitting - Balance Support: No upper extremity supported;Feet supported Dynamic Sitting - Level of Assistance: 6: Modified independent (Device/Increase time) Static Standing Balance Static Standing - Balance Support: During functional activity Static Standing - Level of Assistance: 6: Modified independent (Device/Increase time) Dynamic Standing Balance Dynamic Standing - Balance Support: During functional activity Dynamic Standing -  Level of Assistance: 5: Stand by assistance Extremity/Trunk Assessment RUE Assessment RUE Assessment: Exceptions to [facility] RUE Strength RUE Overall Strength: Deficits RUE Overall Strength Comments: grossly 4/5- improved grip and pinch strength LUE Assessment LUE Assessment: Within Functional Limits   See Function Navigator for Current Functional Status.  Daneen Schick Doe 08/28/2017, 12:48 PM

## 2017-08-29 NOTE — Progress Notes (Signed)
  Subjective/Complaints:  Feels well, eager to go home Objective: Vital Signs: Blood pressure 134/84, pulse 75, temperature (!) 97.5 F (36.4 C), temperature source Oral, resp. rate 18, weight 212 lb 4.9 oz (96.3 kg), SpO2 96 %.  Well-developed well-nourished male in no acute distress.  He sitting in a wheelchair.  He has made himself a cup of coffee.  Neck is supple without lymphadenopathy or thyromegaly.  HEENT exam atraumatic, normocephalic  Chest clear to auscultation Cardiac exam S1-S2 regular Abdominal exam active bowel sounds, soft Extremities no edema  Assessment/Plan: 1. Functional deficits secondary to Left pontine infarct with right hemiparesis Medical Problem List and Plan: 1.  Left pontine infarct resulting in right hemiparesis, dysarthria, dysmetria and gait disturbance  Patient has discharge instruction and is eager to go home.    ,2. DVT Prophylaxis/Anticoagulation: Going home today. 3.DDD/Pain Management: Seems to be doing well on current medication therapy. 4. Mood:Continue klonopin prn for anxiety.   -team to provide ego support as possible 5. Neuropsych: This patientiscapable of making decisions on hisown behalf. 6. Skin/Wound Care:routine pressure relief measures 7. Fluids/Electrolytes/Nutrition:encourage PO normal BMET 5/15 8. HTN: Well-controlled. Vitals:   08/28/17 2156 08/29/17 0533  BP: 133/90 134/84  Pulse: 75 75  Resp:  18  Temp: 98.4 F (36.9 C) (!) 97.5 F (36.4 C)  SpO2:  96%         9. Dyslipidemia: now on Lipitor.  10. BPH?: continue Proscar and Flomax    LOS (Days) 9 A FACE TO FACE EVALUATION WAS PERFORMED  Johann Santone H Azjah Pardo 08/29/2017, 6:41 AM

## 2017-08-29 NOTE — Progress Notes (Signed)
Pt A&O x 4. No c/o pain. OOB in [place]. Belongings packed and ready to DC. Sister picking pt up. DC summary completed yesterday by PA. No concerns or complaints noted.

## 2017-08-29 NOTE — Plan of Care (Signed)
  Problem: Consults Goal: RH STROKE PATIENT EDUCATION Description See Patient Education module for education specifics  Outcome: Completed/Met   Problem: RH BOWEL ELIMINATION Goal: RH STG MANAGE BOWEL WITH ASSISTANCE Description STG Manage Bowel with mod I Assistance.  Outcome: Completed/Met   Problem: RH BOWEL ELIMINATION Goal: RH STG MANAGE BOWEL W/MEDICATION W/ASSISTANCE Description STG Manage Bowel with Medication with mod I  Assistance.  Outcome: Completed/Met   Problem: RH SKIN INTEGRITY Goal: RH STG SKIN FREE OF INFECTION/BREAKDOWN Description Patients skin will remain free from further infection or breakdown with mod I assist.  Outcome: Completed/Met   Problem: RH SAFETY Goal: RH STG ADHERE TO SAFETY PRECAUTIONS W/ASSISTANCE/DEVICE Description STG Adhere to Safety Precautions With mod I Assistance/Device.  Outcome: Completed/Met   Problem: RH SAFETY Goal: RH STG DEMO UNDERSTANDING HOME SAFETY PRECAUTIONS Outcome: Completed/Met   Problem: RH SAFETY Goal: RH OTHER STG SAFETY GOALS W/ASSIST Description Other STG Safety Goals With Assistance. Outcome: Completed/Met   Problem: RH PAIN MANAGEMENT Goal: RH STG PAIN MANAGED AT OR BELOW PT'S PAIN GOAL Description < 3  Outcome: Completed/Met   Problem: RH KNOWLEDGE DEFICIT Goal: RH STG INCREASE KNOWLEDGE OF HYPERTENSION Description With mod I assist.  Outcome: Completed/Met

## 2017-08-31 ENCOUNTER — Emergency Department (HOSPITAL_COMMUNITY)
Admission: EM | Admit: 2017-08-31 | Discharge: 2017-08-31 | Disposition: A | Discharge status: Home / Self Care | Payer: BLUE CROSS/BLUE SHIELD | Attending: Emergency Medicine | Admitting: Emergency Medicine

## 2017-08-31 ENCOUNTER — Encounter (HOSPITAL_COMMUNITY): Payer: Self-pay | Admitting: Emergency Medicine

## 2017-08-31 ENCOUNTER — Other Ambulatory Visit: Payer: Self-pay

## 2017-08-31 ENCOUNTER — Emergency Department (HOSPITAL_COMMUNITY): Payer: BLUE CROSS/BLUE SHIELD

## 2017-08-31 DIAGNOSIS — I2510 Atherosclerotic heart disease of native coronary artery without angina pectoris: Secondary | ICD-10-CM | POA: Insufficient documentation

## 2017-08-31 DIAGNOSIS — Z79899 Other long term (current) drug therapy: Secondary | ICD-10-CM | POA: Insufficient documentation

## 2017-08-31 DIAGNOSIS — F17210 Nicotine dependence, cigarettes, uncomplicated: Secondary | ICD-10-CM | POA: Insufficient documentation

## 2017-08-31 DIAGNOSIS — R200 Anesthesia of skin: Secondary | ICD-10-CM

## 2017-08-31 DIAGNOSIS — F909 Attention-deficit hyperactivity disorder, unspecified type: Secondary | ICD-10-CM | POA: Diagnosis not present

## 2017-08-31 DIAGNOSIS — I110 Hypertensive heart disease with heart failure: Secondary | ICD-10-CM | POA: Diagnosis not present

## 2017-08-31 DIAGNOSIS — I5030 Unspecified diastolic (congestive) heart failure: Secondary | ICD-10-CM | POA: Insufficient documentation

## 2017-08-31 DIAGNOSIS — Z7901 Long term (current) use of anticoagulants: Secondary | ICD-10-CM | POA: Diagnosis not present

## 2017-08-31 DIAGNOSIS — R29818 Other symptoms and signs involving the nervous system: Secondary | ICD-10-CM | POA: Diagnosis not present

## 2017-08-31 DIAGNOSIS — R531 Weakness: Secondary | ICD-10-CM | POA: Diagnosis not present

## 2017-08-31 LAB — APTT: aPTT: 28 seconds (ref 24–36)

## 2017-08-31 LAB — I-STAT CHEM 8, ED
BUN: 13 mg/dL (ref 6–20)
Calcium, Ion: 1.2 mmol/L (ref 1.15–1.40)
Chloride: 103 mmol/L (ref 101–111)
Creatinine, Ser: 0.7 mg/dL (ref 0.61–1.24)
Glucose, Bld: 108 mg/dL — ABNORMAL HIGH (ref 65–99)
HCT: 40 % (ref 39.0–52.0)
Hemoglobin: 13.6 g/dL (ref 13.0–17.0)
Potassium: 3.7 mmol/L (ref 3.5–5.1)
Sodium: 143 mmol/L (ref 135–145)
TCO2: 26 mmol/L (ref 22–32)

## 2017-08-31 LAB — CBC
HCT: 41.5 % (ref 39.0–52.0)
Hemoglobin: 13.7 g/dL (ref 13.0–17.0)
MCH: 29.3 pg (ref 26.0–34.0)
MCHC: 33 g/dL (ref 30.0–36.0)
MCV: 88.7 fL (ref 78.0–100.0)
Platelets: 201 10*3/uL (ref 150–400)
RBC: 4.68 MIL/uL (ref 4.22–5.81)
RDW: 12.4 % (ref 11.5–15.5)
WBC: 9.1 10*3/uL (ref 4.0–10.5)

## 2017-08-31 LAB — DIFFERENTIAL
Abs Immature Granulocytes: 0 10*3/uL (ref 0.0–0.1)
Basophils Absolute: 0.1 10*3/uL (ref 0.0–0.1)
Basophils Relative: 1 %
Eosinophils Absolute: 0.3 10*3/uL (ref 0.0–0.7)
Eosinophils Relative: 3 %
Immature Granulocytes: 0 %
Lymphocytes Relative: 30 %
Lymphs Abs: 2.7 10*3/uL (ref 0.7–4.0)
Monocytes Absolute: 0.7 10*3/uL (ref 0.1–1.0)
Monocytes Relative: 8 %
Neutro Abs: 5.3 10*3/uL (ref 1.7–7.7)
Neutrophils Relative %: 58 %

## 2017-08-31 LAB — COMPREHENSIVE METABOLIC PANEL
ALT: 38 U/L (ref 17–63)
AST: 25 U/L (ref 15–41)
Albumin: 4.2 g/dL (ref 3.5–5.0)
Alkaline Phosphatase: 60 U/L (ref 38–126)
Anion gap: 8 (ref 5–15)
BUN: 10 mg/dL (ref 6–20)
CO2: 27 mmol/L (ref 22–32)
Calcium: 9.2 mg/dL (ref 8.9–10.3)
Chloride: 107 mmol/L (ref 101–111)
Creatinine, Ser: 0.81 mg/dL (ref 0.61–1.24)
GFR calc Af Amer: >60 mL/min (ref >60)
GFR calc non Af Amer: >60 mL/min (ref >60)
Glucose, Bld: 109 mg/dL — ABNORMAL HIGH (ref 65–99)
Potassium: 3.7 mmol/L (ref 3.5–5.1)
Sodium: 142 mmol/L (ref 135–145)
Total Bilirubin: 0.6 mg/dL (ref 0.3–1.2)
Total Protein: 7.1 g/dL (ref 6.5–8.1)

## 2017-08-31 LAB — I-STAT TROPONIN, ED: Troponin i, poc: 0 ng/mL (ref 0.00–0.08)

## 2017-08-31 LAB — PROTIME-INR
INR: 1.02
Prothrombin Time: 13.3 seconds (ref 11.4–15.2)

## 2017-08-31 NOTE — ED Notes (Signed)
Pt ambulated with walker in hallway with no assistance, steady gait noted; pt reports no dizziness or lightheadedness, states he feels like his normal gait since the stroke

## 2017-08-31 NOTE — ED Provider Notes (Signed)
Patient placed in Quick Look pathway, seen and evaluated   Chief Complaint: Right arm weakness and slurred speech  HPI:   Patient states he had recent stroke, states that he was driving a golf cart today and started having weakness in the right arm.  This started approximately 30 minutes ago.  He states similar symptoms to prior stroke.  Came here for further evaluation.  States he is also getting slurred speech as he is talking to me right now.  ROS: Positive for right arm weakness.  Positive for slurred speech.  Physical Exam:   Gen: No distress  Neuro: Awake and Alert  Skin: Warm    Focused Exam: Speech is slurred.  Cranial nerves grossly intact otherwise.  5 out of 5 and equal bilateral grip strength, bicep, tricep, deltoid strength.  No pronator drift.  Bilateral lower extremity strength is intact.  Did not assess the gait. Normal finger to nose   Initiation of care has begun. The patient has been counseled on the process, plan, and necessity for staying for the completion/evaluation, and the remainder of the medical screening examination   Patient with subjective right arm weakness, however slurred speech which she states is new that started 30 minutes ago.  Will initiate code stroke.  Patient was recently admitted, just 2 weeks ago for acute stroke and states symptoms are similar.  Vitals:   08/31/17 1603 08/31/17 1606  BP: (!) 159/98   Pulse: 85   Resp: 18   Temp:  98 F (36.7 C)  TempSrc:  Oral  SpO2: 98%       Jeannett Senior, PA-C 08/31/17 1616    Mesner, Corene Cornea, MD 09/01/17 0006
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
## 2017-08-31 NOTE — ED Notes (Signed)
Code Stroke activated @ [phone]

## 2017-08-31 NOTE — ED Notes (Signed)
Pt reporting worsening R arm weakness and decreased sensation that occurred 40 mins PTA; pt states he was recently discharged from the hospital for CVA 2 weeks ago; pt states he was concerned he was having another stroke; Aroor, MD at bedside Pt is reporting no new symptoms that are different from his prior CVA, just intermittent worsening of his prior CVA symptoms

## 2017-08-31 NOTE — ED Provider Notes (Signed)
I saw and evaluated the patient, reviewed the resident's note and I agree with the findings and plan with the following exceptions.   Activated code stroke secondary to worsening symptoms of his previous stroke couple weeks ago.  Apparently his slurred speech is worsening and his numbness was worse and as well.  CT did not show any changes.  Neurology evaluated and felt that this may just have been a worsening of his previous stroke but did not feel that he had had a new stroke or any other concerns since his symptoms and sniffily improved.  At the time of my evaluation patient was ambulating through the hall without much difficulty compared to his baseline.  He did have some gait of normality but while using the walker he was able to follow directions walk without any other assistance.  Slightly slurred speech no obvious cranial nerve abnormalities.  Neurology suggesting discharge home which I feel is appropriate as his symptoms are resolved and he is on maximal medical therapy.    EKG Interpretation  Date/Time:  Monday Aug 31 2017 16:32:24 EDT Ventricular Rate:  78 PR Interval:    QRS Duration: 152 QT Interval:  414 QTC Calculation: 472 R Axis:   -10 Text Interpretation:  Sinus rhythm Right bundle branch block Probable inferior infarct, old similar to earlier in month Confirmed by Merrily Pew [phone]) on 08/31/2017 5:27:25 PM         Jordie Schreur, Corene Cornea, MD 09/01/17 0006

## 2017-08-31 NOTE — Code Documentation (Signed)
Patient discharged 2 days ago from rehab after a stroke he had 2 weeks ago.  His symptoms had improved from admission.  This afternoon he felt a "pain, numbness, heaviness" on his right side.  He had difficulty finding his words for a few min.  He came to the hospital via private vehicle at 1557.  Code stroke called and stat labs and head CT done.  NIHSS 4, mild right facial droop,  Ataxia right arm and leg, mild decreased sensory on right.  Dr Aroor at bedside to assess patient, spoke with wife and patient. Plan:  Observe in ED for a few hours and reassessment.
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
## 2017-09-01 ENCOUNTER — Ambulatory Visit: Payer: BLUE CROSS/BLUE SHIELD | Admitting: Occupational Therapy

## 2017-09-01 ENCOUNTER — Other Ambulatory Visit: Payer: Self-pay

## 2017-09-01 ENCOUNTER — Encounter: Payer: Self-pay | Admitting: Physical Therapy

## 2017-09-01 ENCOUNTER — Ambulatory Visit: Payer: BLUE CROSS/BLUE SHIELD | Attending: Physical Medicine & Rehabilitation | Admitting: Physical Therapy

## 2017-09-01 VITALS — BP 156/83 | HR 88

## 2017-09-01 DIAGNOSIS — R2689 Other abnormalities of gait and mobility: Secondary | ICD-10-CM

## 2017-09-01 DIAGNOSIS — I69351 Hemiplegia and hemiparesis following cerebral infarction affecting right dominant side: Secondary | ICD-10-CM | POA: Diagnosis not present

## 2017-09-01 DIAGNOSIS — M6281 Muscle weakness (generalized): Secondary | ICD-10-CM | POA: Insufficient documentation

## 2017-09-01 DIAGNOSIS — R2681 Unsteadiness on feet: Secondary | ICD-10-CM | POA: Diagnosis not present

## 2017-09-01 DIAGNOSIS — R278 Other lack of coordination: Secondary | ICD-10-CM | POA: Insufficient documentation

## 2017-09-01 DIAGNOSIS — R260 Ataxic gait: Secondary | ICD-10-CM | POA: Insufficient documentation

## 2017-09-01 DIAGNOSIS — R208 Other disturbances of skin sensation: Secondary | ICD-10-CM

## 2017-09-01 DIAGNOSIS — I69353 Hemiplegia and hemiparesis following cerebral infarction affecting right non-dominant side: Secondary | ICD-10-CM | POA: Diagnosis not present
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
## 2017-09-02 ENCOUNTER — Inpatient Hospital Stay: Payer: BLUE CROSS/BLUE SHIELD | Admitting: Family Medicine

## 2017-09-02 ENCOUNTER — Ambulatory Visit: Payer: BLUE CROSS/BLUE SHIELD | Admitting: Family Medicine

## 2017-09-02 ENCOUNTER — Encounter: Payer: Self-pay | Admitting: Family Medicine

## 2017-09-02 VITALS — BP 138/80 | Temp 98.0°F | Ht 72.0 in | Wt 209.2 lb

## 2017-09-02 DIAGNOSIS — I69322 Dysarthria following cerebral infarction: Secondary | ICD-10-CM | POA: Diagnosis not present

## 2017-09-02 DIAGNOSIS — R269 Unspecified abnormalities of gait and mobility: Secondary | ICD-10-CM

## 2017-09-02 DIAGNOSIS — I69398 Other sequelae of cerebral infarction: Secondary | ICD-10-CM | POA: Diagnosis not present

## 2017-09-02 DIAGNOSIS — I6330 Cerebral infarction due to thrombosis of unspecified cerebral artery: Secondary | ICD-10-CM

## 2017-09-02 DIAGNOSIS — G8191 Hemiplegia, unspecified affecting right dominant side: Secondary | ICD-10-CM

## 2017-09-02 DIAGNOSIS — I10 Essential (primary) hypertension: Secondary | ICD-10-CM

## 2017-09-02 MED ORDER — LISINOPRIL 20 MG PO TABS: 20.0000 mg | ORAL_TABLET | Freq: Every day | ORAL | 3 refills | Status: DC

## 2017-09-02 MED ORDER — GABAPENTIN 300 MG PO CAPS: 300.0000 mg | ORAL_CAPSULE | Freq: Three times a day (TID) | ORAL | 0 refills | Status: DC

## 2017-09-02 NOTE — Progress Notes (Signed)
   Subjective:    Patient ID: Riley Hartman, male    DOB: 01/23/1961, 57 y.o.   MRN: [id number]  HPI Here to follow up a recent stroke. He went to the ER on 08-18-17 with right sided arm and leg weakness and slurred speech. An MRI/MRA revealed an infarct in the left pons. He then stayed in rehab under the care of Dr. Alysia Penna from 08-20-17 to 08-29-17 getting PT, OT, and ST. He was started on a combination of aspirin and Plavix for 3 months per Neurology, and the plan is to stop one or the other of these at that point. He is now at home. He has completed ST, but PT and OT are ongoing. He has regained a lot of his function, and his speech is back to normal. He still has some residual right arm and leg weakness, and he walks with a cane. He describes some tingling and burning in the right arm and on the right side of the face, and for this he was started on Gabapentin 300 mg BID. While in the hospital he was taken off Lisinopril to keep the BP sightly elevated to maximize cerebral perfusion. Over the past week his BP at home has run in the 140s and 150s mostly.    Review of Systems  Constitutional: Negative.   Respiratory: Negative.   Cardiovascular: Negative.   Genitourinary: Negative.   Neurological: Positive for weakness and numbness. Negative for dizziness, tremors, seizures, syncope, facial asymmetry, speech difficulty, light-headedness and headaches.       Objective:   Physical Exam  Constitutional: He is oriented to person, place, and time. He appears well-developed and well-nourished.  Cardiovascular: Normal rate, regular rhythm, normal heart sounds and intact distal pulses.  Pulmonary/Chest: Effort normal and breath sounds normal. No stridor. No respiratory distress. He has no wheezes. He has no rales.  Musculoskeletal: He exhibits no edema.  Neurological: He is alert and oriented to person, place, and time. No cranial nerve deficit. He exhibits normal muscle tone.  Grip strength in  the right hand appears normal. 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 He will stay on both aspirin and Plavix for now. His BP has been running high so we will start him back on Lisinopril 20 mg daily as before. For the neuropathy we will increase Gabapentin to 300 mg TID. He is due to follow up with Dr. Letta Pate on 09-24-17 and with [facility] on 09-29-17.  Alysia Penna, MD

## 2017-09-15 ENCOUNTER — Ambulatory Visit: Payer: BLUE CROSS/BLUE SHIELD | Attending: Physical Medicine & Rehabilitation | Admitting: Physical Therapy

## 2017-09-15 ENCOUNTER — Ambulatory Visit: Payer: BLUE CROSS/BLUE SHIELD | Admitting: Occupational Therapy

## 2017-09-15 ENCOUNTER — Encounter: Payer: Self-pay | Admitting: Physical Therapy

## 2017-09-15 VITALS — BP 146/88

## 2017-09-15 DIAGNOSIS — I69351 Hemiplegia and hemiparesis following cerebral infarction affecting right dominant side: Secondary | ICD-10-CM

## 2017-09-15 DIAGNOSIS — R208 Other disturbances of skin sensation: Secondary | ICD-10-CM | POA: Diagnosis not present

## 2017-09-15 DIAGNOSIS — R2689 Other abnormalities of gait and mobility: Secondary | ICD-10-CM | POA: Insufficient documentation

## 2017-09-15 DIAGNOSIS — I69353 Hemiplegia and hemiparesis following cerebral infarction affecting right non-dominant side: Secondary | ICD-10-CM | POA: Diagnosis not present

## 2017-09-15 DIAGNOSIS — M6281 Muscle weakness (generalized): Secondary | ICD-10-CM | POA: Diagnosis not present

## 2017-09-15 DIAGNOSIS — R278 Other lack of coordination: Secondary | ICD-10-CM

## 2017-09-15 DIAGNOSIS — R2681 Unsteadiness on feet: Secondary | ICD-10-CM

## 2017-09-15 NOTE — Patient Instructions (Signed)
  1. Grip Strengthening (Resistive Putty)   Squeeze putty using thumb and all fingers. Repeat _20___ times. Do __2__ sessions per day.   2. Roll putty into tube on table and pinch between each finger and thumb x 10 reps each, 2 sessions per day. (Do ring and small finger together)    Coordination Activities  Perform the following activities for 15 minutes 1-2 times per day with right hand(s).   Rotate ball in fingertips (clockwise and counter-clockwise x 3 to 5 full revolutions each way).  Toss ball in air and catch with the same hand.  Flip cards 1 at a time as fast as you can.  Deal cards with your thumb (Hold deck in hand and push card off top with thumb).  Rotate 1 card in hand (clockwise and counter-clockwise).  Pick up coins one at a time until you get 5 in your hand, then move coins from palm to fingertips to stack one at a time. Do 3 stacks of 5  Practice writing.  Screw together nuts and bolts, then unfasten.
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
## 2017-09-16 ENCOUNTER — Encounter: Payer: Self-pay | Admitting: Physical Therapy

## 2017-09-16 ENCOUNTER — Ambulatory Visit: Payer: BLUE CROSS/BLUE SHIELD | Attending: Family Medicine | Admitting: Physical Therapy

## 2017-09-16 ENCOUNTER — Other Ambulatory Visit: Payer: Self-pay

## 2017-09-16 DIAGNOSIS — M542 Cervicalgia: Secondary | ICD-10-CM | POA: Diagnosis not present
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
## 2017-09-16 NOTE — Patient Instructions (Signed)
Access Code: [email or web address]  URL: [email or web address]  Date: 09/16/2017  Prepared by: Earlie Counts   Exercises  Seated Cervical Retraction Extension Passive Loaded - 5 reps - 1 sets - 1 hold - 2x daily - 7x weekly  Seated Cervical Flexion AROM - 5 reps - 1 sets - 2 sec hold - 1x daily - 7x weekly  Standing Cervical Rotation AROM with Overpressure - 5 reps - 1 sets - 2 hold - 1x daily - 7x weekly  Seated Cervical Sidebending Stretch - 5 reps - 1 sets - 2 hold - 1x daily - 7x weekly  [facility] [address] [place], [place] [address] Phone # [phone] Fax [phone]

## 2017-09-17 ENCOUNTER — Encounter: Payer: Self-pay | Admitting: Physical Therapy

## 2017-09-17 ENCOUNTER — Ambulatory Visit: Payer: BLUE CROSS/BLUE SHIELD | Admitting: Physical Therapy

## 2017-09-17 ENCOUNTER — Ambulatory Visit: Payer: BLUE CROSS/BLUE SHIELD | Admitting: Occupational Therapy

## 2017-09-17 VITALS — BP 133/83 | HR 68

## 2017-09-17 DIAGNOSIS — R2681 Unsteadiness on feet: Secondary | ICD-10-CM | POA: Diagnosis not present

## 2017-09-17 DIAGNOSIS — I69353 Hemiplegia and hemiparesis following cerebral infarction affecting right non-dominant side: Secondary | ICD-10-CM | POA: Diagnosis not present

## 2017-09-17 DIAGNOSIS — R2689 Other abnormalities of gait and mobility: Secondary | ICD-10-CM | POA: Diagnosis not present

## 2017-09-17 DIAGNOSIS — M6281 Muscle weakness (generalized): Secondary | ICD-10-CM

## 2017-09-17 DIAGNOSIS — R278 Other lack of coordination: Secondary | ICD-10-CM | POA: Diagnosis not present

## 2017-09-17 DIAGNOSIS — R208 Other disturbances of skin sensation: Secondary | ICD-10-CM | POA: Diagnosis not present

## 2017-09-17 DIAGNOSIS — I69351 Hemiplegia and hemiparesis following cerebral infarction affecting right dominant side: Secondary | ICD-10-CM
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
## 2017-09-17 NOTE — Patient Instructions (Signed)
    Strengthening: Resisted Flexion   Hold tubing with __Right___ arm(s) at side. Pull forward and up. Move shoulder through pain-free range of motion. Repeat __10__ times per set.  Do _2_ sessions per day , every other day   Strengthening: Resisted Extension   Hold tubing in _BOTH___ hand(s), arm forward. Pull arm back, elbow straight. Repeat _10___ times per set. Do _2___ sessions per day, every other day.  Shoulder External Rotators    With right elbow bent 90 and held at side, move hand away from body, keeping elbow at side. Use tubing at door.  Keep head and back straight.  Repeat __10__ times. Do __2__ sessions per day, every other day. CAUTION: Move slowly.    Resisted Horizontal Abduction: Bilateral   Sit or stand, tubing in both hands, arms out in front. Keeping arms straight, pinch shoulder blades together and stretch arms out. Repeat _10___ times per set. Do _2___ sessions per day, every other day.   Elbow Flexion: Resisted   With tubing held in ___Rt___ hand(s) and other end secured under foot, curl arm up as far as possible. Repeat _10___ times per set. Do _2___ sessions per day, every other day.    Elbow Extension: Resisted   Sit in chair with resistive band held at top with Lt hand at Lt shoulder,  and ____Rt___ elbow bent, holding at bottom. Straighten Rt elbow. Repeat _10___ times per set.  Do _2___ sessions per day, every other day.

## 2017-09-17 NOTE — Patient Instructions (Signed)
Access Code: [email or web address]  URL: [email or web address]  Date: 09/17/2017  Prepared by: Barry Brunner   Exercises  Beginner Front Arm Support - 10 reps - 3 sets - 1x daily - 3x weekly  Tandem Walking with Counter Support - 10 reps - 1 sets - 1x daily - 5x weekly  Standing Hamstring Curl with Resistance - 10 reps - 3 sets - 3 sec hold - 1x daily - 5x weekly
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
## 2017-09-21 ENCOUNTER — Encounter: Payer: Self-pay | Admitting: *Deleted

## 2017-09-21 ENCOUNTER — Encounter: Payer: Self-pay | Admitting: Physical Therapy

## 2017-09-21 ENCOUNTER — Ambulatory Visit: Payer: BLUE CROSS/BLUE SHIELD | Admitting: *Deleted

## 2017-09-21 ENCOUNTER — Other Ambulatory Visit: Payer: Self-pay | Admitting: *Deleted

## 2017-09-21 ENCOUNTER — Ambulatory Visit: Payer: BLUE CROSS/BLUE SHIELD | Admitting: Physical Therapy

## 2017-09-21 DIAGNOSIS — M6281 Muscle weakness (generalized): Secondary | ICD-10-CM | POA: Diagnosis not present

## 2017-09-21 DIAGNOSIS — R208 Other disturbances of skin sensation: Secondary | ICD-10-CM

## 2017-09-21 DIAGNOSIS — R2681 Unsteadiness on feet: Secondary | ICD-10-CM | POA: Diagnosis not present

## 2017-09-21 DIAGNOSIS — R2689 Other abnormalities of gait and mobility: Secondary | ICD-10-CM

## 2017-09-21 DIAGNOSIS — I69353 Hemiplegia and hemiparesis following cerebral infarction affecting right non-dominant side: Secondary | ICD-10-CM

## 2017-09-21 DIAGNOSIS — I69351 Hemiplegia and hemiparesis following cerebral infarction affecting right dominant side: Secondary | ICD-10-CM

## 2017-09-21 DIAGNOSIS — R278 Other lack of coordination: Secondary | ICD-10-CM

## 2017-09-21 NOTE — Patient Outreach (Signed)
[name]) Care Management  09/21/2017  Riley Hartman [facility] 03-24-1961 [id number]  Referral via [facility] -EMMI-Stroke -Day #13 on 09/13/2017: Reason: Riley Hartman to follow up appointment -"no"  Late entry-Spoke with patient via telephone 09/18/2017. Advised of reason for call . HIPPA verification received from patient.   Patient confirms that he had recent stroke and was hospitalized. States he also has had previous stroke.  Voices that he has had follow up with his primary care provider on 5/22. States follow up with neurologist is 6/18. States he has transportation to appointments. Voices he has all of medications and is taking as prescribed consistently. States currently he is going to outpatient rehabilitative therapy sessions.  Patient was advised of importance of calling 911 or having spouse call for stroke symptoms. Patient voices understanding.   Patient voices no further healthcare concerns. EMMI red alert has been addressed.  Plan: Case closure.     Sherrin Daisy, RN BSN [place] Management Coordinator [facility] Care Management  [phone]

## 2017-09-21 NOTE — Patient Instructions (Signed)
Access Code: [email or web address]  URL: [email or web address]  Date: 09/21/2017  Prepared by: Barry Brunner   Exercises  Beginner Front Arm Support - 10 reps - 3 sets - 1x daily - 3x weekly  Tandem Walking with Counter Support - 10 reps - 1 sets - 1x daily - 5x weekly  Standing Hamstring Curl with Resistance - 10 reps - 3 sets - 3 sec hold - 1x daily - 5x weekly  Standing Single Leg Stance with Unilateral Counter Support - 3 reps - 1 sets - 10-30 seconds hold - 3x daily - 7x weekly  Standing Romberg to 1/4 Tandem Stance - 10 reps - 1 sets - 1x daily - 7x weekly
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
## 2017-09-22 ENCOUNTER — Encounter: Payer: Self-pay | Admitting: *Deleted

## 2017-09-22 ENCOUNTER — Ambulatory Visit: Payer: BLUE CROSS/BLUE SHIELD | Admitting: Physical Therapy

## 2017-09-22 ENCOUNTER — Ambulatory Visit: Payer: BLUE CROSS/BLUE SHIELD | Admitting: *Deleted

## 2017-09-22 ENCOUNTER — Encounter: Payer: Self-pay | Admitting: Physical Therapy

## 2017-09-22 DIAGNOSIS — I69351 Hemiplegia and hemiparesis following cerebral infarction affecting right dominant side: Secondary | ICD-10-CM | POA: Diagnosis not present

## 2017-09-22 DIAGNOSIS — R278 Other lack of coordination: Secondary | ICD-10-CM | POA: Diagnosis not present

## 2017-09-22 DIAGNOSIS — M542 Cervicalgia: Secondary | ICD-10-CM

## 2017-09-22 DIAGNOSIS — R2689 Other abnormalities of gait and mobility: Secondary | ICD-10-CM | POA: Diagnosis not present

## 2017-09-22 DIAGNOSIS — R208 Other disturbances of skin sensation: Secondary | ICD-10-CM | POA: Diagnosis not present

## 2017-09-22 DIAGNOSIS — R2681 Unsteadiness on feet: Secondary | ICD-10-CM | POA: Diagnosis not present

## 2017-09-22 DIAGNOSIS — M6281 Muscle weakness (generalized): Secondary | ICD-10-CM | POA: Diagnosis not present

## 2017-09-22 DIAGNOSIS — I69353 Hemiplegia and hemiparesis following cerebral infarction affecting right non-dominant side: Secondary | ICD-10-CM | POA: Diagnosis not present
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
## 2017-09-22 NOTE — Therapy (Signed)
[facility] [address] [place], [place], [address] Phone: [phone]   Fax:  [phone]  Patient Details  Name: BART ASHFORD MRN: [id number] Date of Birth: [birth year] Referring Provider:  Laurey Morale, MD  Encounter Date: 09/22/2017  Patient being checked in and mentioned he has another PT appointment today at another site to address his neck pain. Unable to confirm if insurance will cover 2 PT visits in one day, therefore chose to cancel his session with me today and he will go for his other PT appointment later today.   Awaiting return call from insurance specialist to be able to guide pt regarding his future appointments as they were all made on the same day.   Rexanne Mano, PT 09/22/2017, 9:13 AM  [facility] [address] [place], [place], [address] Phone: [phone]   Fax:  [phone]

## 2017-09-22 NOTE — Patient Instructions (Signed)
Access Code: [email or web address]  URL: [email or web address]  Date: 09/22/2017  Prepared by: Earlie Counts   Exercises  Seated Cervical Retraction Extension Passive Loaded - 5 reps - 1 sets - 1 hold - 2x daily - 7x weekly  Seated Cervical Flexion AROM - 5 reps - 1 sets - 2 sec hold - 1x daily - 7x weekly  Standing Cervical Rotation AROM with Overpressure - 5 reps - 1 sets - 2 hold - 1x daily - 7x weekly  Seated Cervical Sidebending Stretch - 5 reps - 1 sets - 2 hold - 1x daily - 7x weekly  Mid-Lower Cervical Extension SNAG with Strap - 5 reps - 1 sets - 1x daily - 7x weekly  Upper Cervical Rotation SNAG with Strap - 5 reps - 1 sets - 1x daily - 7x weekly  [facility] [address] [place], [place] [address] Phone # [phone] Fax [phone]
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
## 2017-09-24 ENCOUNTER — Other Ambulatory Visit: Payer: Self-pay

## 2017-09-24 ENCOUNTER — Ambulatory Visit (HOSPITAL_BASED_OUTPATIENT_CLINIC_OR_DEPARTMENT_OTHER): Payer: BLUE CROSS/BLUE SHIELD | Admitting: Physical Medicine & Rehabilitation

## 2017-09-24 ENCOUNTER — Encounter: Payer: BLUE CROSS/BLUE SHIELD | Attending: Physical Medicine & Rehabilitation

## 2017-09-24 ENCOUNTER — Encounter: Payer: Self-pay | Admitting: Physical Medicine & Rehabilitation

## 2017-09-24 VITALS — BP 119/73 | HR 69 | Ht 72.0 in | Wt 226.2 lb

## 2017-09-24 DIAGNOSIS — G4730 Sleep apnea, unspecified: Secondary | ICD-10-CM | POA: Insufficient documentation

## 2017-09-24 DIAGNOSIS — I639 Cerebral infarction, unspecified: Secondary | ICD-10-CM

## 2017-09-24 DIAGNOSIS — Z79899 Other long term (current) drug therapy: Secondary | ICD-10-CM | POA: Diagnosis not present

## 2017-09-24 DIAGNOSIS — I10 Essential (primary) hypertension: Secondary | ICD-10-CM | POA: Diagnosis not present

## 2017-09-24 DIAGNOSIS — Z8249 Family history of ischemic heart disease and other diseases of the circulatory system: Secondary | ICD-10-CM | POA: Diagnosis not present

## 2017-09-24 DIAGNOSIS — I69398 Other sequelae of cerebral infarction: Secondary | ICD-10-CM | POA: Insufficient documentation

## 2017-09-24 DIAGNOSIS — F17210 Nicotine dependence, cigarettes, uncomplicated: Secondary | ICD-10-CM | POA: Insufficient documentation

## 2017-09-24 DIAGNOSIS — R739 Hyperglycemia, unspecified: Secondary | ICD-10-CM | POA: Diagnosis not present

## 2017-09-24 DIAGNOSIS — M5030 Other cervical disc degeneration, unspecified cervical region: Secondary | ICD-10-CM | POA: Diagnosis not present

## 2017-09-24 DIAGNOSIS — E785 Hyperlipidemia, unspecified: Secondary | ICD-10-CM | POA: Insufficient documentation

## 2017-09-24 DIAGNOSIS — I4510 Unspecified right bundle-branch block: Secondary | ICD-10-CM | POA: Diagnosis not present

## 2017-09-24 DIAGNOSIS — I2510 Atherosclerotic heart disease of native coronary artery without angina pectoris: Secondary | ICD-10-CM | POA: Diagnosis not present

## 2017-09-24 DIAGNOSIS — R269 Unspecified abnormalities of gait and mobility: Secondary | ICD-10-CM | POA: Diagnosis not present

## 2017-09-24 DIAGNOSIS — Z833 Family history of diabetes mellitus: Secondary | ICD-10-CM | POA: Insufficient documentation

## 2017-09-24 DIAGNOSIS — G629 Polyneuropathy, unspecified: Secondary | ICD-10-CM | POA: Diagnosis not present

## 2017-09-24 DIAGNOSIS — G8929 Other chronic pain: Secondary | ICD-10-CM | POA: Diagnosis not present
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
## 2017-09-24 NOTE — Patient Instructions (Addendum)
OK to resume driving  OK to resume Methyphenidate  May stop gabapentin  No work yet will discuss at next visit

## 2017-09-28 ENCOUNTER — Encounter: Payer: Self-pay | Admitting: Physical Therapy

## 2017-09-28 ENCOUNTER — Encounter: Payer: BLUE CROSS/BLUE SHIELD | Admitting: Physical Therapy

## 2017-09-28 ENCOUNTER — Ambulatory Visit: Payer: BLUE CROSS/BLUE SHIELD | Admitting: Occupational Therapy

## 2017-09-28 ENCOUNTER — Ambulatory Visit: Payer: BLUE CROSS/BLUE SHIELD | Admitting: Physical Therapy

## 2017-09-28 DIAGNOSIS — R2681 Unsteadiness on feet: Secondary | ICD-10-CM | POA: Diagnosis not present

## 2017-09-28 DIAGNOSIS — R208 Other disturbances of skin sensation: Secondary | ICD-10-CM | POA: Diagnosis not present

## 2017-09-28 DIAGNOSIS — I69351 Hemiplegia and hemiparesis following cerebral infarction affecting right dominant side: Secondary | ICD-10-CM

## 2017-09-28 DIAGNOSIS — R278 Other lack of coordination: Secondary | ICD-10-CM

## 2017-09-28 DIAGNOSIS — M6281 Muscle weakness (generalized): Secondary | ICD-10-CM | POA: Diagnosis not present

## 2017-09-28 DIAGNOSIS — I69353 Hemiplegia and hemiparesis following cerebral infarction affecting right non-dominant side: Secondary | ICD-10-CM | POA: Diagnosis not present

## 2017-09-28 DIAGNOSIS — R2689 Other abnormalities of gait and mobility: Secondary | ICD-10-CM | POA: Diagnosis not present
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
## 2017-09-29 ENCOUNTER — Ambulatory Visit: Payer: BLUE CROSS/BLUE SHIELD | Admitting: Adult Health

## 2017-09-29 ENCOUNTER — Encounter: Payer: Self-pay | Admitting: Adult Health

## 2017-09-29 VITALS — BP 131/76 | HR 70 | Ht 72.0 in | Wt 224.4 lb

## 2017-09-29 DIAGNOSIS — I6350 Cerebral infarction due to unspecified occlusion or stenosis of unspecified cerebral artery: Secondary | ICD-10-CM

## 2017-09-29 DIAGNOSIS — R200 Anesthesia of skin: Secondary | ICD-10-CM

## 2017-09-29 DIAGNOSIS — R2689 Other abnormalities of gait and mobility: Secondary | ICD-10-CM | POA: Diagnosis not present

## 2017-09-29 DIAGNOSIS — I10 Essential (primary) hypertension: Secondary | ICD-10-CM | POA: Diagnosis not present

## 2017-09-29 DIAGNOSIS — E785 Hyperlipidemia, unspecified: Secondary | ICD-10-CM | POA: Diagnosis not present

## 2017-09-29 DIAGNOSIS — R202 Paresthesia of skin: Secondary | ICD-10-CM | POA: Diagnosis not present

## 2017-09-29 DIAGNOSIS — I639 Cerebral infarction, unspecified: Secondary | ICD-10-CM

## 2017-09-29 NOTE — Patient Instructions (Signed)
Continue aspirin 81 mg daily and clopidogrel 75 mg daily  and lipitor  for secondary stroke prevention  Stop plavix and increase aspirin to 325mg  after 11/18/17  Continue to follow up with PCP regarding cholesterol and blood pressure management   Continue to monitor blood pressure at home  Maintain strict control of hypertension with blood pressure goal below 130/90, diabetes with hemoglobin A1c goal below 6.5% and cholesterol with LDL cholesterol (bad cholesterol) goal below 70 mg/dL. I also advised the patient to eat a healthy diet with plenty of whole grains, cereals, fruits and vegetables, exercise regularly and maintain ideal body weight.  Followup in the future with me in 3 months or call earlier if needed       Thank you for coming to see [place] at [facility]. I hope we have been able to provide you high quality care today.  You may receive a patient satisfaction survey over the next few weeks. We would appreciate your feedback and comments so that we may continue to improve ourselves and the health of our patients.
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
## 2017-09-30 ENCOUNTER — Encounter: Payer: Self-pay | Admitting: Physical Therapy

## 2017-09-30 ENCOUNTER — Ambulatory Visit: Payer: BLUE CROSS/BLUE SHIELD | Admitting: Physical Therapy

## 2017-09-30 ENCOUNTER — Ambulatory Visit: Payer: BLUE CROSS/BLUE SHIELD | Admitting: Occupational Therapy

## 2017-09-30 VITALS — BP 127/76 | HR 71

## 2017-09-30 DIAGNOSIS — I69353 Hemiplegia and hemiparesis following cerebral infarction affecting right non-dominant side: Secondary | ICD-10-CM | POA: Diagnosis not present

## 2017-09-30 DIAGNOSIS — R208 Other disturbances of skin sensation: Secondary | ICD-10-CM | POA: Diagnosis not present

## 2017-09-30 DIAGNOSIS — R2681 Unsteadiness on feet: Secondary | ICD-10-CM | POA: Diagnosis not present

## 2017-09-30 DIAGNOSIS — R278 Other lack of coordination: Secondary | ICD-10-CM

## 2017-09-30 DIAGNOSIS — I69351 Hemiplegia and hemiparesis following cerebral infarction affecting right dominant side: Secondary | ICD-10-CM | POA: Diagnosis not present

## 2017-09-30 DIAGNOSIS — R2689 Other abnormalities of gait and mobility: Secondary | ICD-10-CM | POA: Diagnosis not present

## 2017-09-30 DIAGNOSIS — M6281 Muscle weakness (generalized): Secondary | ICD-10-CM | POA: Diagnosis not present
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
## 2017-09-30 NOTE — Progress Notes (Signed)
I agree with the above plan 
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
## 2017-10-05 ENCOUNTER — Encounter: Payer: Self-pay | Admitting: Physical Therapy

## 2017-10-05 ENCOUNTER — Ambulatory Visit: Payer: BLUE CROSS/BLUE SHIELD | Admitting: Physical Therapy

## 2017-10-05 ENCOUNTER — Telehealth: Payer: Self-pay | Admitting: Family Medicine

## 2017-10-05 ENCOUNTER — Ambulatory Visit: Payer: BLUE CROSS/BLUE SHIELD | Admitting: Occupational Therapy

## 2017-10-05 DIAGNOSIS — M542 Cervicalgia: Secondary | ICD-10-CM

## 2017-10-05 DIAGNOSIS — M6281 Muscle weakness (generalized): Secondary | ICD-10-CM | POA: Diagnosis not present

## 2017-10-05 DIAGNOSIS — R2681 Unsteadiness on feet: Secondary | ICD-10-CM

## 2017-10-05 DIAGNOSIS — I69353 Hemiplegia and hemiparesis following cerebral infarction affecting right non-dominant side: Secondary | ICD-10-CM | POA: Diagnosis not present

## 2017-10-05 DIAGNOSIS — R208 Other disturbances of skin sensation: Secondary | ICD-10-CM | POA: Diagnosis not present

## 2017-10-05 DIAGNOSIS — R278 Other lack of coordination: Secondary | ICD-10-CM

## 2017-10-05 DIAGNOSIS — R2689 Other abnormalities of gait and mobility: Secondary | ICD-10-CM | POA: Diagnosis not present

## 2017-10-05 DIAGNOSIS — I69351 Hemiplegia and hemiparesis following cerebral infarction affecting right dominant side: Secondary | ICD-10-CM | POA: Diagnosis not present

## 2017-10-05 MED ORDER — CLOPIDOGREL BISULFATE 75 MG PO TABS: 75.0000 mg | ORAL_TABLET | Freq: Every day | ORAL | 3 refills | Status: DC

## 2017-10-05 NOTE — Telephone Encounter (Signed)
Rx has been sent into pt's pharmacy. Called pt and left a VM to advise Rx has been sent.

## 2017-10-05 NOTE — Telephone Encounter (Signed)
Patient needs Rx clopidogrel (PLAVIX) 75 MG tablet  Sent to:   [facility], [place] [phone] (Phone) [phone] (Fax)     Patient has one left and needs called in ASAP
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
## 2017-10-05 NOTE — Patient Instructions (Signed)
Access Code: [email or web address]  URL: [email or web address]  Date: 10/05/2017  Prepared by: Barry Brunner   Exercises added to HEP today Standing Terminal Knee Extension with Resistance - 10 reps - 3 sets - 1x daily - 7x weekly  Standing Terminal Knee Extension with Resistance - 10 reps - 3 sets - 1x daily - 7x weekly  Bird Dog - 10 reps - 3 sets - 1x daily - 7x weekly

## 2017-10-05 NOTE — Telephone Encounter (Signed)
Call in #[phone] with 3 rf  

## 2017-10-05 NOTE — Addendum Note (Signed)
Addended by: Myriam Forehand on: 10/05/2017 03:56 PM   Modules accepted: Orders
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
## 2017-10-05 NOTE — Telephone Encounter (Signed)
Last OV 09/02/2017   Sent to PCP for approval

## 2017-10-08 ENCOUNTER — Encounter: Payer: Self-pay | Admitting: Physical Therapy

## 2017-10-08 ENCOUNTER — Ambulatory Visit: Payer: BLUE CROSS/BLUE SHIELD | Admitting: Physical Therapy

## 2017-10-08 ENCOUNTER — Ambulatory Visit: Payer: BLUE CROSS/BLUE SHIELD | Admitting: Occupational Therapy

## 2017-10-08 DIAGNOSIS — R2681 Unsteadiness on feet: Secondary | ICD-10-CM | POA: Diagnosis not present

## 2017-10-08 DIAGNOSIS — M6281 Muscle weakness (generalized): Secondary | ICD-10-CM

## 2017-10-08 DIAGNOSIS — I69353 Hemiplegia and hemiparesis following cerebral infarction affecting right non-dominant side: Secondary | ICD-10-CM | POA: Diagnosis not present

## 2017-10-08 DIAGNOSIS — R208 Other disturbances of skin sensation: Secondary | ICD-10-CM | POA: Diagnosis not present

## 2017-10-08 DIAGNOSIS — R2689 Other abnormalities of gait and mobility: Secondary | ICD-10-CM | POA: Diagnosis not present

## 2017-10-08 DIAGNOSIS — R278 Other lack of coordination: Secondary | ICD-10-CM | POA: Diagnosis not present

## 2017-10-08 DIAGNOSIS — I69351 Hemiplegia and hemiparesis following cerebral infarction affecting right dominant side: Secondary | ICD-10-CM | POA: Diagnosis not present

## 2017-10-08 NOTE — Patient Instructions (Signed)
Access Code: [email or web address]  URL: [email or web address]  Date: 10/08/2017  Prepared by: Barry Brunner   Exercises  Tandem Walking with Counter Support - 10 reps - 1 sets - 1x daily - 5x weekly  Standing Hamstring Curl with Resistance - 10 reps - 3 sets - 3 sec hold - 1x daily - 5x weekly  Standing Single Leg Stance with Unilateral Counter Support - 3 reps - 1 sets - 10-30 seconds hold - 3x daily - 7x weekly  Standing Romberg to 1/4 Tandem Stance - 10 reps - 1 sets - 1x daily - 7x weekly  Forward Walking with Diagonal Head Turns - 1 sets - 1x daily - 7x weekly  Standing Terminal Knee Extension with Resistance - 10 reps - 3 sets - 1x daily - 7x weekly  Standing Terminal Knee Extension with Resistance - 10 reps - 3 sets - 1x daily - 7x weekly  Bird Dog - 10 reps - 3 sets - 1x daily - 7x weekly   ADDED TODAY Seated Gastroc Stretch with Strap - 3 reps - 1 sets - 30+ hold - 3x daily - 7x weekly  Seated Eccentric Ankle Plantar Flexion with Resistance - Straight Leg - 10 reps - 3 sets - 5 hold - 2x daily - 7x weekly
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
## 2017-10-12 ENCOUNTER — Encounter: Payer: BLUE CROSS/BLUE SHIELD | Admitting: Occupational Therapy

## 2017-10-12 ENCOUNTER — Ambulatory Visit: Payer: BLUE CROSS/BLUE SHIELD | Attending: Physical Medicine & Rehabilitation | Admitting: Physical Therapy

## 2017-10-12 ENCOUNTER — Encounter: Payer: Self-pay | Admitting: Physical Therapy

## 2017-10-12 ENCOUNTER — Ambulatory Visit: Payer: BLUE CROSS/BLUE SHIELD | Attending: Family Medicine | Admitting: Physical Therapy

## 2017-10-12 DIAGNOSIS — M6281 Muscle weakness (generalized): Secondary | ICD-10-CM | POA: Insufficient documentation

## 2017-10-12 DIAGNOSIS — M542 Cervicalgia: Secondary | ICD-10-CM

## 2017-10-12 DIAGNOSIS — R2689 Other abnormalities of gait and mobility: Secondary | ICD-10-CM | POA: Diagnosis not present

## 2017-10-12 DIAGNOSIS — R2681 Unsteadiness on feet: Secondary | ICD-10-CM | POA: Insufficient documentation
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
## 2017-10-14 ENCOUNTER — Ambulatory Visit: Payer: BLUE CROSS/BLUE SHIELD | Admitting: Physical Therapy

## 2017-10-14 ENCOUNTER — Encounter: Payer: BLUE CROSS/BLUE SHIELD | Admitting: Occupational Therapy

## 2017-10-19 ENCOUNTER — Encounter: Payer: BLUE CROSS/BLUE SHIELD | Admitting: Occupational Therapy

## 2017-10-19 ENCOUNTER — Ambulatory Visit: Payer: BLUE CROSS/BLUE SHIELD | Admitting: Physical Therapy

## 2017-10-19 ENCOUNTER — Encounter: Payer: Self-pay | Admitting: Physical Therapy

## 2017-10-19 DIAGNOSIS — M6281 Muscle weakness (generalized): Secondary | ICD-10-CM | POA: Diagnosis not present

## 2017-10-19 DIAGNOSIS — R2681 Unsteadiness on feet: Secondary | ICD-10-CM

## 2017-10-19 DIAGNOSIS — R2689 Other abnormalities of gait and mobility: Secondary | ICD-10-CM
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
## 2017-10-21 ENCOUNTER — Ambulatory Visit: Payer: BLUE CROSS/BLUE SHIELD | Admitting: Physical Therapy

## 2017-10-21 ENCOUNTER — Encounter: Payer: Self-pay | Admitting: Physical Therapy

## 2017-10-21 ENCOUNTER — Encounter: Payer: BLUE CROSS/BLUE SHIELD | Admitting: Occupational Therapy

## 2017-10-21 DIAGNOSIS — M542 Cervicalgia: Secondary | ICD-10-CM | POA: Diagnosis not present

## 2017-10-21 NOTE — Patient Instructions (Signed)
Access Code: [email or web address]  URL: [email or web address]  Date: 10/21/2017  Prepared by: Earlie Counts   Exercises  Seated Cervical Retraction Extension Passive Loaded - 5 reps - 1 sets - 5 hold - 2x daily - 7x weekly  Seated Cervical Flexion AROM - 5 reps - 1 sets - 10 sec hold - 1x daily - 7x weekly  Standing Cervical Rotation AROM with Overpressure - 5 reps - 1 sets - 15 hold - 1x daily - 7x weekly  Seated Cervical Sidebending Stretch - 5 reps - 1 sets - 15 hold - 1x daily - 7x weekly  Mid-Lower Cervical Extension SNAG with Strap - 5 reps - 1 sets - 1x daily - 7x weekly  Upper Cervical Rotation SNAG with Strap - 5 reps - 1 sets - 2 hold - 1x daily - 7x weekly  [facility] [address] [place], [place] [address] Phone # [phone] Fax [phone]
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
## 2017-10-26 ENCOUNTER — Encounter: Payer: Self-pay | Admitting: Physical Medicine & Rehabilitation

## 2017-10-26 ENCOUNTER — Ambulatory Visit: Payer: BLUE CROSS/BLUE SHIELD | Admitting: Physical Medicine & Rehabilitation

## 2017-10-26 ENCOUNTER — Encounter: Payer: BLUE CROSS/BLUE SHIELD | Attending: Physical Medicine & Rehabilitation

## 2017-10-26 VITALS — BP 119/76 | HR 79 | Ht 72.0 in | Wt 225.0 lb

## 2017-10-26 DIAGNOSIS — G4730 Sleep apnea, unspecified: Secondary | ICD-10-CM | POA: Insufficient documentation

## 2017-10-26 DIAGNOSIS — R269 Unspecified abnormalities of gait and mobility: Secondary | ICD-10-CM

## 2017-10-26 DIAGNOSIS — G8929 Other chronic pain: Secondary | ICD-10-CM | POA: Insufficient documentation

## 2017-10-26 DIAGNOSIS — I2510 Atherosclerotic heart disease of native coronary artery without angina pectoris: Secondary | ICD-10-CM | POA: Insufficient documentation

## 2017-10-26 DIAGNOSIS — M5030 Other cervical disc degeneration, unspecified cervical region: Secondary | ICD-10-CM | POA: Insufficient documentation

## 2017-10-26 DIAGNOSIS — I10 Essential (primary) hypertension: Secondary | ICD-10-CM | POA: Insufficient documentation

## 2017-10-26 DIAGNOSIS — Z79899 Other long term (current) drug therapy: Secondary | ICD-10-CM | POA: Insufficient documentation

## 2017-10-26 DIAGNOSIS — E785 Hyperlipidemia, unspecified: Secondary | ICD-10-CM | POA: Insufficient documentation

## 2017-10-26 DIAGNOSIS — R739 Hyperglycemia, unspecified: Secondary | ICD-10-CM | POA: Insufficient documentation

## 2017-10-26 DIAGNOSIS — I69398 Other sequelae of cerebral infarction: Secondary | ICD-10-CM | POA: Diagnosis not present

## 2017-10-26 DIAGNOSIS — Z8249 Family history of ischemic heart disease and other diseases of the circulatory system: Secondary | ICD-10-CM | POA: Insufficient documentation

## 2017-10-26 DIAGNOSIS — F17210 Nicotine dependence, cigarettes, uncomplicated: Secondary | ICD-10-CM | POA: Diagnosis not present

## 2017-10-26 DIAGNOSIS — G629 Polyneuropathy, unspecified: Secondary | ICD-10-CM | POA: Insufficient documentation

## 2017-10-26 DIAGNOSIS — Z833 Family history of diabetes mellitus: Secondary | ICD-10-CM | POA: Diagnosis not present

## 2017-10-26 DIAGNOSIS — I639 Cerebral infarction, unspecified: Secondary | ICD-10-CM | POA: Diagnosis not present

## 2017-10-26 DIAGNOSIS — I4510 Unspecified right bundle-branch block: Secondary | ICD-10-CM | POA: Insufficient documentation
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
## 2017-10-27 ENCOUNTER — Other Ambulatory Visit: Payer: Self-pay | Admitting: Family Medicine

## 2017-10-27 NOTE — Telephone Encounter (Signed)
Copied from [place] #[id number]. Topic: Quick Communication - Rx Refill/Question >> Oct 27, 2017 12:27 PM [name] wrote: Medication: methylphenidate (RITALIN LA) 20 MG 24 hr capsule [[id number]]   Has the patient contacted their pharmacy? Yes.   (Agent: If no, request that the patient contact the pharmacy for the refill.) (Agent: If yes, when and what did the pharmacy advise?)  Preferred Pharmacy (with phone number or street name): [facility]  Agent: Please be advised that RX refills may take up to 3 business days. We ask that you follow-up with your pharmacy.

## 2017-10-27 NOTE — Telephone Encounter (Signed)
Ritalin refill Last OV:09/02/17 Last refill:09/29/17 by historical provider PCP:Fry Pharmacy: [facility] [address], [place] [phone] (Phone) [phone] (Fax)

## 2017-10-28 ENCOUNTER — Telehealth: Payer: Self-pay | Admitting: *Deleted

## 2017-10-28 NOTE — Telephone Encounter (Signed)
Patient notified to contact STD carrier to send a form to be filled out by Dr. Letta Pate.

## 2017-10-28 NOTE — Telephone Encounter (Signed)
Patient walked into clinic asking for a letter from Dr. Dianna Limbo indicating patients inability to return to work. He is also asking for [place] to provide documentation to his short term disability provider.  I instructed patient that I would send a request to Dr. Letta Pate for an unable to return to work letter and I advised the patient to fill out a form for release of medical information to his short term disability insurance carrier.  I advised patient to contact short term disability to find out what specific documents are needed and/or to see if they can send [place] a fax outlining those needs

## 2017-10-28 NOTE — Telephone Encounter (Signed)
Last OV 09/02/2017   Last refilled 06/30/2017 disp 30 with no refills   Sent to PCP to advise

## 2017-10-28 NOTE — Telephone Encounter (Signed)
Pt not ready for return to work.  We need a form from his STD carrier to complete rather than writing a letter

## 2017-10-29 MED ORDER — METHYLPHENIDATE HCL ER (LA) 20 MG PO CP24: 20.0000 mg | ORAL_CAPSULE | ORAL | 0 refills | Status: DC

## 2017-10-29 NOTE — Telephone Encounter (Signed)
Done

## 2017-11-25 DIAGNOSIS — H04123 Dry eye syndrome of bilateral lacrimal glands: Secondary | ICD-10-CM | POA: Diagnosis not present

## 2017-12-07 ENCOUNTER — Ambulatory Visit: Payer: BLUE CROSS/BLUE SHIELD | Admitting: Physical Medicine & Rehabilitation

## 2017-12-07 ENCOUNTER — Encounter: Payer: BLUE CROSS/BLUE SHIELD | Attending: Physical Medicine & Rehabilitation

## 2017-12-07 ENCOUNTER — Other Ambulatory Visit: Payer: Self-pay

## 2017-12-07 ENCOUNTER — Encounter: Payer: Self-pay | Admitting: Physical Medicine & Rehabilitation

## 2017-12-07 VITALS — BP 148/93 | HR 81 | Ht 72.0 in | Wt 231.0 lb

## 2017-12-07 DIAGNOSIS — E785 Hyperlipidemia, unspecified: Secondary | ICD-10-CM | POA: Insufficient documentation

## 2017-12-07 DIAGNOSIS — I69398 Other sequelae of cerebral infarction: Secondary | ICD-10-CM | POA: Diagnosis not present

## 2017-12-07 DIAGNOSIS — R269 Unspecified abnormalities of gait and mobility: Secondary | ICD-10-CM | POA: Insufficient documentation

## 2017-12-07 DIAGNOSIS — G629 Polyneuropathy, unspecified: Secondary | ICD-10-CM | POA: Diagnosis not present

## 2017-12-07 DIAGNOSIS — I2510 Atherosclerotic heart disease of native coronary artery without angina pectoris: Secondary | ICD-10-CM | POA: Diagnosis not present

## 2017-12-07 DIAGNOSIS — Z833 Family history of diabetes mellitus: Secondary | ICD-10-CM | POA: Insufficient documentation

## 2017-12-07 DIAGNOSIS — G8929 Other chronic pain: Secondary | ICD-10-CM | POA: Insufficient documentation

## 2017-12-07 DIAGNOSIS — R739 Hyperglycemia, unspecified: Secondary | ICD-10-CM | POA: Insufficient documentation

## 2017-12-07 DIAGNOSIS — M5030 Other cervical disc degeneration, unspecified cervical region: Secondary | ICD-10-CM | POA: Insufficient documentation

## 2017-12-07 DIAGNOSIS — I639 Cerebral infarction, unspecified: Secondary | ICD-10-CM | POA: Insufficient documentation

## 2017-12-07 DIAGNOSIS — Z79899 Other long term (current) drug therapy: Secondary | ICD-10-CM | POA: Insufficient documentation

## 2017-12-07 DIAGNOSIS — I4510 Unspecified right bundle-branch block: Secondary | ICD-10-CM | POA: Diagnosis not present

## 2017-12-07 DIAGNOSIS — I10 Essential (primary) hypertension: Secondary | ICD-10-CM | POA: Insufficient documentation

## 2017-12-07 DIAGNOSIS — Z8249 Family history of ischemic heart disease and other diseases of the circulatory system: Secondary | ICD-10-CM | POA: Diagnosis not present

## 2017-12-07 DIAGNOSIS — F17210 Nicotine dependence, cigarettes, uncomplicated: Secondary | ICD-10-CM | POA: Diagnosis not present

## 2017-12-07 DIAGNOSIS — G4730 Sleep apnea, unspecified: Secondary | ICD-10-CM | POA: Insufficient documentation

## 2017-12-07 NOTE — Patient Instructions (Addendum)
Please call if you need a letter to return to work in 1 month  Plan return to work date Sept 26
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
## 2017-12-10 ENCOUNTER — Telehealth: Payer: Self-pay | Admitting: Physical Medicine & Rehabilitation

## 2017-12-10 NOTE — Telephone Encounter (Signed)
Riley Hartman with [employer] disability compay called to confirm [place] supports Riley Hartman being out on disability -- note indicates may return to work in one month - dated 08.26.19. Called disability company back to advise

## 2017-12-11 ENCOUNTER — Ambulatory Visit (INDEPENDENT_AMBULATORY_CARE_PROVIDER_SITE_OTHER): Payer: BLUE CROSS/BLUE SHIELD | Admitting: Family Medicine

## 2017-12-11 ENCOUNTER — Encounter: Payer: Self-pay | Admitting: Family Medicine

## 2017-12-11 VITALS — BP 124/78 | HR 112 | Temp 97.5°F | Ht 72.0 in | Wt 229.2 lb

## 2017-12-11 DIAGNOSIS — L0292 Furuncle, unspecified: Secondary | ICD-10-CM | POA: Diagnosis not present

## 2017-12-11 MED ORDER — DOXYCYCLINE HYCLATE 100 MG PO CAPS: 100.0000 mg | ORAL_CAPSULE | Freq: Two times a day (BID) | ORAL | 0 refills | Status: AC

## 2017-12-14 LAB — WOUND CULTURE
MICRO NUMBER:: 91041644
RESULT:: NO GROWTH
SPECIMEN QUALITY:: ADEQUATE

## 2017-12-15 ENCOUNTER — Encounter: Payer: Self-pay | Admitting: Family Medicine

## 2017-12-15 NOTE — Progress Notes (Signed)
   Subjective:    Patient ID: Riley Hartman, male    DOB: Sep 13, 1960, 57 y.o.   MRN: [id number]  HPI Here for a painful cyst on the right buttock. This has been present for years but 4 days ago it swelled and became painful. It has drained fluid a few times. No fever.    Review of Systems  Constitutional: Negative.   Respiratory: Negative.   Skin: Positive for wound.       Objective:   Physical Exam  Constitutional: He appears well-developed and well-nourished.  Cardiovascular: Normal rate, regular rhythm, normal heart sounds and intact distal pulses.  Pulmonary/Chest: Effort normal and breath sounds normal.  Skin:  Right buttock has a tender boil which has purulent fluid draining. This emptied with pressure and a culture was obtained.           Assessment & Plan:  Boil, treat with hot soaks and Doxycycline. Alysia Penna, MD

## 2017-12-28 ENCOUNTER — Other Ambulatory Visit: Payer: Self-pay | Admitting: Physical Medicine and Rehabilitation

## 2017-12-30 ENCOUNTER — Ambulatory Visit (INDEPENDENT_AMBULATORY_CARE_PROVIDER_SITE_OTHER): Payer: BLUE CROSS/BLUE SHIELD | Admitting: Adult Health

## 2017-12-30 ENCOUNTER — Encounter: Payer: Self-pay | Admitting: Adult Health

## 2017-12-30 VITALS — BP 136/86 | HR 85 | Wt 231.5 lb

## 2017-12-30 DIAGNOSIS — R2689 Other abnormalities of gait and mobility: Secondary | ICD-10-CM | POA: Diagnosis not present

## 2017-12-30 DIAGNOSIS — E785 Hyperlipidemia, unspecified: Secondary | ICD-10-CM

## 2017-12-30 DIAGNOSIS — I639 Cerebral infarction, unspecified: Secondary | ICD-10-CM

## 2017-12-30 DIAGNOSIS — I10 Essential (primary) hypertension: Secondary | ICD-10-CM | POA: Diagnosis not present

## 2017-12-30 DIAGNOSIS — I6350 Cerebral infarction due to unspecified occlusion or stenosis of unspecified cerebral artery: Secondary | ICD-10-CM | POA: Diagnosis not present

## 2017-12-30 MED ORDER — ATORVASTATIN CALCIUM 80 MG PO TABS: 80.0000 mg | ORAL_TABLET | Freq: Every day | ORAL | 4 refills | Status: DC

## 2017-12-30 MED ORDER — ASPIRIN EC 325 MG PO TBEC: 325.0000 mg | DELAYED_RELEASE_TABLET | Freq: Every day | ORAL | 0 refills | Status: DC
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
## 2017-12-30 NOTE — Patient Instructions (Signed)
Start aspirin 325 mg daily  and lipitor 80mg   for secondary stroke prevention  Stop aspirin 81mg  and stop plavix at this time and continue full dose of aspirin 325mg  only  Continue to follow up with PCP regarding cholesterol and blood pressure management   Return to work on 01/07/18 - work note will be provided by Dr. Letta Pate   Continue to monitor blood pressure at home  Maintain strict control of hypertension with blood pressure goal below 130/90, diabetes with hemoglobin A1c goal below 6.5% and cholesterol with LDL cholesterol (bad cholesterol) goal below 70 mg/dL. I also advised the patient to eat a healthy diet with plenty of whole grains, cereals, fruits and vegetables, exercise regularly and maintain ideal body weight.  Followup in the future with me in 6 months or call earlier if needed       Thank you for coming to see [place] at [facility]. I hope we have been able to provide you high quality care today.  You may receive a patient satisfaction survey over the next few weeks. We would appreciate your feedback and comments so that we may continue to improve ourselves and the health of our patients.

## 2017-12-31 ENCOUNTER — Encounter: Payer: Self-pay | Admitting: Physical Medicine & Rehabilitation

## 2017-12-31 NOTE — Progress Notes (Signed)
I agree with the above plan 

## 2018-01-18 ENCOUNTER — Encounter: Payer: BLUE CROSS/BLUE SHIELD | Attending: Physical Medicine & Rehabilitation

## 2018-01-18 ENCOUNTER — Encounter: Payer: Self-pay | Admitting: Physical Medicine & Rehabilitation

## 2018-01-18 ENCOUNTER — Ambulatory Visit (HOSPITAL_BASED_OUTPATIENT_CLINIC_OR_DEPARTMENT_OTHER): Payer: BLUE CROSS/BLUE SHIELD | Admitting: Physical Medicine & Rehabilitation

## 2018-01-18 VITALS — BP 146/84 | HR 77 | Ht 72.0 in | Wt 233.4 lb

## 2018-01-18 DIAGNOSIS — Z8249 Family history of ischemic heart disease and other diseases of the circulatory system: Secondary | ICD-10-CM | POA: Insufficient documentation

## 2018-01-18 DIAGNOSIS — M7741 Metatarsalgia, right foot: Secondary | ICD-10-CM

## 2018-01-18 DIAGNOSIS — G8929 Other chronic pain: Secondary | ICD-10-CM | POA: Insufficient documentation

## 2018-01-18 DIAGNOSIS — I2510 Atherosclerotic heart disease of native coronary artery without angina pectoris: Secondary | ICD-10-CM | POA: Insufficient documentation

## 2018-01-18 DIAGNOSIS — M7742 Metatarsalgia, left foot: Secondary | ICD-10-CM | POA: Diagnosis not present

## 2018-01-18 DIAGNOSIS — G4730 Sleep apnea, unspecified: Secondary | ICD-10-CM | POA: Insufficient documentation

## 2018-01-18 DIAGNOSIS — R739 Hyperglycemia, unspecified: Secondary | ICD-10-CM | POA: Diagnosis not present

## 2018-01-18 DIAGNOSIS — I639 Cerebral infarction, unspecified: Secondary | ICD-10-CM | POA: Diagnosis not present

## 2018-01-18 DIAGNOSIS — Z79899 Other long term (current) drug therapy: Secondary | ICD-10-CM | POA: Diagnosis not present

## 2018-01-18 DIAGNOSIS — I10 Essential (primary) hypertension: Secondary | ICD-10-CM | POA: Insufficient documentation

## 2018-01-18 DIAGNOSIS — E785 Hyperlipidemia, unspecified: Secondary | ICD-10-CM | POA: Diagnosis not present

## 2018-01-18 DIAGNOSIS — I69398 Other sequelae of cerebral infarction: Secondary | ICD-10-CM

## 2018-01-18 DIAGNOSIS — Z833 Family history of diabetes mellitus: Secondary | ICD-10-CM | POA: Insufficient documentation

## 2018-01-18 DIAGNOSIS — R269 Unspecified abnormalities of gait and mobility: Secondary | ICD-10-CM | POA: Diagnosis not present

## 2018-01-18 DIAGNOSIS — I4510 Unspecified right bundle-branch block: Secondary | ICD-10-CM | POA: Insufficient documentation

## 2018-01-18 DIAGNOSIS — G629 Polyneuropathy, unspecified: Secondary | ICD-10-CM | POA: Insufficient documentation

## 2018-01-18 DIAGNOSIS — F17210 Nicotine dependence, cigarettes, uncomplicated: Secondary | ICD-10-CM | POA: Diagnosis not present

## 2018-01-18 DIAGNOSIS — M5030 Other cervical disc degeneration, unspecified cervical region: Secondary | ICD-10-CM | POA: Insufficient documentation
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
## 2018-01-18 NOTE — Patient Instructions (Signed)
Please call if any work issues related to stroke

## 2018-01-27 ENCOUNTER — Ambulatory Visit (INDEPENDENT_AMBULATORY_CARE_PROVIDER_SITE_OTHER): Payer: BLUE CROSS/BLUE SHIELD | Admitting: Podiatry

## 2018-01-27 ENCOUNTER — Encounter: Payer: Self-pay | Admitting: Podiatry

## 2018-01-27 ENCOUNTER — Ambulatory Visit (INDEPENDENT_AMBULATORY_CARE_PROVIDER_SITE_OTHER): Payer: BLUE CROSS/BLUE SHIELD

## 2018-01-27 ENCOUNTER — Other Ambulatory Visit: Payer: Self-pay | Admitting: Podiatry

## 2018-01-27 DIAGNOSIS — M2022 Hallux rigidus, left foot: Secondary | ICD-10-CM | POA: Diagnosis not present

## 2018-01-27 DIAGNOSIS — M779 Enthesopathy, unspecified: Secondary | ICD-10-CM

## 2018-01-27 DIAGNOSIS — M79672 Pain in left foot: Secondary | ICD-10-CM | POA: Diagnosis not present

## 2018-01-27 DIAGNOSIS — M205X2 Other deformities of toe(s) (acquired), left foot: Secondary | ICD-10-CM

## 2018-01-27 DIAGNOSIS — M2021 Hallux rigidus, right foot: Secondary | ICD-10-CM | POA: Diagnosis not present

## 2018-01-27 DIAGNOSIS — M205X9 Other deformities of toe(s) (acquired), unspecified foot: Secondary | ICD-10-CM

## 2018-01-27 DIAGNOSIS — M79671 Pain in right foot: Secondary | ICD-10-CM | POA: Diagnosis not present

## 2018-01-27 DIAGNOSIS — M205X1 Other deformities of toe(s) (acquired), right foot: Secondary | ICD-10-CM

## 2018-01-27 MED ORDER — TRIAMCINOLONE ACETONIDE 10 MG/ML IJ SUSP
10.0000 mg | Freq: Once | INTRAMUSCULAR | Status: AC
  Administered 2018-01-27: 10 mg

## 2018-01-27 NOTE — Progress Notes (Signed)
Subjective:   Patient ID: Riley Hartman, male   DOB: 57 y.o.   MRN: [id number]   HPI Patient presents stating that he had a stroke in May and that he has had ongoing problems with his foot left over right and has a lot of balance issues secondary to the stroke that he sustained.  Patient does not smoke anymore and likes to be active and this is very limiting and the pain is quite intense left   Review of Systems  All other systems reviewed and are negative.       Objective:  Physical Exam  Constitutional: He appears well-developed and well-nourished.  Cardiovascular: Intact distal pulses.  Pulmonary/Chest: Effort normal.  Musculoskeletal: Normal range of motion.  Neurological: He is alert.  Skin: Skin is warm.  Nursing note and vitals reviewed.   Neurovascular status was found to be intact with what appears to be moderate muscle strength loss bilateral secondary to stroke.  Patient's found to have hallux limitus deformity of a moderate nature bilateral and has acute inflammation of the second metatarsal phalangeal joint left with pain in the joint and inability to walk comfortably with a limping type gait pattern.  Patient has good digital perfusion and is well oriented but does have slow speech due to the stroke  X-rays indicate spurring around the first metatarsal head left over right with narrowing of the joint surface and no indications of stress fracture or arthritis of the second MPJ     Assessment:  Inflammatory capsulitis second MPJ left with chronic balance issues and hallux limitus deformity of long-term nature     Plan:  Today we will get a focus on the acute condition and I did H&P and x-rays and discussed long-term balance bracing.  I am focusing on the left foot and I did a proximal nerve block of the area sterile prep was done of the second MPJ and I aspirated the joint getting out a small amount of clear fluid and injected half cc of dexamethasone Kenalog and applied  padding to reduce pressure on the joint surface.  Reappoint to recheck and we will again consider whether balance bracing or orthotics would be of great benefit to him

## 2018-02-01 ENCOUNTER — Telehealth: Payer: Self-pay | Admitting: Family Medicine

## 2018-02-01 NOTE — Telephone Encounter (Signed)
Copied from [place] [phone]. Topic: Quick Communication - See Telephone Encounter >> Feb 01, 2018  3:12 PM Hewitt Shorts wrote: Pt is needing a refill on methylphenidate   [facility] [address]  Best number [phone]

## 2018-02-02 MED ORDER — METHYLPHENIDATE HCL ER (LA) 20 MG PO CP24: 20.0000 mg | ORAL_CAPSULE | ORAL | 0 refills | Status: DC

## 2018-02-02 NOTE — Telephone Encounter (Signed)
Done

## 2018-02-02 NOTE — Telephone Encounter (Signed)
Requested medication (s) are due for refill today:   Yes  Requested medication (s) are on the active medication list:   Yes  Future visit scheduled:   No PCP:  [name]   Last ordered: 12/30/17  #30  0 refills    End:  01/29/18   Requested Prescriptions  Pending Prescriptions Disp Refills   methylphenidate (RITALIN LA) 20 MG 24 hr capsule 30 capsule 0    Sig: Take 1 capsule (20 mg total) by mouth every morning.     Not Delegated - Psychiatry:  Stimulants/ADHD Failed - 02/01/2018  4:03 PM      Failed - This refill cannot be delegated      Passed - Urine Drug Screen completed in last 360 days.      Passed - Valid encounter within last 3 months    Recent Outpatient Visits          1 month ago [facility] [place], [place]   5 months ago Cerebral thrombosis with cerebral infarction   [facility], Ishmael Holter, MD   5 months ago Neck pain   [facility] [place], MD   7 months ago Preventative health care   [facility] at [place], MD   8 months ago Left-sided chest wall pain   [facility], Ishmael Holter, MD

## 2018-02-10 NOTE — Telephone Encounter (Signed)
Pt states the pharmacy advised him this Rx needed a prior auth. Pt uses [facility] pharmacy had contacted the dr. Have you seen this.  [place], [place] [phone] (Phone) [phone] (Fax)   Pt been waiting since 10/22

## 2018-02-11 ENCOUNTER — Ambulatory Visit (INDEPENDENT_AMBULATORY_CARE_PROVIDER_SITE_OTHER): Payer: BLUE CROSS/BLUE SHIELD | Admitting: Podiatry

## 2018-02-11 ENCOUNTER — Encounter: Payer: Self-pay | Admitting: Podiatry

## 2018-02-11 DIAGNOSIS — R2681 Unsteadiness on feet: Secondary | ICD-10-CM | POA: Diagnosis not present

## 2018-02-11 DIAGNOSIS — M779 Enthesopathy, unspecified: Secondary | ICD-10-CM

## 2018-02-11 NOTE — Telephone Encounter (Signed)
Prior auth for methylphenidate ER 20mg  sent to [email or web address]-key [id number].

## 2018-02-11 NOTE — Progress Notes (Signed)
Subjective:   Patient ID: Riley Hartman, male   DOB: 57 y.o.   MRN: [id number]   HPI Patient presents stating my foot feels so much better with patient still having balance issues after stroke in May and needs to be active in its somewhat difficult for him to be active but he does try his best he can he does walk on a daily basis   ROS      Objective:  Physical Exam  Neurovascular status intact muscle strength is adequate with patient's left plantar foot doing much better with diminishment of discomfort and inflammation.  Patient's found to have some balance issues secondary to the stroke that he experiences met     Assessment:  Doing well post treatment capsulitis left with significant improvement with patient found to have moderate balance issues secondary to stroke may     Plan:  H&P discussed conditions and I do think the best treatment would be balance bracing to try to encourage him to be active and he may not have to wear them forever but they will be of benefit.  Patient will meet with Liliane Channel to discuss and to be casted for balance bracing

## 2018-02-15 ENCOUNTER — Other Ambulatory Visit: Payer: BLUE CROSS/BLUE SHIELD | Admitting: Orthotics

## 2018-02-15 ENCOUNTER — Telehealth: Payer: Self-pay | Admitting: *Deleted

## 2018-02-15 DIAGNOSIS — R4189 Other symptoms and signs involving cognitive functions and awareness: Secondary | ICD-10-CM

## 2018-02-15 NOTE — Telephone Encounter (Signed)
See prior note

## 2018-02-15 NOTE — Telephone Encounter (Signed)
Fax received from [facility] stating the request was denied and this was given to Dr Fry's asst. 

## 2018-02-15 NOTE — Telephone Encounter (Signed)
Copied from [place] [id number]. Topic: General - Other >> Feb 15, 2018 10:54 AM Yvette Rack wrote: Reason for CRM: Pt states he recently changed insurance and he was told that the prior authorization for methylphenidate (RITALIN LA) 20 MG 24 hr capsule needs to be sent to [facility]. Pt states he has been without the medication for about 2 weeks now and he can feel the effects. Pt requests a call back once the PA has been sent.

## 2018-02-16 NOTE — Telephone Encounter (Signed)
Pt calling to check on this.  States he has been without this medication for 17 days and he takes it to combat the effects he had of a stroke.  Pt wants to know what he needs to do and if he will be able to get his medication. Pt can be reached at [phone].

## 2018-02-16 NOTE — Telephone Encounter (Signed)
Dr. [name] please advise on what other medication can the pt take since his insurance has denied the PA that was done by out office.  Thanks

## 2018-02-17 ENCOUNTER — Other Ambulatory Visit: Payer: BLUE CROSS/BLUE SHIELD

## 2018-02-17 NOTE — Telephone Encounter (Signed)
Have the patient contact his insurance company to see what alternatives they will cover (maybe immediate release Adderal instead of ER?)

## 2018-02-17 NOTE — Telephone Encounter (Signed)
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 Can this PA be resent?

## 2018-02-17 NOTE — Telephone Encounter (Signed)
Is there a different diagnosis we can use for a new prior Auth?  Patient states he has a hard time focusing at work since his stroke which is affecting his job performance.   Please call patent and okay to leave a message.

## 2018-02-18 ENCOUNTER — Ambulatory Visit (INDEPENDENT_AMBULATORY_CARE_PROVIDER_SITE_OTHER): Payer: BLUE CROSS/BLUE SHIELD | Admitting: Family Medicine

## 2018-02-18 ENCOUNTER — Encounter: Payer: Self-pay | Admitting: Family Medicine

## 2018-02-18 VITALS — BP 124/70 | HR 80 | Temp 97.9°F | Wt 233.4 lb

## 2018-02-18 DIAGNOSIS — I69322 Dysarthria following cerebral infarction: Secondary | ICD-10-CM | POA: Diagnosis not present

## 2018-02-18 DIAGNOSIS — R4189 Other symptoms and signs involving cognitive functions and awareness: Secondary | ICD-10-CM

## 2018-02-18 DIAGNOSIS — I10 Essential (primary) hypertension: Secondary | ICD-10-CM | POA: Diagnosis not present

## 2018-02-18 DIAGNOSIS — F909 Attention-deficit hyperactivity disorder, unspecified type: Secondary | ICD-10-CM | POA: Diagnosis not present

## 2018-02-18 MED ORDER — CLONAZEPAM 1 MG PO TABS: ORAL_TABLET | ORAL | 5 refills | Status: DC

## 2018-02-18 NOTE — Telephone Encounter (Signed)
Please resubmit the PA using impaired cognition as the diagnosis (I have added this to his problem list)

## 2018-02-18 NOTE — Progress Notes (Signed)
   Subjective:    Patient ID: Riley Hartman, male    DOB: 24-Jan-1961, 57 y.o.   MRN: [id number]  HPI Here to discuss his Ritalin. He had been taking this for several years for ADHD, but he also found it to be very helpful for his difficulty in thinking clearly since his stroke. He describes losing his train of thought and forgetting the words he was intending to say during a conversation. He ran out of Ritalin several weeks ago, and we have been struggling to get this approved again by his new insurance company. During that time his disordered thinking has made it difficult to do his job and even to carry on conversations at home.    Review of Systems  Constitutional: Negative.   Respiratory: Negative.   Cardiovascular: Negative.   Neurological: Positive for speech difficulty. Negative for dizziness, tremors, facial asymmetry, weakness, numbness and headaches.  Psychiatric/Behavioral: Positive for decreased concentration. Negative for agitation, confusion, dysphoric mood and hallucinations. The patient is not nervous/anxious.        Objective:   Physical Exam  Constitutional: He is oriented to person, place, and time. He appears well-developed and well-nourished.  Cardiovascular: Normal rate, regular rhythm, normal heart sounds and intact distal pulses.  Pulmonary/Chest: Effort normal and breath sounds normal.  Neurological: He is alert and oriented to person, place, and time. No cranial nerve deficit. He exhibits normal muscle tone. Coordination normal.  Multiple times during our conversation he would be in the middle of a sentence and have to pause for 10 to 30 seconds before he could remember the words he wanted to say.          Assessment & Plan:  He clearly has had impaired cognition since the stroke, and the Ritalin has greatly helped this. We have resubmitted an appeal to his insurance company to ave this covered. His HTN is stable.  Alysia Penna, MD

## 2018-02-19 NOTE — Telephone Encounter (Signed)
Prior auth sent to [email or web address] [id number].

## 2018-02-19 NOTE — Telephone Encounter (Signed)
See prior note-resubmitted with different diagnosis per Dr [name].

## 2018-02-24 NOTE — Telephone Encounter (Signed)
This fax was placed in the green folder for review.  Please advise. Thanks

## 2018-02-24 NOTE — Telephone Encounter (Signed)
Fax received from [facility] stating the request was cancelled due to denial and this was given to Dr Barbie Banner asst.

## 2018-02-25 NOTE — Telephone Encounter (Signed)
I would have 2 suggestions for him. Either he pay for the Ritalin LA in cash and leave the insurance company out of it or we can see if they will pay for immediate release Ritalin

## 2018-03-01 NOTE — Telephone Encounter (Signed)
Called and spoke with pt and he stated that he is not able to pay out right for the medication and would like to see if the insurance will cover the immediate release ritalin.  Dr. [name] please advise. Thanks

## 2018-03-02 ENCOUNTER — Other Ambulatory Visit: Payer: Self-pay | Admitting: Family Medicine

## 2018-03-02 MED ORDER — METHYLPHENIDATE HCL 20 MG PO TABS: 20.0000 mg | ORAL_TABLET | Freq: Two times a day (BID) | ORAL | 0 refills | Status: DC

## 2018-03-02 NOTE — Telephone Encounter (Signed)
Patient called back frustrated to check status of medication refill for ritalin- advised to contact office by pharmacy. Contacted pharmacy and was advised that IR ritalin needs prior auth. Pharmacy states that they will refax form.

## 2018-03-02 NOTE — Telephone Encounter (Signed)
Copied from [place] [id number]. Topic: Quick Communication - Rx Refill/Question >> Mar 02, 2018  4:13 PM Sheran Luz wrote: Medication: tamsulosin (FLOMAX) 0.4 MG CAPS capsule and finasteride (PROSCAR) 5 MG tablet   Patient is requesting refills of these medications. Please advise.    Preferred Pharmacy (with phone number or street name): [facility] [address], [place] [id number] (Phone) [phone] (Fax)

## 2018-03-02 NOTE — Telephone Encounter (Signed)
I sent in for IR ritalin, we will see if his insurance covers it

## 2018-03-02 NOTE — Telephone Encounter (Signed)
Dr. [name] please advise if we can refill the  proscar and the tamsulosin for this pt. He stated that he has not seen urology in over a year and they will no longer refill these for him.  Please advise. Thanks

## 2018-03-02 NOTE — Addendum Note (Signed)
Addended by: Alysia Penna A on: 03/02/2018 08:15 AM   Modules accepted: Orders

## 2018-03-03 MED ORDER — TAMSULOSIN HCL 0.4 MG PO CAPS: 0.4000 mg | ORAL_CAPSULE | Freq: Every day | ORAL | 1 refills | Status: DC

## 2018-03-03 MED ORDER — FINASTERIDE 5 MG PO TABS: 5.0000 mg | ORAL_TABLET | Freq: Every day | ORAL | 0 refills | Status: DC

## 2018-03-03 NOTE — Telephone Encounter (Signed)
Pt called back in to speak with assistant that is working on PA for methylphenidate (RITALIN) 20 MG tablet.   Pt is requesting a call back at: [phone]

## 2018-03-03 NOTE — Telephone Encounter (Signed)
Pt called to check status of PA. He states ins co is requiring PA to specifically state: Patient cannot function at work without medication.  Patient needs medication as soon as possible. Has been trying to get since October.   JoAnne - please resubmit PA with above information. Thanks!

## 2018-03-08 NOTE — Telephone Encounter (Signed)
Prior auth for Methylphenidate LA 20mg  capsules sent to [email or web address]-key [id number].

## 2018-03-09 ENCOUNTER — Telehealth: Payer: Self-pay | Admitting: Family Medicine

## 2018-03-09 NOTE — Telephone Encounter (Signed)
Pt also called to ask about PA and is very upset that this has not been approved yet. He states he cannot function without the medication.

## 2018-03-09 NOTE — Telephone Encounter (Signed)
Pt states diagnosis must include ADHD, and that it affects his ability to perform at his job.

## 2018-03-09 NOTE — Telephone Encounter (Signed)
The PA for the Methylphenidate ER has been denied by the insurance company.  We are waiting for response back from the insurance company about the methylphenidate 20 mg  Tablets.

## 2018-03-09 NOTE — Telephone Encounter (Signed)
Copied from [place] [id number]. Topic: Quick Communication - See Telephone Encounter >> Mar 09, 2018 12:39 PM Vernona Rieger wrote: CRM for notification. See Telephone encounter for: 03/09/18.  Christy with [facility] [employer] called in regards to the prior authorization for methylphenidate (RITALIN) 20 MG tablet, she will be faxing something over. It was denied due to diagnosis. Please Advise.

## 2018-03-10 ENCOUNTER — Other Ambulatory Visit: Payer: Self-pay

## 2018-03-10 ENCOUNTER — Telehealth: Payer: Self-pay | Admitting: Family Medicine

## 2018-03-10 MED ORDER — METHYLPHENIDATE HCL 20 MG PO TABS: 20.0000 mg | ORAL_TABLET | Freq: Two times a day (BID) | ORAL | 0 refills | Status: DC

## 2018-03-10 NOTE — Telephone Encounter (Signed)
Copied from [place] [id number]. Topic: Compliment - Staff >> Mar 10, 2018  4:25 PM Blase Mess A wrote: Date of encounter: 03/10/18 Details of compliment: Patient is calling to compliment Tillie Rung and he is so very thankful that she was able to assist him with his medication refill has he has had difficulties getting for 40 days. He started qaround January 26, 2018 Who would the patient like to thank/see rewarded? Tillie Rung, Patient feels that Tillie Rung deserves a raise. And feels that Dr. [name] has a part and never had a better doctor!! On a scale of 1-10, how was your experience? 10 Perfect  Route to Engineer, building services.

## 2018-03-24 ENCOUNTER — Emergency Department (HOSPITAL_COMMUNITY): Payer: BLUE CROSS/BLUE SHIELD

## 2018-03-24 ENCOUNTER — Encounter (HOSPITAL_COMMUNITY): Payer: Self-pay

## 2018-03-24 ENCOUNTER — Other Ambulatory Visit: Payer: Self-pay

## 2018-03-24 ENCOUNTER — Emergency Department (HOSPITAL_COMMUNITY)
Admission: EM | Admit: 2018-03-24 | Discharge: 2018-03-24 | Disposition: A | Discharge status: Home / Self Care | Payer: BLUE CROSS/BLUE SHIELD | Attending: Emergency Medicine | Admitting: Emergency Medicine

## 2018-03-24 DIAGNOSIS — M79601 Pain in right arm: Secondary | ICD-10-CM | POA: Insufficient documentation

## 2018-03-24 DIAGNOSIS — I639 Cerebral infarction, unspecified: Secondary | ICD-10-CM | POA: Diagnosis not present

## 2018-03-24 DIAGNOSIS — Z87891 Personal history of nicotine dependence: Secondary | ICD-10-CM | POA: Diagnosis not present

## 2018-03-24 DIAGNOSIS — R531 Weakness: Secondary | ICD-10-CM | POA: Diagnosis not present

## 2018-03-24 DIAGNOSIS — I2510 Atherosclerotic heart disease of native coronary artery without angina pectoris: Secondary | ICD-10-CM | POA: Insufficient documentation

## 2018-03-24 DIAGNOSIS — H5711 Ocular pain, right eye: Secondary | ICD-10-CM | POA: Diagnosis not present

## 2018-03-24 DIAGNOSIS — I10 Essential (primary) hypertension: Secondary | ICD-10-CM | POA: Diagnosis not present

## 2018-03-24 DIAGNOSIS — Z7982 Long term (current) use of aspirin: Secondary | ICD-10-CM | POA: Diagnosis not present

## 2018-03-24 DIAGNOSIS — Z79899 Other long term (current) drug therapy: Secondary | ICD-10-CM | POA: Insufficient documentation

## 2018-03-24 DIAGNOSIS — G459 Transient cerebral ischemic attack, unspecified: Secondary | ICD-10-CM | POA: Diagnosis not present

## 2018-03-24 DIAGNOSIS — I4510 Unspecified right bundle-branch block: Secondary | ICD-10-CM | POA: Diagnosis not present

## 2018-03-24 DIAGNOSIS — R52 Pain, unspecified: Secondary | ICD-10-CM | POA: Diagnosis not present

## 2018-03-24 LAB — CBC WITH DIFFERENTIAL/PLATELET
Abs Immature Granulocytes: 0.02 10*3/uL (ref 0.00–0.07)
Basophils Absolute: 0 10*3/uL (ref 0.0–0.1)
Basophils Relative: 1 %
Eosinophils Absolute: 0.3 10*3/uL (ref 0.0–0.5)
Eosinophils Relative: 4 %
HCT: 40.4 % (ref 39.0–52.0)
Hemoglobin: 12.8 g/dL — ABNORMAL LOW (ref 13.0–17.0)
Immature Granulocytes: 0 %
Lymphocytes Relative: 33 %
Lymphs Abs: 2.5 10*3/uL (ref 0.7–4.0)
MCH: 28.4 pg (ref 26.0–34.0)
MCHC: 31.7 g/dL (ref 30.0–36.0)
MCV: 89.6 fL (ref 80.0–100.0)
Monocytes Absolute: 0.5 10*3/uL (ref 0.1–1.0)
Monocytes Relative: 7 %
Neutro Abs: 4.3 10*3/uL (ref 1.7–7.7)
Neutrophils Relative %: 55 %
Platelets: 156 10*3/uL (ref 150–400)
RBC: 4.51 MIL/uL (ref 4.22–5.81)
RDW: 12.1 % (ref 11.5–15.5)
WBC: 7.7 10*3/uL (ref 4.0–10.5)
nRBC: 0 % (ref 0.0–0.2)

## 2018-03-24 LAB — COMPREHENSIVE METABOLIC PANEL
ALT: 32 U/L (ref 0–44)
AST: 23 U/L (ref 15–41)
Albumin: 3.8 g/dL (ref 3.5–5.0)
Alkaline Phosphatase: 45 U/L (ref 38–126)
Anion gap: 10 (ref 5–15)
BUN: 13 mg/dL (ref 6–20)
CO2: 24 mmol/L (ref 22–32)
Calcium: 8.7 mg/dL — ABNORMAL LOW (ref 8.9–10.3)
Chloride: 107 mmol/L (ref 98–111)
Creatinine, Ser: 0.9 mg/dL (ref 0.61–1.24)
GFR calc Af Amer: >60 mL/min (ref >60)
GFR calc non Af Amer: >60 mL/min (ref >60)
Glucose, Bld: 122 mg/dL — ABNORMAL HIGH (ref 70–99)
Potassium: 3.9 mmol/L (ref 3.5–5.1)
Sodium: 141 mmol/L (ref 135–145)
Total Bilirubin: 0.7 mg/dL (ref 0.3–1.2)
Total Protein: 6.2 g/dL — ABNORMAL LOW (ref 6.5–8.1)

## 2018-03-24 LAB — I-STAT CHEM 8, ED
BUN: 15 mg/dL (ref 6–20)
Calcium, Ion: 1.08 mmol/L — ABNORMAL LOW (ref 1.15–1.40)
Chloride: 106 mmol/L (ref 98–111)
Creatinine, Ser: 0.8 mg/dL (ref 0.61–1.24)
Glucose, Bld: 114 mg/dL — ABNORMAL HIGH (ref 70–99)
HCT: 37 % — ABNORMAL LOW (ref 39.0–52.0)
Hemoglobin: 12.6 g/dL — ABNORMAL LOW (ref 13.0–17.0)
Potassium: 3.8 mmol/L (ref 3.5–5.1)
Sodium: 141 mmol/L (ref 135–145)
TCO2: 27 mmol/L (ref 22–32)

## 2018-03-24 LAB — PROTIME-INR
INR: 0.97
Prothrombin Time: 12.8 seconds (ref 11.4–15.2)

## 2018-03-24 LAB — CBG MONITORING, ED: Glucose-Capillary: 112 mg/dL — ABNORMAL HIGH (ref 70–99)

## 2018-03-24 LAB — I-STAT TROPONIN, ED: Troponin i, poc: 0 ng/mL (ref 0.00–0.08)

## 2018-03-24 LAB — APTT: aPTT: 28 seconds (ref 24–36)

## 2018-03-24 NOTE — ED Notes (Signed)
Called [facility] and Cancelled Code Stroke per Dr Cheral Marker

## 2018-03-24 NOTE — ED Triage Notes (Signed)
Per [facility] pt had a stroke in May. Pt states he had sharp right eye pain at the onset of that stroke. Pt states that today he had the same right eye pain today around 1050 while walking. Pt denies any other complaints at this time.
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
## 2018-03-24 NOTE — Discharge Instructions (Signed)
Follow-up with your neurologist in the office.  Return to the ED for any other concerning symptoms.

## 2018-03-24 NOTE — ED Notes (Signed)
Pt notified RN of recurrent right eye pain while in MRI. MD made aware.

## 2018-03-24 NOTE — Code Documentation (Addendum)
57 yo male coming from work were he was noted to have a sudden onset of right eye pain that started at 1050. Pt had hx of right eye pain that then led to right arm weakness in May and patient was diagnosed with stroke. Pt has deficits of right arm weakness and sensory decreased on the right side. EMS called and activated a Code Stroke. Stroke Team met patient upon arrival. NIHSS 1. CT Head completed. No sign of hemorrhage. Code Stroke cancelled due to Neurology not suspecting stroke. Handoff given to Joellen Jersey, Therapist, sports.

## 2018-03-31 ENCOUNTER — Telehealth: Payer: Self-pay

## 2018-03-31 NOTE — Telephone Encounter (Signed)
Received a fax from [facility] stating that they have received a PA request and are reviewing the request.

## 2018-03-31 NOTE — Telephone Encounter (Signed)
recived a second fax stating that PA request has been canceled due to "member's benefit is not active." papers given to Dr. Barbie Banner assistant.

## 2018-04-12 ENCOUNTER — Telehealth: Payer: Self-pay | Admitting: *Deleted

## 2018-04-12 DIAGNOSIS — H5712 Ocular pain, left eye: Secondary | ICD-10-CM | POA: Diagnosis not present

## 2018-04-12 MED ORDER — METHYLPHENIDATE HCL 20 MG PO TABS: 20.0000 mg | ORAL_TABLET | Freq: Two times a day (BID) | ORAL | 0 refills | Status: DC

## 2018-04-12 NOTE — Telephone Encounter (Signed)
Copied from [place] [id number]. Topic: General - Other >> Apr 09, 2018  3:33 PM Carolyn Stare wrote:  Pt said with his insurance changing he would like to pick up a paper copy for the belowm RX  methylphenidate (RITALIN) 20 MG tablet

## 2018-04-12 NOTE — Telephone Encounter (Signed)
Done

## 2018-04-14 ENCOUNTER — Other Ambulatory Visit: Payer: Self-pay | Admitting: Family Medicine

## 2018-04-15 ENCOUNTER — Telehealth: Payer: Self-pay

## 2018-04-15 NOTE — Telephone Encounter (Signed)
Copied from [place] [id number]. Topic: General - Other >> Apr 15, 2018  9:49 AM Yvette Rack wrote: Reason for CRM: Pt stated he was able to get a refill on the methylphenidate (RITALIN) 20 MG XR capsule. Pt states he no longer needs refill. Cb# [phone]

## 2018-04-15 NOTE — Telephone Encounter (Signed)
Patient called in stating that he has been able to get a refill.

## 2018-04-19 ENCOUNTER — Encounter: Payer: Self-pay | Admitting: Family Medicine

## 2018-04-19 ENCOUNTER — Ambulatory Visit (INDEPENDENT_AMBULATORY_CARE_PROVIDER_SITE_OTHER): Payer: BLUE CROSS/BLUE SHIELD | Admitting: Family Medicine

## 2018-04-19 VITALS — BP 122/80 | HR 67 | Temp 98.0°F | Ht 73.0 in | Wt 235.2 lb

## 2018-04-19 DIAGNOSIS — Z125 Encounter for screening for malignant neoplasm of prostate: Secondary | ICD-10-CM | POA: Diagnosis not present

## 2018-04-19 DIAGNOSIS — Z23 Encounter for immunization: Secondary | ICD-10-CM

## 2018-04-19 DIAGNOSIS — Z0000 Encounter for general adult medical examination without abnormal findings: Secondary | ICD-10-CM

## 2018-04-19 LAB — TSH: TSH: 2.09 u[IU]/mL (ref 0.35–4.50)

## 2018-04-19 LAB — CBC WITH DIFFERENTIAL/PLATELET
Basophils Absolute: 0.1 10*3/uL (ref 0.0–0.1)
Basophils Relative: 0.7 % (ref 0.0–3.0)
Eosinophils Absolute: 0.3 10*3/uL (ref 0.0–0.7)
Eosinophils Relative: 3 % (ref 0.0–5.0)
HCT: 45.2 % (ref 39.0–52.0)
Hemoglobin: 15.2 g/dL (ref 13.0–17.0)
Lymphocytes Relative: 30.6 % (ref 12.0–46.0)
Lymphs Abs: 2.9 10*3/uL (ref 0.7–4.0)
MCHC: 33.6 g/dL (ref 30.0–36.0)
MCV: 86.7 fl (ref 78.0–100.0)
Monocytes Absolute: 0.5 10*3/uL (ref 0.1–1.0)
Monocytes Relative: 5.5 % (ref 3.0–12.0)
Neutro Abs: 5.8 10*3/uL (ref 1.4–7.7)
Neutrophils Relative %: 60.2 % (ref 43.0–77.0)
Platelets: 195 10*3/uL (ref 150.0–400.0)
RBC: 5.22 Mil/uL (ref 4.22–5.81)
RDW: 12.9 % (ref 11.5–15.5)
WBC: 9.6 10*3/uL (ref 4.0–10.5)

## 2018-04-19 LAB — BASIC METABOLIC PANEL
BUN: 17 mg/dL (ref 6–23)
CO2: 28 mEq/L (ref 19–32)
Calcium: 9.8 mg/dL (ref 8.4–10.5)
Chloride: 104 mEq/L (ref 96–112)
Creatinine, Ser: 0.89 mg/dL (ref 0.40–1.50)
GFR: 92.82 mL/min (ref >60.00)
Glucose, Bld: 97 mg/dL (ref 70–99)
Potassium: 4.7 mEq/L (ref 3.5–5.1)
Sodium: 139 mEq/L (ref 135–145)

## 2018-04-19 LAB — POC URINALSYSI DIPSTICK (AUTOMATED)
Bilirubin, UA: NEGATIVE
Blood, UA: NEGATIVE
Glucose, UA: NEGATIVE
Ketones, UA: NEGATIVE
Leukocytes, UA: NEGATIVE
Nitrite, UA: NEGATIVE
Protein, UA: NEGATIVE
Spec Grav, UA: 1.025 (ref 1.010–1.025)
Urobilinogen, UA: 0.2 E.U./dL
pH, UA: 5.5 (ref 5.0–8.0)

## 2018-04-19 LAB — LIPID PANEL
Cholesterol: 149 mg/dL (ref 0–200)
HDL: 36.7 mg/dL — ABNORMAL LOW (ref >39.00)
LDL Cholesterol: 83 mg/dL (ref 0–99)
NonHDL: 112.64
Total CHOL/HDL Ratio: 4
Triglycerides: 148 mg/dL (ref 0.0–149.0)
VLDL: 29.6 mg/dL (ref 0.0–40.0)

## 2018-04-19 LAB — HEPATIC FUNCTION PANEL
ALT: 28 U/L (ref 0–53)
AST: 17 U/L (ref 0–37)
Albumin: 4.7 g/dL (ref 3.5–5.2)
Alkaline Phosphatase: 61 U/L (ref 39–117)
Bilirubin, Direct: 0.1 mg/dL (ref 0.0–0.3)
Total Bilirubin: 0.4 mg/dL (ref 0.2–1.2)
Total Protein: 7.2 g/dL (ref 6.0–8.3)

## 2018-04-19 LAB — PSA: PSA: 15.16 ng/mL — ABNORMAL HIGH (ref 0.10–4.00)

## 2018-04-19 MED ORDER — SILDENAFIL CITRATE 100 MG PO TABS: 100.0000 mg | ORAL_TABLET | Freq: Every day | ORAL | 11 refills | Status: DC | PRN

## 2018-04-19 MED ORDER — METHYLPHENIDATE HCL ER (LA) 20 MG PO CP24: 20.0000 mg | ORAL_CAPSULE | ORAL | 0 refills | Status: DC

## 2018-04-19 NOTE — Progress Notes (Signed)
Subjective:    Patient ID: Riley Hartman, male    DOB: 1961-01-20, 58 y.o.   MRN: [id number]  HPI Here for a well exam. He feels well in general. He is happy with the Ritalin but he does better with XR. Fortunately his insurance company will now cover this. He is recovering from his stroke but he still has some deficits. He describes frequently losing his train of thought when he is speaking. He has a slightly unsteady gait, and he has fallen a few times when he was not paying attention to his movements. Also he notes a constant sensation of tightness in the right arm and hand. He is working full time again but with 2 restrictions. He cannot go up or down ladders , and he cannot move more than 20 lbs up or down steps.    Review of Systems  Constitutional: Negative.   HENT: Negative.   Eyes: Negative.   Respiratory: Negative.   Cardiovascular: Negative.   Gastrointestinal: Negative.   Genitourinary: Negative.   Musculoskeletal: Positive for gait problem.  Skin: Negative.   Neurological: Positive for speech difficulty.  Psychiatric/Behavioral: Negative.        Objective:   Physical Exam Constitutional:      General: He is not in acute distress.    Appearance: He is well-developed. He is not diaphoretic.  HENT:     Head: Normocephalic and atraumatic.     Right Ear: External ear normal.     Left Ear: External ear normal.     Nose: Nose normal.     Mouth/Throat:     Pharynx: No oropharyngeal exudate.  Eyes:     General: No scleral icterus.       Right eye: No discharge.        Left eye: No discharge.     Conjunctiva/sclera: Conjunctivae normal.     Pupils: Pupils are equal, round, and reactive to light.  Neck:     Musculoskeletal: Neck supple.     Thyroid: No thyromegaly.     Vascular: No JVD.     Trachea: No tracheal deviation.  Cardiovascular:     Rate and Rhythm: Normal rate and regular rhythm.     Heart sounds: Normal heart sounds. No murmur. No friction rub. No  gallop.   Pulmonary:     Effort: Pulmonary effort is normal. No respiratory distress.     Breath sounds: Normal breath sounds. No wheezing or rales.  Chest:     Chest wall: No tenderness.  Abdominal:     General: Bowel sounds are normal. There is no distension.     Palpations: Abdomen is soft. There is no mass.     Tenderness: There is no abdominal tenderness. There is no guarding or rebound.  Genitourinary:    Penis: Normal. No tenderness.      Prostate: Normal.     Rectum: Normal. Guaiac result negative.  Musculoskeletal: Normal range of motion.        General: No tenderness.  Lymphadenopathy:     Cervical: No cervical adenopathy.  Skin:    General: Skin is warm and dry.     Coloration: Skin is not pale.     Findings: No erythema or rash.  Neurological:     Mental Status: He is alert and oriented to person, place, and time.     Cranial Nerves: No cranial nerve deficit.     Motor: No abnormal muscle tone.     Coordination: Coordination normal.  Deep Tendon Reflexes: Reflexes are normal and symmetric. Reflexes normal.  Psychiatric:        Behavior: Behavior normal.        Thought Content: Thought content normal.        Judgment: Judgment normal.           Assessment & Plan:  Well exam. We discussed diet and exercise. Get fasting labs. We wrote to get back on Ritalin XR.  Alysia Penna, MD

## 2018-04-22 ENCOUNTER — Encounter: Payer: Self-pay | Admitting: *Deleted

## 2018-05-29 ENCOUNTER — Other Ambulatory Visit: Payer: Self-pay | Admitting: Family Medicine

## 2018-06-24 ENCOUNTER — Other Ambulatory Visit: Payer: Self-pay

## 2018-06-24 ENCOUNTER — Ambulatory Visit (INDEPENDENT_AMBULATORY_CARE_PROVIDER_SITE_OTHER): Payer: BLUE CROSS/BLUE SHIELD | Admitting: Family Medicine

## 2018-06-24 ENCOUNTER — Encounter: Payer: Self-pay | Admitting: Family Medicine

## 2018-06-24 VITALS — BP 128/84 | HR 80 | Temp 97.7°F | Wt 223.0 lb

## 2018-06-24 DIAGNOSIS — J019 Acute sinusitis, unspecified: Secondary | ICD-10-CM | POA: Diagnosis not present

## 2018-06-24 MED ORDER — AZITHROMYCIN 250 MG PO TABS: ORAL_TABLET | ORAL | 0 refills | Status: DC

## 2018-06-24 MED ORDER — LISINOPRIL 20 MG PO TABS: 20.0000 mg | ORAL_TABLET | Freq: Every day | ORAL | 3 refills | Status: DC

## 2018-06-24 NOTE — Progress Notes (Signed)
   Subjective:    Patient ID: Riley Hartman, male    DOB: 05-12-1960, 58 y.o.   MRN: [id number]  HPI Here for 3 weeks of sinus pressure, PND, blowing mucus from the nose and a dry cough. No fever. On Mucinex D.    Review of Systems  Constitutional: Negative.   HENT: Positive for congestion, postnasal drip and sinus pressure. Negative for sore throat.   Eyes: Negative.   Respiratory: Positive for cough.        Objective:   Physical Exam Constitutional:      Appearance: Normal appearance.  HENT:     Right Ear: Tympanic membrane and ear canal normal.     Left Ear: Tympanic membrane and ear canal normal.     Nose: Nose normal.     Mouth/Throat:     Pharynx: Oropharynx is clear.  Eyes:     Conjunctiva/sclera: Conjunctivae normal.  Pulmonary:     Effort: Pulmonary effort is normal.     Breath sounds: Normal breath sounds.  Lymphadenopathy:     Cervical: No cervical adenopathy.  Neurological:     Mental Status: He is alert.           Assessment & Plan:  Sinusitis, treat with a Zpack.  Alysia Penna, MD

## 2018-06-30 ENCOUNTER — Ambulatory Visit: Payer: BLUE CROSS/BLUE SHIELD | Admitting: Adult Health

## 2018-07-28 ENCOUNTER — Encounter: Payer: BLUE CROSS/BLUE SHIELD | Admitting: Family Medicine

## 2018-08-06 ENCOUNTER — Encounter: Payer: Self-pay | Admitting: Family Medicine

## 2018-08-06 ENCOUNTER — Ambulatory Visit (INDEPENDENT_AMBULATORY_CARE_PROVIDER_SITE_OTHER): Payer: BLUE CROSS/BLUE SHIELD

## 2018-08-06 ENCOUNTER — Ambulatory Visit (INDEPENDENT_AMBULATORY_CARE_PROVIDER_SITE_OTHER): Payer: BLUE CROSS/BLUE SHIELD | Admitting: Family Medicine

## 2018-08-06 ENCOUNTER — Other Ambulatory Visit: Payer: Self-pay

## 2018-08-06 DIAGNOSIS — M545 Low back pain, unspecified: Secondary | ICD-10-CM

## 2018-08-06 MED ORDER — METHYLPREDNISOLONE 4 MG PO TBPK: ORAL_TABLET | ORAL | 0 refills | Status: DC

## 2018-08-06 NOTE — Progress Notes (Signed)
   Subjective:    Patient ID: Riley Hartman, male    DOB: Sep 27, 1960, 58 y.o.   MRN: [id number]  HPI Virtual Visit via Telephone Note  I connected with the patient on 08/06/18 at  2:00 PM EDT by telephone and verified that I am speaking with the correct person using two identifiers. We attempted to speak via [email or web address], but we had technical difficulties with the audio and video.    I discussed the limitations, risks, security and privacy concerns of performing an evaluation and management service by telephone and the availability of in person appointments. I also discussed with the patient that there may be a patient responsible charge related to this service. The patient expressed understanding and agreed to proceed.  Location patient: home Location provider: work or home office Participants present for the call: patient, provider Patient did not have a visit in the prior 7 days to address this/these issue(s).   History of Present Illness: He started to have low back pain several weeks go, but this became more intense 4 days ago. No recent trauma. No radiation to the legs. Using ice, heat, and stretches.    Observations/Objective: Patient sounds cheerful and well on the phone. I do not appreciate any SOB. Speech and thought processing are grossly intact. Patient reported vitals:  Assessment and Plan: Low back pain, treat with a Medrol dose pack. He will come in for lumbar spine Xrays later today.  Alysia Penna, MD   Follow Up Instructions:     [phone] 5-10 [phone] 11-20 [id number] 21-30 I did not refer this patient for an OV in the next 24 hours for this/these issue(s).  I discussed the assessment and treatment plan with the patient. The patient was provided an opportunity to ask questions and all were answered. The patient agreed with the plan and demonstrated an understanding of the instructions.   The patient was advised to call back or seek an in-person evaluation if the symptoms  worsen or if the condition fails to improve as anticipated.  I provided 13 minutes of non-face-to-face time during this encounter.   Alysia Penna, MD    Review of Systems     Objective:   Physical Exam        Assessment & Plan:

## 2018-08-09 ENCOUNTER — Telehealth: Payer: Self-pay | Admitting: Family Medicine

## 2018-08-09 NOTE — Telephone Encounter (Signed)
Pt is calling about his xray results.  Please advise. Thanks

## 2018-08-09 NOTE — Telephone Encounter (Signed)
Dr. [name] please advise if the pt is to take the ritalin BID as pt says this was discussed at the last visit.  Thanks

## 2018-08-09 NOTE — Telephone Encounter (Signed)
Copied from [place] [id number]. Topic: Quick Communication - Rx Refill/Question >> Aug 09, 2018  1:44 PM Izola Price, [place] A wrote: Medication: methylphenidate (RITALIN LA) 20 MG 24 hr capsule (Patient stated that he discussed taking medication twice a day on his last visit.)  Has the patient contacted their pharmacy? Yes (Agent: If no, request that the patient contact the pharmacy for the refill.) (Agent: If yes, when and what did the pharmacy advise?)Contact PCP  Preferred Pharmacy (with phone number or street name): [id number] - [place], [place]. [address] [phone] (Phone) [phone] (Fax)    Agent: Please be advised that RX refills may take up to 3 business days. We ask that you follow-up with your pharmacy.

## 2018-08-09 NOTE — Telephone Encounter (Signed)
Patient called wanting to know the results from his X-Rays that were done Friday.  Please Advise

## 2018-08-10 MED ORDER — METHYLPHENIDATE HCL ER (LA) 20 MG PO CP24: 20.0000 mg | ORAL_CAPSULE | Freq: Two times a day (BID) | ORAL | 0 refills | Status: DC

## 2018-08-10 NOTE — Telephone Encounter (Signed)
Called and spoke with pt and he  Is aware of refills that have been sent to the pharmacy.

## 2018-08-10 NOTE — Telephone Encounter (Signed)
See my Result Note  

## 2018-08-10 NOTE — Telephone Encounter (Signed)
Yes we agreed to go up to BID. I sent in 3 months of new refills to reflect this change

## 2018-08-18 ENCOUNTER — Telehealth: Payer: Self-pay | Admitting: Family Medicine

## 2018-08-18 NOTE — Telephone Encounter (Signed)
Copied from [place] [phone]. Topic: Quick Communication - Rx Refill/Question >> Aug 18, 2018  1:48 PM Alanda Slim E wrote: Medication: methylphenidate (RITALIN LA) 20 MG 24 hr capsule-Pt did not pick up Rx sent in on 4.28.20 and Rx needs to be resent to the pharmacy due to pharmacy stating they have a Rx with the wrong sig/ Pt wants to make sure it is the extended release capsules and not the tablets and Rx needs a PA  Has the patient contacted their pharmacy? Yes - - Have pcp sen in new RX   Preferred Pharmacy (with phone number or street name):  [id number] - [place], [place]. [address] [phone] (Phone) [phone] (Fax)   Agent: Please be advised that RX refills may take up to 3 business days. We ask that you follow-up with your pharmacy.

## 2018-08-19 NOTE — Telephone Encounter (Signed)
Dr. [name] please Arnoldo Hooker thanks

## 2018-08-20 ENCOUNTER — Telehealth: Payer: Self-pay

## 2018-08-20 NOTE — Telephone Encounter (Signed)
Pharmacy sent over recs about meds---  Stated that his insurance will only cover the 1 a day for the concerta but the pt stated that if we do the PA they will cover this.  Dr. [name] please advise. Thanks

## 2018-08-20 NOTE — Telephone Encounter (Signed)
This was approved

## 2018-08-20 NOTE — Telephone Encounter (Signed)
Patient called to request a call back to discuss his medication methylphenidate (RITALIN LA) 20 MG 24 hr capsule. Per patient it is impertinent that he speak to someone. Please call    Ph# [phone]

## 2018-08-20 NOTE — Telephone Encounter (Signed)
PA for methylphenidate (RITALIN LA) 20 MG 24 hr capsule has been sent to cover my meds.  Key: [id number]

## 2018-08-20 NOTE — Telephone Encounter (Signed)
Pt calling again states that he must speak with Dr. Barbie Banner nurse today regarding prior auth.

## 2018-08-23 NOTE — Telephone Encounter (Signed)
Patient is calling in stating he would like a call back from Dr.Fry to discuss the PA. Call back is [phone].

## 2018-08-24 ENCOUNTER — Telehealth: Payer: Self-pay | Admitting: Family Medicine

## 2018-08-24 MED ORDER — METHYLPHENIDATE HCL ER 20 MG PO TBCR: 20.0000 mg | EXTENDED_RELEASE_TABLET | Freq: Two times a day (BID) | ORAL | 0 refills | Status: DC

## 2018-08-24 NOTE — Telephone Encounter (Signed)
Pt was on the phone and would not hang up until he spoke with me.    He stated the following:  He wanted to see if Dr. [name] would write for the methylphenidate 20 mg xr capsule for 1 daily.    Give him a paper script to take and use with [facility] for methylphenidate 20 mg  Tablet 1 daily.    [place] stated that he doesn't really like the tablet but he is tired of being out of his meds and [place] having to fight with the insurance company to get his medications.  Dr. [name] please advise. Thanks

## 2018-08-24 NOTE — Telephone Encounter (Signed)
This was changed to the tablet form

## 2018-08-24 NOTE — Telephone Encounter (Signed)
The PA that was approved for the methylphenidate was for tablets and not capsules.  The rx will need to be changed to methylphenidate LA 20 mg tablets  BID  #60 .  Dr. [name] please advise. Thanks

## 2018-08-24 NOTE — Telephone Encounter (Signed)
NO I cannot do that. He needs to pick up the tablets that I sent in to the pharmacy. When these refills run out, we can discuss an alternative medication

## 2018-08-25 ENCOUNTER — Telehealth: Payer: Self-pay | Admitting: *Deleted

## 2018-08-25 ENCOUNTER — Other Ambulatory Visit: Payer: Self-pay

## 2018-08-25 ENCOUNTER — Ambulatory Visit (INDEPENDENT_AMBULATORY_CARE_PROVIDER_SITE_OTHER): Payer: BLUE CROSS/BLUE SHIELD | Admitting: Family Medicine

## 2018-08-25 ENCOUNTER — Encounter: Payer: Self-pay | Admitting: Family Medicine

## 2018-08-25 DIAGNOSIS — E785 Hyperlipidemia, unspecified: Secondary | ICD-10-CM | POA: Diagnosis not present

## 2018-08-25 DIAGNOSIS — R29898 Other symptoms and signs involving the musculoskeletal system: Secondary | ICD-10-CM

## 2018-08-25 DIAGNOSIS — F909 Attention-deficit hyperactivity disorder, unspecified type: Secondary | ICD-10-CM

## 2018-08-25 MED ORDER — METHYLPHENIDATE HCL ER (LA) 20 MG PO CP24: 20.0000 mg | ORAL_CAPSULE | Freq: Two times a day (BID) | ORAL | 0 refills | Status: DC

## 2018-08-25 MED ORDER — ATORVASTATIN CALCIUM 80 MG PO TABS: 40.0000 mg | ORAL_TABLET | Freq: Every day | ORAL | 4 refills | Status: DC

## 2018-08-25 NOTE — Telephone Encounter (Signed)
Copied from [place] [id number]. Topic: General - Other >> Aug 25, 2018 11:29 AM Carolyn Stare wrote:  [facility]phen[facility] (METADATE ER) 20 MG ER tablet   in stock and plus pt request the capsules be sent to the below pharmacy    [facility] [place]
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
## 2018-08-25 NOTE — Telephone Encounter (Signed)
Patient would like a call back from [facility] to discussmethylphenidate 20 mg  script.

## 2018-09-11 ENCOUNTER — Other Ambulatory Visit: Payer: Self-pay | Admitting: Family Medicine

## 2018-10-05 ENCOUNTER — Other Ambulatory Visit: Payer: Self-pay | Admitting: Family Medicine

## 2018-11-23 ENCOUNTER — Telehealth: Payer: Self-pay | Admitting: Family Medicine

## 2018-11-23 NOTE — Telephone Encounter (Signed)
Medication Refill - Medication: methylphenidate (RITALIN LA) 20 MG 24 hr capsule Pt needs a paper script to take to pharmacy due to some not being able to fill/ please call Pt when he can pick Rx script up  Has the patient contacted their pharmacy? No. (Agent: If no, request that the patient contact the pharmacy for the refill.) (Agent: If yes, when and what did the pharmacy advise?)  Preferred Pharmacy (with phone number or street name):   Agent: Please be advised that RX refills may take up to 3 business days. We ask that you follow-up with your pharmacy.

## 2018-11-24 MED ORDER — METHYLPHENIDATE HCL ER (LA) 20 MG PO CP24: 20.0000 mg | ORAL_CAPSULE | Freq: Two times a day (BID) | ORAL | 0 refills | Status: DC

## 2018-11-24 NOTE — Telephone Encounter (Signed)
Last filled 10/25/2018 Last OV 08/25/2018  Ok to fill?

## 2018-11-24 NOTE — Telephone Encounter (Signed)
Ready to pick up.  

## 2018-11-24 NOTE — Telephone Encounter (Signed)
Patient is aware 

## 2018-11-24 NOTE — Telephone Encounter (Signed)
See note

## 2019-01-10 ENCOUNTER — Encounter: Payer: Self-pay | Admitting: Family Medicine

## 2019-01-10 ENCOUNTER — Telehealth (INDEPENDENT_AMBULATORY_CARE_PROVIDER_SITE_OTHER): Payer: BC Managed Care – PPO | Admitting: Family Medicine

## 2019-01-10 ENCOUNTER — Other Ambulatory Visit: Payer: Self-pay

## 2019-01-10 VITALS — Temp 96.3°F | Ht 73.0 in | Wt 223.0 lb

## 2019-01-10 DIAGNOSIS — J0191 Acute recurrent sinusitis, unspecified: Secondary | ICD-10-CM | POA: Diagnosis not present

## 2019-01-10 DIAGNOSIS — Z20822 Contact with and (suspected) exposure to covid-19: Secondary | ICD-10-CM

## 2019-01-10 MED ORDER — AZITHROMYCIN 250 MG PO TABS: ORAL_TABLET | ORAL | 0 refills | Status: DC
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
## 2019-01-11 LAB — SPECIMEN STATUS REPORT

## 2019-01-11 LAB — NOVEL CORONAVIRUS, NAA: SARS-CoV-2, NAA: DETECTED — AB

## 2019-01-12 ENCOUNTER — Telehealth (INDEPENDENT_AMBULATORY_CARE_PROVIDER_SITE_OTHER): Payer: BC Managed Care – PPO | Admitting: Family Medicine

## 2019-01-12 ENCOUNTER — Other Ambulatory Visit: Payer: Self-pay

## 2019-01-12 ENCOUNTER — Encounter: Payer: Self-pay | Admitting: Family Medicine

## 2019-01-12 DIAGNOSIS — U071 COVID-19: Secondary | ICD-10-CM | POA: Diagnosis not present
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
## 2019-01-14 ENCOUNTER — Ambulatory Visit: Payer: BC Managed Care – PPO | Admitting: Family Medicine

## 2019-01-17 IMAGING — CT CT HEAD CODE STROKE
3 of 4 series · 12 of 47 positions shown, 14 images · non-contrast
Comparison: MRI of the head August 18, 2017 and CTA HEAD and neck May

CLINICAL DATA: Code stroke. RIGHT arm heaviness. History of
hypertension, hyperlipidemia.

EXAM:
CT HEAD WITHOUT CONTRAST
TECHNIQUE: Contiguous axial images were obtained from the base of the skull
through the vertex without intravenous contrast.

[Series 3: head 5.0 st · axial · 0.44mm/px · z∈[-161,-51]mm · 6 of 32 slices shown, 8 images]
[im 5/32  brain]
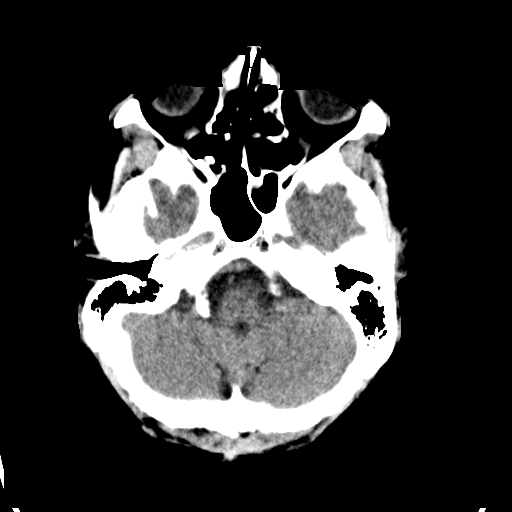
[im 5/32  bone]
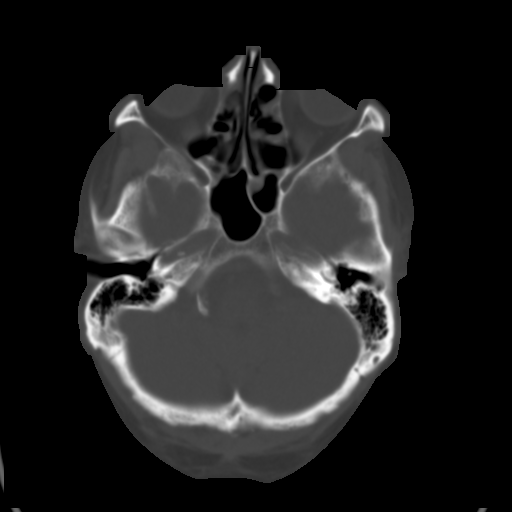
[im 9/32  brain]
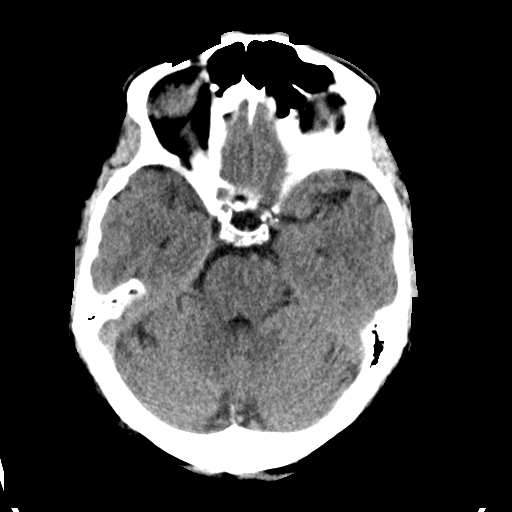
[im 14/32  brain]
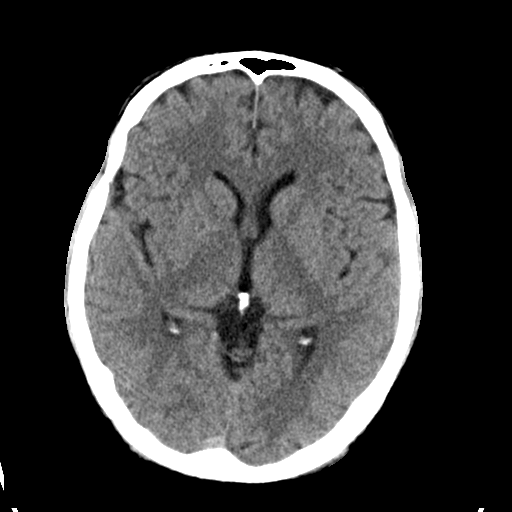
[im 18/32  brain]
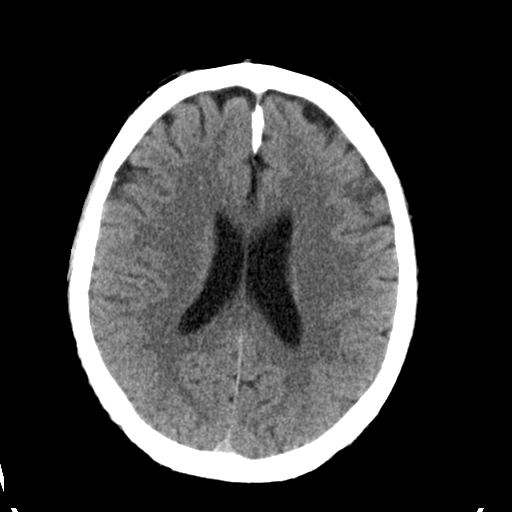
[im 23/32  brain]
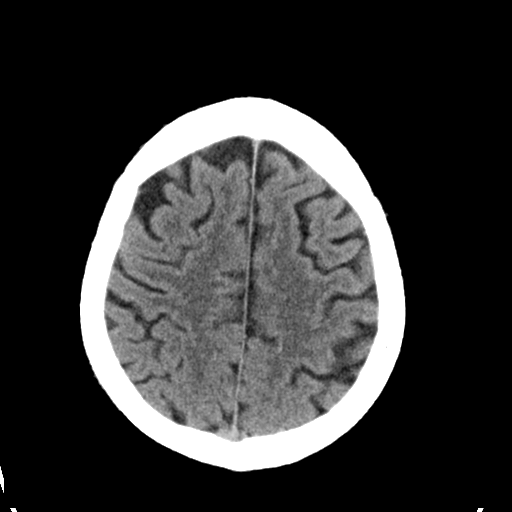
[im 23/32  bone]
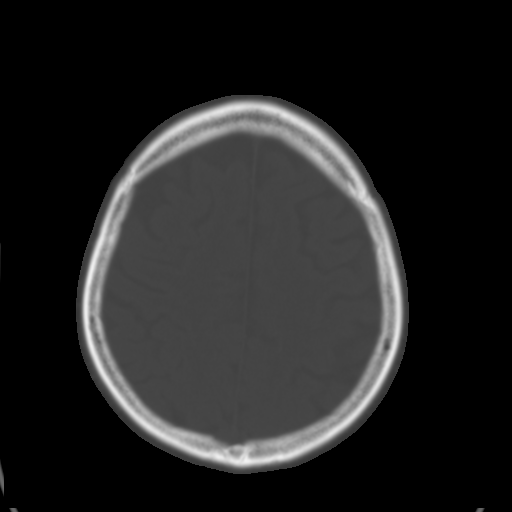
[im 27/32  brain]
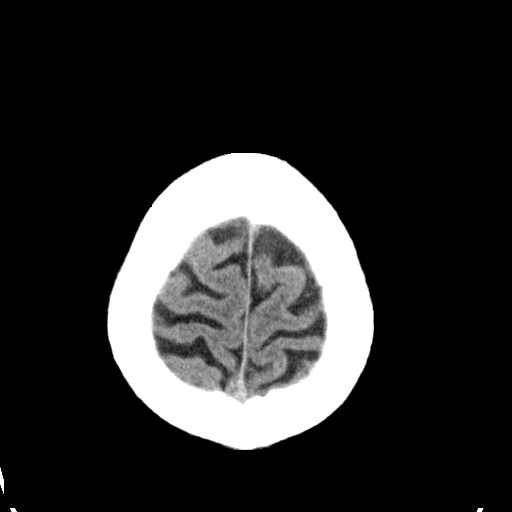

[Series 5: head 3.0 cor st · coronal · 0.34mm/px · 3 of 67 slices shown]
[im 23/67  brain]
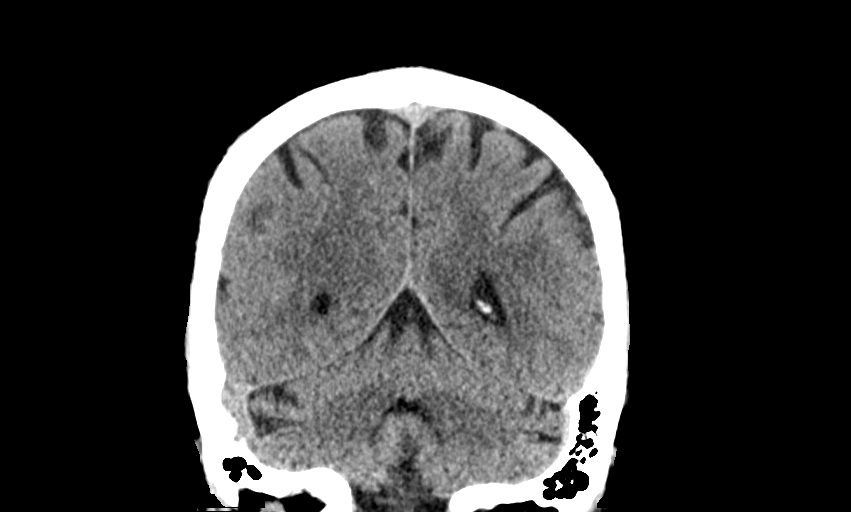
[im 30/67  brain]
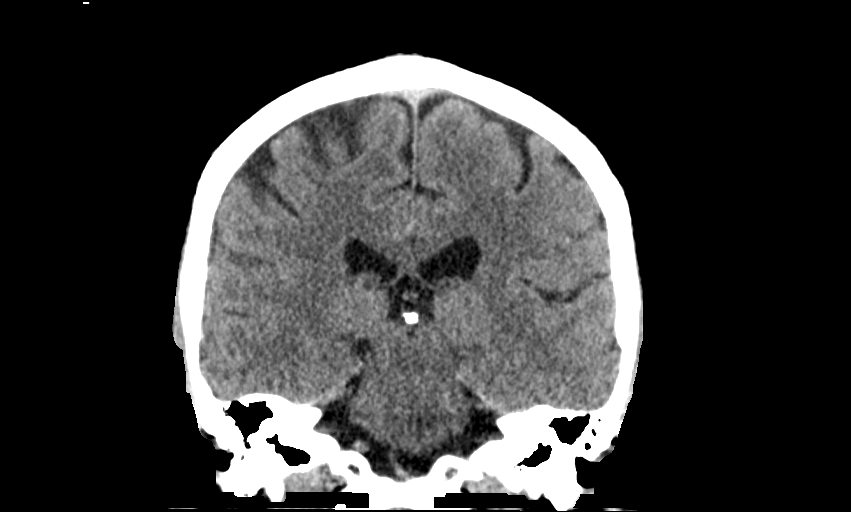
[im 37/67  brain]
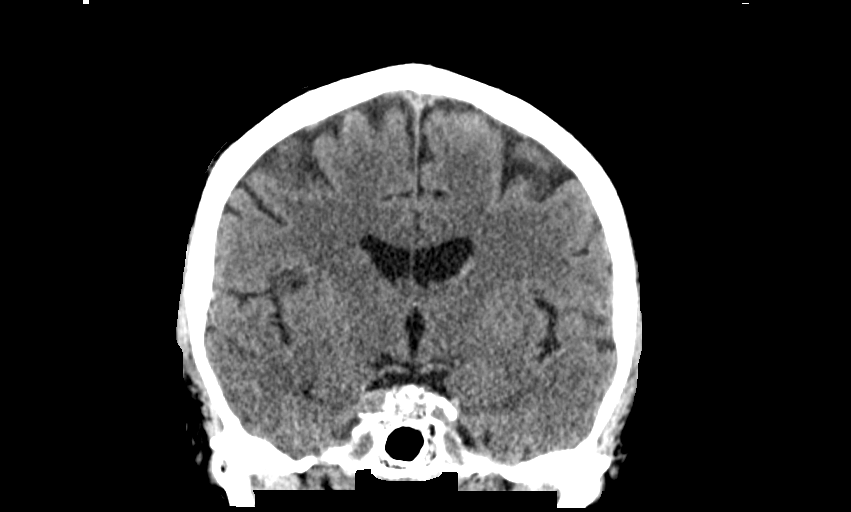

[Series 6: head 3.0 sag st · sagittal · 0.34mm/px · 3 of 67 slices shown]
[im 23/67  brain]
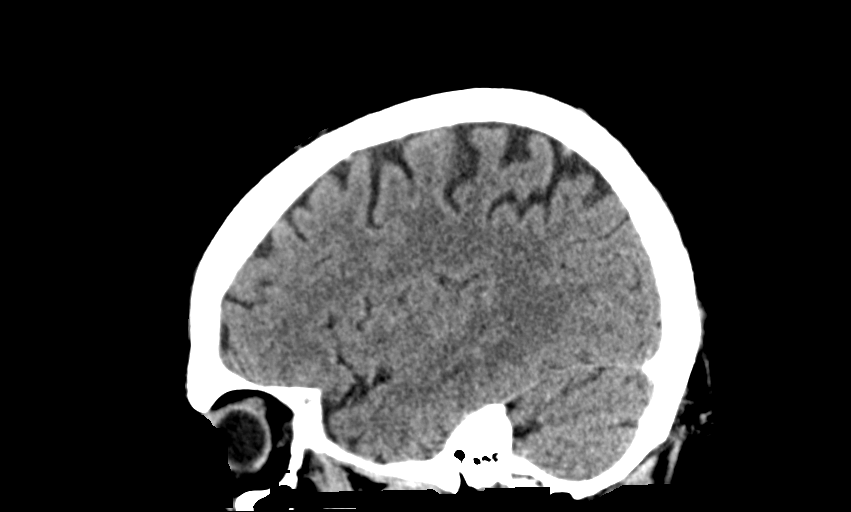
[im 34/67  brain]
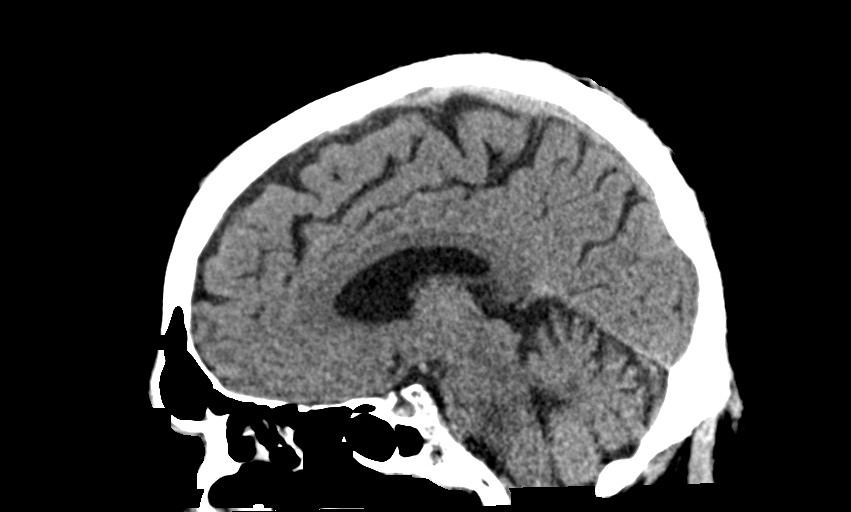
[im 45/67  brain]
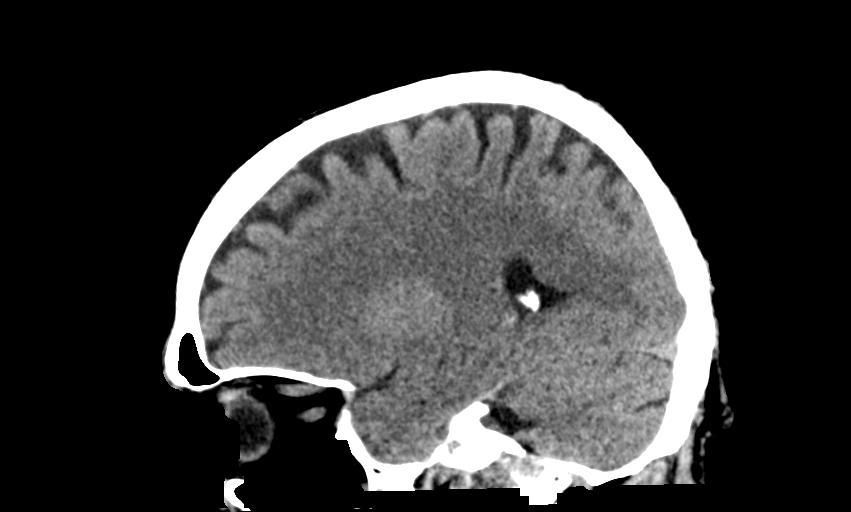

[12 of 47 positions shown; findings below may reference images not displayed]

FINDINGS: BRAIN: No intraparenchymal hemorrhage, mass effect nor midline
shift. Hypodensity LEFT pons corresponding to recent infarct. The
ventricles and sulci are normal. No acute large vascular territory
infarcts. No abnormal extra-axial fluid collections. Basal cisterns
are patent.

VASCULAR: Moderate calcific atherosclerosis and RIGHT vertebral
artery and carotid siphons.

SKULL/SOFT TISSUES: No skull fracture. No significant soft tissue
swelling.

ORBITS/SINUSES: The included ocular globes and orbital contents are
normal.Moderate paranasal sinus mucosal thickening. Included mastoid
air cells are well aerated.

OTHER: None.

ASPECTS (Alberta Stroke Program Early CT Score)

- Ganglionic level infarction (caudate, lentiform nuclei, internal
capsule, insula, M1-M3 cortex): 7

- Supraganglionic infarction (M4-M6 cortex): 3

Total score (0-10 with 10 being normal): 10
IMPRESSION: 1. No acute intracranial process.
2. ASPECTS is 10.
3. Subacute to chronic LEFT pontine infarct.
4. Moderate atherosclerosis, advanced for age.
5. Critical Value/emergent results text paged to Dr.LOCKLEAR, Neurology
via [employer] secure system on 08/31/2017 at [DATE], including
interpreting physician's phone number.

## 2019-01-20 ENCOUNTER — Encounter: Payer: Self-pay | Admitting: Gastroenterology

## 2019-01-29 ENCOUNTER — Other Ambulatory Visit: Payer: Self-pay | Admitting: Family Medicine

## 2019-02-23 ENCOUNTER — Telehealth: Payer: Self-pay | Admitting: Family Medicine

## 2019-02-23 MED ORDER — METHYLPHENIDATE HCL ER (LA) 20 MG PO CP24: 20.0000 mg | ORAL_CAPSULE | Freq: Two times a day (BID) | ORAL | 0 refills | Status: DC

## 2019-02-23 NOTE — Telephone Encounter (Signed)
Requested medication (s) are due for refill today: yes  Requested medication (s) are on the active medication list: yes  Last refill:  01/24/2019  Future visit scheduled: no  Notes to clinic:  Patient request written script    Requested Prescriptions  Pending Prescriptions Disp Refills   methylphenidate (RITALIN LA) 20 MG 24 hr capsule 60 capsule 0    Sig: Take 1 capsule (20 mg total) by mouth 2 (two) times daily.     Not Delegated - Psychiatry:  Stimulants/ADHD Failed - 02/23/2019 12:44 PM      Failed - This refill cannot be delegated      Failed - Urine Drug Screen completed in last 360 days.      Passed - Valid encounter within last 3 months    Recent Outpatient Visits          1 month ago COVID-19 virus infection   [facility], Ishmael Holter, MD   1 month ago Acute recurrent sinusitis, unspecified location   [employer] [facility], Ishmael Holter, MD   6 months ago Attention deficit hyperactivity disorder (ADHD), unspecified ADHD type   [facility]rap[facility] at [place], [place]   6 months ago Acute bilateral low back pain without sciatica   [facility]rapist[facility] at [place], [place]   8 months ago Acute sinusitis, recurrence not specified, unspecified location   [employer] [facility] at [facility], Ishmael Holter, MD

## 2019-02-23 NOTE — Telephone Encounter (Signed)
Done

## 2019-02-23 NOTE — Telephone Encounter (Signed)
Copied from [place] [id number]. Topic: Quick Communication - Rx Refill/Question >> Feb 23, 2019 12:40 PM Yvette Rack wrote: Pt requests printed prescription  Medication: methylphenidate (RITALIN LA) 20 MG 24 hr capsule  Has the patient contacted their pharmacy? no  Preferred Pharmacy (with phone number [facility] street [id number] [place] [place]. [address] [phone] (Phone) [phone] (Fax)  Agent: Please be advised that RX refills may take up to 3 business days. We ask that you follow-up with your pharmacy.

## 2019-02-23 NOTE — Telephone Encounter (Signed)
Okay for refill?  

## 2019-03-02 NOTE — Telephone Encounter (Signed)
Pt stated he requested rx for methylphenidate (RITALIN LA) 20 MG 24 hr capsule to be printed as he always has issues when it is sent in electronically. He would like a callback from [facility] so that [facility] and he can pick up a paper rx today. Please advise.

## 2019-03-08 NOTE — Telephone Encounter (Signed)
This has been taking care of.

## 2019-04-01 ENCOUNTER — Other Ambulatory Visit: Payer: Self-pay | Admitting: Family Medicine

## 2019-05-23 ENCOUNTER — Other Ambulatory Visit: Payer: Self-pay | Admitting: Family Medicine

## 2019-06-15 ENCOUNTER — Other Ambulatory Visit: Payer: Self-pay

## 2019-06-16 ENCOUNTER — Encounter: Payer: Self-pay | Admitting: Family Medicine

## 2019-06-16 ENCOUNTER — Encounter: Payer: Self-pay | Admitting: Gastroenterology

## 2019-06-16 ENCOUNTER — Ambulatory Visit (INDEPENDENT_AMBULATORY_CARE_PROVIDER_SITE_OTHER): Payer: BC Managed Care – PPO | Admitting: Family Medicine

## 2019-06-16 VITALS — BP 122/64 | HR 73 | Temp 97.6°F | Wt 237.6 lb

## 2019-06-16 DIAGNOSIS — Z125 Encounter for screening for malignant neoplasm of prostate: Secondary | ICD-10-CM | POA: Diagnosis not present

## 2019-06-16 DIAGNOSIS — Z0000 Encounter for general adult medical examination without abnormal findings: Secondary | ICD-10-CM | POA: Diagnosis not present

## 2019-06-16 LAB — CBC WITH DIFFERENTIAL/PLATELET
Basophils Absolute: 0.1 10*3/uL (ref 0.0–0.1)
Basophils Relative: 0.6 % (ref 0.0–3.0)
Eosinophils Absolute: 0.2 10*3/uL (ref 0.0–0.7)
Eosinophils Relative: 2.2 % (ref 0.0–5.0)
HCT: 41.5 % (ref 39.0–52.0)
Hemoglobin: 13.8 g/dL (ref 13.0–17.0)
Lymphocytes Relative: 30.9 % (ref 12.0–46.0)
Lymphs Abs: 2.9 10*3/uL (ref 0.7–4.0)
MCHC: 33.2 g/dL (ref 30.0–36.0)
MCV: 88 fl (ref 78.0–100.0)
Monocytes Absolute: 0.4 10*3/uL (ref 0.1–1.0)
Monocytes Relative: 4.7 % (ref 3.0–12.0)
Neutro Abs: 5.7 10*3/uL (ref 1.4–7.7)
Neutrophils Relative %: 61.6 % (ref 43.0–77.0)
Platelets: 193 10*3/uL (ref 150.0–400.0)
RBC: 4.71 Mil/uL (ref 4.22–5.81)
RDW: 13.5 % (ref 11.5–15.5)
WBC: 9.3 10*3/uL (ref 4.0–10.5)

## 2019-06-16 LAB — HEPATIC FUNCTION PANEL
ALT: 27 U/L (ref 0–53)
AST: 20 U/L (ref 0–37)
Albumin: 4.4 g/dL (ref 3.5–5.2)
Alkaline Phosphatase: 53 U/L (ref 39–117)
Bilirubin, Direct: 0.1 mg/dL (ref 0.0–0.3)
Total Bilirubin: 0.4 mg/dL (ref 0.2–1.2)
Total Protein: 6.9 g/dL (ref 6.0–8.3)

## 2019-06-16 LAB — LIPID PANEL
Cholesterol: 156 mg/dL (ref 0–200)
HDL: 33.9 mg/dL — ABNORMAL LOW (ref >39.00)
LDL Cholesterol: 87 mg/dL (ref 0–99)
NonHDL: 121.69
Total CHOL/HDL Ratio: 5
Triglycerides: 171 mg/dL — ABNORMAL HIGH (ref 0.0–149.0)
VLDL: 34.2 mg/dL (ref 0.0–40.0)

## 2019-06-16 LAB — BASIC METABOLIC PANEL
BUN: 21 mg/dL (ref 6–23)
CO2: 30 mEq/L (ref 19–32)
Calcium: 9.7 mg/dL (ref 8.4–10.5)
Chloride: 105 mEq/L (ref 96–112)
Creatinine, Ser: 0.87 mg/dL (ref 0.40–1.50)
GFR: 89.3 mL/min (ref >60.00)
Glucose, Bld: 99 mg/dL (ref 70–99)
Potassium: 4.7 mEq/L (ref 3.5–5.1)
Sodium: 140 mEq/L (ref 135–145)

## 2019-06-16 LAB — TSH: TSH: 1.43 u[IU]/mL (ref 0.35–4.50)

## 2019-06-16 LAB — PSA: PSA: 20.45 ng/mL — ABNORMAL HIGH (ref 0.10–4.00)

## 2019-06-16 MED ORDER — METHYLPHENIDATE HCL ER (LA) 20 MG PO CP24: 20.0000 mg | ORAL_CAPSULE | Freq: Two times a day (BID) | ORAL | 0 refills | Status: DC

## 2019-06-16 MED ORDER — LISINOPRIL 20 MG PO TABS: 20.0000 mg | ORAL_TABLET | Freq: Every day | ORAL | 3 refills | Status: DC

## 2019-06-16 MED ORDER — ATORVASTATIN CALCIUM 80 MG PO TABS: 40.0000 mg | ORAL_TABLET | Freq: Every day | ORAL | 3 refills | Status: DC

## 2019-06-16 MED ORDER — TAMSULOSIN HCL 0.4 MG PO CAPS: 0.4000 mg | ORAL_CAPSULE | Freq: Every day | ORAL | 3 refills | Status: DC

## 2019-06-16 MED ORDER — CLONAZEPAM 1 MG PO TABS: ORAL_TABLET | ORAL | 5 refills | Status: DC

## 2019-06-16 MED ORDER — FINASTERIDE 5 MG PO TABS: 5.0000 mg | ORAL_TABLET | Freq: Every day | ORAL | 3 refills | Status: DC

## 2019-06-16 NOTE — Progress Notes (Signed)
   Subjective:    Patient ID: Riley Hartman, male    DOB: 09-Feb-1961, 59 y.o.   MRN: [id number]  HPI Here for a well exam. He feels fine. He has been cutting the Atorvastatin pills in half because they cause his muscles to ache at the full dose.    Review of Systems  Constitutional: Negative.   HENT: Negative.   Eyes: Negative.   Respiratory: Negative.   Cardiovascular: Negative.   Gastrointestinal: Negative.   Genitourinary: Negative.   Musculoskeletal: Negative.   Skin: Negative.   Neurological: Negative.   Psychiatric/Behavioral: Negative.        Objective:   Physical Exam Constitutional:      General: He is not in acute distress.    Appearance: He is well-developed. He is not diaphoretic.  HENT:     Head: Normocephalic and atraumatic.     Right Ear: External ear normal.     Left Ear: External ear normal.     Nose: Nose normal.     Mouth/Throat:     Pharynx: No oropharyngeal exudate.  Eyes:     General: No scleral icterus.       Right eye: No discharge.        Left eye: No discharge.     Conjunctiva/sclera: Conjunctivae normal.     Pupils: Pupils are equal, round, and reactive to light.  Neck:     Thyroid: No thyromegaly.     Vascular: No JVD.     Trachea: No tracheal deviation.  Cardiovascular:     Rate and Rhythm: Normal rate and regular rhythm.     Heart sounds: Normal heart sounds. No murmur. No friction rub. No gallop.   Pulmonary:     Effort: Pulmonary effort is normal. No respiratory distress.     Breath sounds: Normal breath sounds. No wheezing or rales.  Chest:     Chest wall: No tenderness.  Abdominal:     General: Bowel sounds are normal. There is no distension.     Palpations: Abdomen is soft. There is no mass.     Tenderness: There is no abdominal tenderness. There is no guarding or rebound.  Genitourinary:    Penis: Normal. No tenderness.      Testes: Normal.     Prostate: Normal.     Rectum: Normal. Guaiac result negative.   Musculoskeletal:        General: No tenderness. Normal range of motion.     Cervical back: Neck supple.  Lymphadenopathy:     Cervical: No cervical adenopathy.  Skin:    General: Skin is warm and dry.     Coloration: Skin is not pale.     Findings: No erythema or rash.  Neurological:     Mental Status: He is alert and oriented to person, place, and time.     Cranial Nerves: No cranial nerve deficit.     Motor: No abnormal muscle tone.     Coordination: Coordination normal.     Deep Tendon Reflexes: Reflexes are normal and symmetric. Reflexes normal.  Psychiatric:        Behavior: Behavior normal.        Thought Content: Thought content normal.        Judgment: Judgment normal.           Assessment & Plan:  Well exam. We discussed diet and exercise. Get fasting labs. Set up another colonoscopy. Alysia Penna, MD

## 2019-07-04 ENCOUNTER — Other Ambulatory Visit: Payer: Self-pay | Admitting: Podiatry

## 2019-07-04 ENCOUNTER — Other Ambulatory Visit: Payer: Self-pay

## 2019-07-04 ENCOUNTER — Ambulatory Visit (INDEPENDENT_AMBULATORY_CARE_PROVIDER_SITE_OTHER): Payer: BC Managed Care – PPO

## 2019-07-04 ENCOUNTER — Encounter: Payer: Self-pay | Admitting: Podiatry

## 2019-07-04 ENCOUNTER — Ambulatory Visit: Payer: BC Managed Care – PPO | Admitting: Podiatry

## 2019-07-04 VITALS — BP 119/70 | HR 70 | Temp 97.5°F

## 2019-07-04 DIAGNOSIS — M79672 Pain in left foot: Secondary | ICD-10-CM

## 2019-07-04 DIAGNOSIS — M7752 Other enthesopathy of left foot: Secondary | ICD-10-CM

## 2019-07-04 DIAGNOSIS — M779 Enthesopathy, unspecified: Secondary | ICD-10-CM

## 2019-07-04 DIAGNOSIS — R2681 Unsteadiness on feet: Secondary | ICD-10-CM

## 2019-07-04 DIAGNOSIS — M722 Plantar fascial fibromatosis: Secondary | ICD-10-CM | POA: Diagnosis not present

## 2019-07-04 DIAGNOSIS — M79671 Pain in right foot: Secondary | ICD-10-CM

## 2019-07-04 NOTE — Progress Notes (Signed)
Subjective:   Patient ID: Riley Hartman, male   DOB: 59 y.o.   MRN: [id number]   HPI Patient presents stating the left foot has really been bothering me and its that same joint I had trouble with in the past and I still have some instability but it is been somewhat improved   ROS      Objective:  Physical Exam  Neurovascular status intact with inflammation pain around the third MPJ left with fluid buildup around the joint surface that is painful when palpated and mild balance instability     Assessment:  Capsulitis third MPJ left acute in nature with chronic balance issues     Plan:  H&P reviewed the gait stability and we will just keep an eye on this and today I did proximal nerve block left I aspirated the third MPJ getting out a small amount of clear fluid injected quarter cc dexamethasone Kenalog and applied padding along with rigid bottom shoes and patient will be seen back to recheck  X-rays were negative for signs of fracture or bone pathology associated with the pain he is experiencing

## 2019-07-13 ENCOUNTER — Ambulatory Visit (AMBULATORY_SURGERY_CENTER): Payer: Self-pay | Admitting: *Deleted

## 2019-07-13 ENCOUNTER — Other Ambulatory Visit: Payer: Self-pay

## 2019-07-13 VITALS — Temp 97.3°F | Ht 73.0 in | Wt 233.0 lb

## 2019-07-13 DIAGNOSIS — Z1211 Encounter for screening for malignant neoplasm of colon: Secondary | ICD-10-CM

## 2019-07-13 DIAGNOSIS — Z01818 Encounter for other preprocedural examination: Secondary | ICD-10-CM

## 2019-07-13 MED ORDER — NA SULFATE-K SULFATE-MG SULF 17.5-3.13-1.6 GM/177ML PO SOLN: 1.0000 | Freq: Once | ORAL | 0 refills | Status: AC

## 2019-07-13 NOTE — Progress Notes (Signed)

## 2019-07-18 DIAGNOSIS — H1132 Conjunctival hemorrhage, left eye: Secondary | ICD-10-CM | POA: Diagnosis not present

## 2019-07-21 ENCOUNTER — Ambulatory Visit (INDEPENDENT_AMBULATORY_CARE_PROVIDER_SITE_OTHER): Payer: BC Managed Care – PPO

## 2019-07-21 ENCOUNTER — Other Ambulatory Visit: Payer: Self-pay | Admitting: Gastroenterology

## 2019-07-21 ENCOUNTER — Other Ambulatory Visit: Payer: Self-pay

## 2019-07-21 DIAGNOSIS — Z1159 Encounter for screening for other viral diseases: Secondary | ICD-10-CM

## 2019-07-22 ENCOUNTER — Encounter: Payer: Self-pay | Admitting: Gastroenterology

## 2019-07-22 LAB — SARS CORONAVIRUS 2 (TAT 6-24 HRS): SARS Coronavirus 2: NEGATIVE

## 2019-07-26 ENCOUNTER — Encounter: Payer: Self-pay | Admitting: Gastroenterology

## 2019-07-26 ENCOUNTER — Ambulatory Visit (AMBULATORY_SURGERY_CENTER): Payer: BC Managed Care – PPO | Admitting: Gastroenterology

## 2019-07-26 ENCOUNTER — Other Ambulatory Visit: Payer: Self-pay

## 2019-07-26 VITALS — BP 128/78 | HR 57 | Temp 96.8°F | Resp 20 | Ht 73.0 in | Wt 233.0 lb

## 2019-07-26 DIAGNOSIS — K621 Rectal polyp: Secondary | ICD-10-CM | POA: Diagnosis not present

## 2019-07-26 DIAGNOSIS — D129 Benign neoplasm of anus and anal canal: Secondary | ICD-10-CM

## 2019-07-26 DIAGNOSIS — D122 Benign neoplasm of ascending colon: Secondary | ICD-10-CM | POA: Diagnosis not present

## 2019-07-26 DIAGNOSIS — Z1211 Encounter for screening for malignant neoplasm of colon: Secondary | ICD-10-CM | POA: Diagnosis not present

## 2019-07-26 DIAGNOSIS — D128 Benign neoplasm of rectum: Secondary | ICD-10-CM | POA: Diagnosis not present

## 2019-07-26 DIAGNOSIS — K635 Polyp of colon: Secondary | ICD-10-CM | POA: Diagnosis not present

## 2019-07-26 HISTORY — PX: COLONOSCOPY: SHX174

## 2019-07-26 MED ORDER — SODIUM CHLORIDE 0.9 % IV SOLN: 500.0000 mL | Freq: Once | INTRAVENOUS | Status: DC

## 2019-07-26 NOTE — Progress Notes (Signed)
Temp  LC  VS  DT  Pt's states no medical or surgical changes since previsit or office visit.    

## 2019-07-26 NOTE — Progress Notes (Signed)
Pt Drowsy. VSS. To PACU, report to RN. No anesthetic complications noted.  

## 2019-07-26 NOTE — Op Note (Signed)
[facility] Patient Name: Riley Hartman Procedure Date: 07/26/2019 9:31 AM MRN: [id number] Endoscopist: Milus Banister , MD Age: 59 Referring MD:  Date of Birth: January 09, 1961 Gender: Male Account #: [id number] Procedure:                Colonoscopy Indications:              Screening for colorectal malignant neoplasm Medicines:                Monitored Anesthesia Care Procedure:                Pre-Anesthesia Assessment:                           - Prior to the procedure, a History and Physical                            was performed, and patient medications and                            allergies were reviewed. The patient's tolerance of                            previous anesthesia was also reviewed. The risks                            and benefits of the procedure and the sedation                            options and risks were discussed with the patient.                            All questions were answered, and informed consent                            was obtained. Prior Anticoagulants: The patient has                            taken no previous anticoagulant or antiplatelet                            agents. ASA Grade Assessment: II - A patient with                            mild systemic disease. After reviewing the risks                            and benefits, the patient was deemed in                            satisfactory condition to undergo the procedure.                           After obtaining informed consent, the colonoscope  was passed under direct vision. Throughout the                            procedure, the patient's blood pressure, pulse, and                            oxygen saturations were monitored continuously. The                            Colonoscope was introduced through the anus and                            advanced to the the cecum, identified by                            appendiceal orifice and  ileocecal valve. The                            colonoscopy was performed without difficulty. The                            patient tolerated the procedure well. The quality                            of the bowel preparation was good. The ileocecal                            valve, appendiceal orifice, and rectum were                            photographed. Scope In: 9:48:19 AM Scope Out: 10:02:52 AM Scope Withdrawal Time: 0 hours 11 minutes 1 second  Total Procedure Duration: 0 hours 14 minutes 33 seconds  Findings:                 Two sessile polyps were found in the rectum and                            ascending colon. The polyps were 2 to 4 mm in size.                            These polyps were removed with a cold snare.                            Resection and retrieval were complete.                           Multiple small-mouthed diverticula were found in                            the left colon.                           External hemorrhoids were found. The hemorrhoids  were small.                           The exam was otherwise without abnormality on                            direct and retroflexion views. Complications:            No immediate complications. Estimated blood loss:                            None. Estimated Blood Loss:     Estimated blood loss: none. Impression:               - Two 2 to 4 mm polyps in the rectum and in the                            ascending colon, removed with a cold snare.                            Resected and retrieved.                           - Diverticulosis in the left colon.                           - External hemorrhoids.                           - The examination was otherwise normal on direct                            and retroflexion views. Recommendation:           - Patient has a contact number available for                            emergencies. The signs and symptoms of potential                             delayed complications were discussed with the                            patient. Return to normal activities tomorrow.                            Written discharge instructions were provided to the                            patient.                           - Resume previous diet.                           - Continue present medications.                           -  Await pathology results. Milus Banister, MD 07/26/2019 10:04:36 AM This report has been signed electronically.

## 2019-07-26 NOTE — Progress Notes (Signed)
Called to room to assist during endoscopic procedure.  Patient ID and intended procedure confirmed with present staff. Received instructions for my participation in the procedure from the performing physician.  

## 2019-07-26 NOTE — Patient Instructions (Signed)
Discharge instructions given. Handouts on polyps,diverticulosis and hemorrhoids. Resume previous medications. YOU HAD AN ENDOSCOPIC PROCEDURE TODAY AT [facility]:   Refer to the procedure report that was given to you for any specific questions about what was found during the examination.  If the procedure report does not answer your questions, please call your gastroenterologist to clarify.  If you requested that your care partner not be given the details of your procedure findings, then the procedure report has been included in a sealed envelope for you to review at your convenience later.  YOU SHOULD EXPECT: Some feelings of bloating in the abdomen. Passage of more gas than usual.  Walking can help get rid of the air that was put into your GI tract during the procedure and reduce the bloating. If you had a lower endoscopy (such as a colonoscopy or flexible sigmoidoscopy) you may notice spotting of blood in your stool or on the toilet paper. If you underwent a bowel prep for your procedure, you may not have a normal bowel movement for a few days.  Please Note:  You might notice some irritation and congestion in your nose or some drainage.  This is from the oxygen used during your procedure.  There is no need for concern and it should clear up in a day or so.  SYMPTOMS TO REPORT IMMEDIATELY:   Following lower endoscopy (colonoscopy or flexible sigmoidoscopy):  Excessive amounts of blood in the stool  Significant tenderness or worsening of abdominal pains  Swelling of the abdomen that is new, acute  Fever of 100F or higher  For urgent or emergent issues, a gastroenterologist can be reached at any hour by calling [phone]. Do not use MyChart messaging for urgent concerns.    DIET:  We do recommend a small meal at first, but then you may proceed to your regular diet.  Drink plenty of fluids but you should avoid alcoholic beverages for 24 hours.  ACTIVITY:  You should  plan to take it easy for the rest of today and you should NOT DRIVE or use heavy machinery until tomorrow (because of the sedation medicines used during the test).    FOLLOW UP: Our staff will call the number listed on your records 48-72 hours following your procedure to check on you and address any questions or concerns that you may have regarding the information given to you following your procedure. If we do not reach you, we will leave a message.  We will attempt to reach you two times.  During this call, we will ask if you have developed any symptoms of COVID 19. If you develop any symptoms (ie: fever, flu-like symptoms, shortness of breath, cough etc.) before then, please call [phone].  If you test positive for Covid 19 in the 2 weeks post procedure, please call and report this information to [place].    If any biopsies were taken you will be contacted by phone or by letter within the next 1-3 weeks.  Please call us at [phone] if you have not heard about the biopsies in 3 weeks.    SIGNATURES/CONFIDENTIALITY: You and/or your care partner have signed paperwork which will be entered into your electronic medical record.  These signatures attest to the fact that that the information above on your After Visit Summary has been reviewed and is understood.  Full responsibility of the confidentiality of this discharge information lies with you and/or your care-partner.

## 2019-07-28 ENCOUNTER — Telehealth: Payer: Self-pay

## 2019-07-28 NOTE — Telephone Encounter (Signed)
  Follow up Call-  Call back number 07/26/2019  Post procedure Call Back phone  # [phone]  Permission to leave phone message Yes  Some recent data might be hidden     Patient questions:  Do you have a fever, pain , or abdominal swelling? No. Pain Score  0 *  Have you tolerated food without any problems? Yes.    Have you been able to return to your normal activities? Yes.    Do you have any questions about your discharge instructions: Diet   No. Medications  No. Follow up visit  No.  Do you have questions or concerns about your Care? No.  Actions: * If pain score is 4 or above: No action needed, pain <4.  1. Have you developed a fever since your procedure? no  2.   Have you had an respiratory symptoms (SOB or cough) since your procedure? no  3.   Have you tested positive for COVID 19 since your procedure no  4.   Have you had any family members/close contacts diagnosed with the COVID 19 since your procedure?  no   If yes to any of these questions please route to Joylene John, RN and Erenest Rasher, RN

## 2019-07-29 ENCOUNTER — Encounter: Payer: Self-pay | Admitting: Gastroenterology

## 2019-07-29 ENCOUNTER — Other Ambulatory Visit: Payer: Self-pay | Admitting: Family Medicine

## 2019-08-10 ENCOUNTER — Other Ambulatory Visit: Payer: Self-pay | Admitting: Family Medicine

## 2019-09-26 ENCOUNTER — Other Ambulatory Visit: Payer: Self-pay | Admitting: Family Medicine

## 2019-09-26 NOTE — Telephone Encounter (Signed)
Last filled 08/16/2019 Last OV 06/16/2019  Ok to fill?

## 2019-09-27 MED ORDER — METHYLPHENIDATE HCL ER (LA) 20 MG PO CP24: 20.0000 mg | ORAL_CAPSULE | Freq: Two times a day (BID) | ORAL | 0 refills | Status: DC

## 2019-09-27 NOTE — Telephone Encounter (Signed)
Done

## 2019-12-27 ENCOUNTER — Other Ambulatory Visit: Payer: Self-pay

## 2019-12-27 ENCOUNTER — Encounter: Payer: Self-pay | Admitting: Family Medicine

## 2019-12-27 ENCOUNTER — Ambulatory Visit: Payer: BC Managed Care – PPO | Admitting: Family Medicine

## 2019-12-27 VITALS — BP 118/80 | HR 98 | Temp 98.0°F | Ht 72.5 in | Wt 224.4 lb

## 2019-12-27 DIAGNOSIS — M791 Myalgia, unspecified site: Secondary | ICD-10-CM

## 2019-12-27 MED ORDER — METHYLPHENIDATE HCL ER (LA) 20 MG PO CP24: 20.0000 mg | ORAL_CAPSULE | Freq: Two times a day (BID) | ORAL | 0 refills | Status: DC

## 2019-12-27 NOTE — Progress Notes (Signed)
   Subjective:    Patient ID: Riley Hartman, male    DOB: 25-Sep-1960, 59 y.o.   MRN: [id number]  HPI Here for several recent episodes of diffuse muscle cramps and generalized weakness. He recently started back on Lipitor for high cholesterol, but he has stopped taking this. He describes an incident last weekend when he was working out in his yard in the hot weather. He tried to drink plenty of fluids, but he became quite weak and began to cramp all over his body. He took 5 OTC potassium pills a friend had, and he quickly felt better. He has tried to stay hydrated since then. Today he feels fine.    Review of Systems  Constitutional: Negative.   Respiratory: Negative.   Cardiovascular: Negative.   Musculoskeletal: Positive for myalgias.  Neurological: Positive for weakness.       Objective:   Physical Exam Constitutional:      Appearance: Normal appearance.  Cardiovascular:     Rate and Rhythm: Normal rate and regular rhythm.     Pulses: Normal pulses.     Heart sounds: Normal heart sounds.  Pulmonary:     Effort: Pulmonary effort is normal.     Breath sounds: Normal breath sounds.  Neurological:     General: No focal deficit present.     Mental Status: He is alert and oriented to person, place, and time.           Assessment & Plan:  Muscle cramps and weakness, possible electrolyte disturbance. I agreed that he will stay off Lipitor. Get labs today to include a BMET and CK.  Alysia Penna, MD

## 2019-12-28 LAB — BASIC METABOLIC PANEL
BUN: 19 mg/dL (ref 7–25)
CO2: 28 mmol/L (ref 20–32)
Calcium: 9.8 mg/dL (ref 8.6–10.3)
Chloride: 103 mmol/L (ref 98–110)
Creat: 0.83 mg/dL (ref 0.70–1.25)
Glucose, Bld: 110 mg/dL — ABNORMAL HIGH (ref 65–99)
Potassium: 4 mmol/L (ref 3.5–5.3)
Sodium: 139 mmol/L (ref 135–146)

## 2019-12-28 LAB — MAGNESIUM: Magnesium: 2.1 mg/dL (ref 1.5–2.5)

## 2019-12-28 LAB — CK: Total CK: 100 U/L (ref 44–196)

## 2020-02-27 ENCOUNTER — Other Ambulatory Visit: Payer: Self-pay | Admitting: Family Medicine

## 2020-02-27 NOTE — Telephone Encounter (Signed)
Patient notified VIA phone that there is an RX at the pharmacy that he can fill tommorrow.  Dm/cma

## 2020-02-27 NOTE — Telephone Encounter (Signed)
Pt is calling in stating the he needs a refill on Rx methylphenidate (RITALIN LA) 20 MGPharm:  [facility] [address] [facility] [place]

## 2020-03-26 ENCOUNTER — Other Ambulatory Visit: Payer: Self-pay | Admitting: Family Medicine

## 2020-03-26 NOTE — Addendum Note (Signed)
Addended by: Rodrigo Ran on: 03/26/2020 03:21 PM   Modules accepted: Orders

## 2020-03-26 NOTE — Telephone Encounter (Signed)
  methylphenidate (RITALIN LA) 20 MG 24 hr capsule  [id number] - [place], [place] - [address] Phone:  [phone]  Fax:  [phone]

## 2020-03-28 MED ORDER — METHYLPHENIDATE HCL ER (LA) 20 MG PO CP24: 20.0000 mg | ORAL_CAPSULE | Freq: Two times a day (BID) | ORAL | 0 refills | Status: DC

## 2020-03-28 NOTE — Telephone Encounter (Signed)
Done

## 2020-03-28 NOTE — Addendum Note (Signed)
Addended by: Alysia Penna A on: 03/28/2020 12:11 PM   Modules accepted: Orders

## 2020-06-15 ENCOUNTER — Other Ambulatory Visit: Payer: Self-pay

## 2020-06-18 ENCOUNTER — Encounter: Payer: Self-pay | Admitting: Family Medicine

## 2020-06-18 ENCOUNTER — Other Ambulatory Visit: Payer: Self-pay

## 2020-06-18 ENCOUNTER — Ambulatory Visit (INDEPENDENT_AMBULATORY_CARE_PROVIDER_SITE_OTHER): Payer: BC Managed Care – PPO | Admitting: Family Medicine

## 2020-06-18 VITALS — BP 110/80 | HR 83 | Temp 98.0°F | Ht 72.0 in | Wt 214.0 lb

## 2020-06-18 DIAGNOSIS — Z0000 Encounter for general adult medical examination without abnormal findings: Secondary | ICD-10-CM | POA: Diagnosis not present

## 2020-06-18 DIAGNOSIS — R972 Elevated prostate specific antigen [PSA]: Secondary | ICD-10-CM | POA: Diagnosis not present

## 2020-06-18 DIAGNOSIS — Z23 Encounter for immunization: Secondary | ICD-10-CM | POA: Diagnosis not present

## 2020-06-18 DIAGNOSIS — Z125 Encounter for screening for malignant neoplasm of prostate: Secondary | ICD-10-CM

## 2020-06-18 LAB — BASIC METABOLIC PANEL
BUN: 18 mg/dL (ref 6–23)
CO2: 29 mEq/L (ref 19–32)
Calcium: 9.8 mg/dL (ref 8.4–10.5)
Chloride: 103 mEq/L (ref 96–112)
Creatinine, Ser: 0.95 mg/dL (ref 0.40–1.50)
GFR: 86.27 mL/min (ref >60.00)
Glucose, Bld: 100 mg/dL — ABNORMAL HIGH (ref 70–99)
Potassium: 4.4 mEq/L (ref 3.5–5.1)
Sodium: 140 mEq/L (ref 135–145)

## 2020-06-18 LAB — CBC WITH DIFFERENTIAL/PLATELET
Basophils Absolute: 0.1 10*3/uL (ref 0.0–0.1)
Basophils Relative: 0.6 % (ref 0.0–3.0)
Eosinophils Absolute: 0.3 10*3/uL (ref 0.0–0.7)
Eosinophils Relative: 3.4 % (ref 0.0–5.0)
HCT: 43.6 % (ref 39.0–52.0)
Hemoglobin: 14.6 g/dL (ref 13.0–17.0)
Lymphocytes Relative: 33.9 % (ref 12.0–46.0)
Lymphs Abs: 3.3 10*3/uL (ref 0.7–4.0)
MCHC: 33.4 g/dL (ref 30.0–36.0)
MCV: 86.5 fl (ref 78.0–100.0)
Monocytes Absolute: 0.7 10*3/uL (ref 0.1–1.0)
Monocytes Relative: 6.7 % (ref 3.0–12.0)
Neutro Abs: 5.4 10*3/uL (ref 1.4–7.7)
Neutrophils Relative %: 55.4 % (ref 43.0–77.0)
Platelets: 213 10*3/uL (ref 150.0–400.0)
RBC: 5.04 Mil/uL (ref 4.22–5.81)
RDW: 13.4 % (ref 11.5–15.5)
WBC: 9.8 10*3/uL (ref 4.0–10.5)

## 2020-06-18 LAB — HEPATIC FUNCTION PANEL
ALT: 17 U/L (ref 0–53)
AST: 15 U/L (ref 0–37)
Albumin: 4.5 g/dL (ref 3.5–5.2)
Alkaline Phosphatase: 58 U/L (ref 39–117)
Bilirubin, Direct: 0.1 mg/dL (ref 0.0–0.3)
Total Bilirubin: 0.4 mg/dL (ref 0.2–1.2)
Total Protein: 7.3 g/dL (ref 6.0–8.3)

## 2020-06-18 LAB — LIPID PANEL
Cholesterol: 207 mg/dL — ABNORMAL HIGH (ref 0–200)
HDL: 37.7 mg/dL — ABNORMAL LOW (ref >39.00)
LDL Cholesterol: 138 mg/dL — ABNORMAL HIGH (ref 0–99)
NonHDL: 169.77
Total CHOL/HDL Ratio: 6
Triglycerides: 158 mg/dL — ABNORMAL HIGH (ref 0.0–149.0)
VLDL: 31.6 mg/dL (ref 0.0–40.0)

## 2020-06-18 LAB — HEMOGLOBIN A1C: Hgb A1c MFr Bld: 6 % (ref 4.6–6.5)

## 2020-06-18 LAB — TSH: TSH: 2.08 u[IU]/mL (ref 0.35–4.50)

## 2020-06-18 LAB — PSA: PSA: 41.33 ng/mL — ABNORMAL HIGH (ref 0.10–4.00)

## 2020-06-18 MED ORDER — LISINOPRIL 20 MG PO TABS
20.0000 mg | ORAL_TABLET | Freq: Every day | ORAL | 3 refills | Status: DC
Start: 2020-06-18 — End: 2021-07-29

## 2020-06-18 MED ORDER — CLONAZEPAM 1 MG PO TABS
ORAL_TABLET | ORAL | 5 refills | Status: DC
Start: 2020-06-18 — End: 2021-06-19

## 2020-06-18 MED ORDER — METHYLPHENIDATE HCL ER (LA) 20 MG PO CP24: 20.0000 mg | ORAL_CAPSULE | Freq: Two times a day (BID) | ORAL | 0 refills | Status: DC

## 2020-06-18 MED ORDER — FINASTERIDE 5 MG PO TABS
5.0000 mg | ORAL_TABLET | Freq: Every day | ORAL | 3 refills | Status: DC
Start: 2020-06-18 — End: 2020-08-21

## 2020-06-18 MED ORDER — TAMSULOSIN HCL 0.4 MG PO CAPS
0.4000 mg | ORAL_CAPSULE | Freq: Every day | ORAL | 3 refills | Status: DC
Start: 2020-06-18 — End: 2021-07-26

## 2020-06-18 NOTE — Progress Notes (Signed)
Subjective:    Patient ID: Riley Hartman, male    DOB: [birth year], 60 y.o.   MRN: [id number]  HPI Here for a well exam. He is doing well although he recently underwent dental surgery to remove all this teeth.    Review of Systems  Constitutional: Negative.   HENT: Negative.   Eyes: Negative.   Respiratory: Negative.   Cardiovascular: Negative.   Gastrointestinal: Negative.   Genitourinary: Negative.   Musculoskeletal: Negative.   Skin: Negative.   Neurological: Negative.   Psychiatric/Behavioral: Negative.        Objective:   Physical Exam Constitutional:      General: He is not in acute distress.    Appearance: Normal appearance. He is well-developed and well-nourished. He is not diaphoretic.  HENT:     Head: Normocephalic and atraumatic.     Right Ear: External ear normal.     Left Ear: External ear normal.     Nose: Nose normal.     Mouth/Throat:     Mouth: Oropharynx is clear and moist.     Pharynx: No oropharyngeal exudate.  Eyes:     General: No scleral icterus.       Right eye: No discharge.        Left eye: No discharge.     Extraocular Movements: EOM normal.     Conjunctiva/sclera: Conjunctivae normal.     Pupils: Pupils are equal, round, and reactive to light.  Neck:     Thyroid: No thyromegaly.     Vascular: No JVD.     Trachea: No tracheal deviation.  Cardiovascular:     Rate and Rhythm: Normal rate and regular rhythm.     Pulses: Intact distal pulses.     Heart sounds: Normal heart sounds. No murmur heard. No friction rub. No gallop.   Pulmonary:     Effort: Pulmonary effort is normal. No respiratory distress.     Breath sounds: Normal breath sounds. No wheezing or rales.  Chest:     Chest wall: No tenderness.  Abdominal:     General: Bowel sounds are normal. There is no distension.     Palpations: Abdomen is soft. There is no mass.     Tenderness: There is no abdominal tenderness. There is no guarding or rebound.  Genitourinary:    Penis:  Normal. No tenderness.      Testes: Normal.     Prostate: Normal.     Rectum: Normal. Guaiac result negative.  Musculoskeletal:        General: No tenderness or edema. Normal range of motion.     Cervical back: Neck supple.  Lymphadenopathy:     Cervical: No cervical adenopathy.  Skin:    General: Skin is warm and dry.     Coloration: Skin is not pale.     Findings: No erythema or rash.  Neurological:     Mental Status: He is alert and oriented to person, place, and time.     Cranial Nerves: No cranial nerve deficit.     Motor: No abnormal muscle tone.     Coordination: Coordination normal.     Deep Tendon Reflexes: Reflexes are normal and symmetric. Reflexes normal.  Psychiatric:        Mood and Affect: Mood and affect normal.        Behavior: Behavior normal.        Thought Content: Thought content normal.        Judgment: Judgment normal.  Assessment & Plan:  Well exam. We discussed diet and exercise. Get fasting labs. Alysia Penna, MD

## 2020-06-18 NOTE — Addendum Note (Signed)
Addended by: Wyvonne Lenz on: 06/18/2020 10:02 AM   Modules accepted: Orders

## 2020-06-18 NOTE — Addendum Note (Signed)
Addended by: Janann Colonel on: 06/18/2020 09:46 AM   Modules accepted: Orders

## 2020-06-20 NOTE — Addendum Note (Signed)
Addended by: Alysia Penna A on: 06/20/2020 05:04 PM   Modules accepted: Orders

## 2020-06-22 ENCOUNTER — Other Ambulatory Visit: Payer: Self-pay | Admitting: Urology

## 2020-06-22 DIAGNOSIS — R972 Elevated prostate specific antigen [PSA]: Secondary | ICD-10-CM

## 2020-06-22 DIAGNOSIS — R3912 Poor urinary stream: Secondary | ICD-10-CM | POA: Diagnosis not present

## 2020-06-22 DIAGNOSIS — N401 Enlarged prostate with lower urinary tract symptoms: Secondary | ICD-10-CM | POA: Diagnosis not present

## 2020-06-22 DIAGNOSIS — N403 Nodular prostate with lower urinary tract symptoms: Secondary | ICD-10-CM | POA: Diagnosis not present

## 2020-07-13 ENCOUNTER — Ambulatory Visit
Admission: RE | Admit: 2020-07-13 | Discharge: 2020-07-13 | Disposition: A | Discharge status: Home / Self Care | Payer: BC Managed Care – PPO | Source: Ambulatory Visit | Attending: Urology | Admitting: Urology

## 2020-07-13 ENCOUNTER — Other Ambulatory Visit: Payer: Self-pay

## 2020-07-13 DIAGNOSIS — R972 Elevated prostate specific antigen [PSA]: Secondary | ICD-10-CM

## 2020-07-13 MED ORDER — DIPHENHYDRAMINE HCL 50 MG PO CAPS: 50.0000 mg | ORAL_CAPSULE | Freq: Once | ORAL | Status: AC

## 2020-07-13 MED ORDER — PREDNISONE 50 MG PO TABS: ORAL_TABLET | ORAL | 0 refills | Status: DC

## 2020-07-14 ENCOUNTER — Inpatient Hospital Stay: Admission: RE | Admit: 2020-07-14 | Payer: BC Managed Care – PPO | Source: Ambulatory Visit

## 2020-07-16 ENCOUNTER — Telehealth: Payer: Self-pay

## 2020-07-16 NOTE — Telephone Encounter (Signed)
13 hr prep was called in for pt on Friday 07/13/20. Received call from pt today stating the pharmacy did not receive the prescription. I contacted his pharmacy and resent a prescription. Pt verbalized understanding of his instructions.   Prescription: 07/18/20 1230 am- 50mg  Prednisone 07/18/20 630 am- 50mg  Prednisone 07/18/20 1230 pm  - 50mg  Prednisone and 50mg  Benadryl

## 2020-07-18 ENCOUNTER — Ambulatory Visit
Admission: RE | Admit: 2020-07-18 | Discharge: 2020-07-18 | Disposition: A | Discharge status: Home / Self Care | Payer: BC Managed Care – PPO | Source: Ambulatory Visit | Attending: Urology | Admitting: Urology

## 2020-07-18 ENCOUNTER — Other Ambulatory Visit: Payer: Self-pay

## 2020-07-18 DIAGNOSIS — N4289 Other specified disorders of prostate: Secondary | ICD-10-CM | POA: Diagnosis not present

## 2020-07-18 DIAGNOSIS — R972 Elevated prostate specific antigen [PSA]: Secondary | ICD-10-CM

## 2020-07-18 MED ORDER — GADOBENATE DIMEGLUMINE 529 MG/ML IV SOLN
15.0000 mL | Freq: Once | INTRAVENOUS | Status: AC | PRN
  Administered 2020-07-18: 15 mL via INTRAVENOUS

## 2020-07-26 DIAGNOSIS — R972 Elevated prostate specific antigen [PSA]: Secondary | ICD-10-CM | POA: Diagnosis not present

## 2020-07-26 DIAGNOSIS — C61 Malignant neoplasm of prostate: Secondary | ICD-10-CM | POA: Diagnosis not present

## 2020-08-01 DIAGNOSIS — C61 Malignant neoplasm of prostate: Secondary | ICD-10-CM | POA: Diagnosis not present

## 2020-08-01 DIAGNOSIS — C775 Secondary and unspecified malignant neoplasm of intrapelvic lymph nodes: Secondary | ICD-10-CM | POA: Diagnosis not present

## 2020-08-14 DIAGNOSIS — C61 Malignant neoplasm of prostate: Secondary | ICD-10-CM | POA: Diagnosis not present

## 2020-08-15 DIAGNOSIS — F17210 Nicotine dependence, cigarettes, uncomplicated: Secondary | ICD-10-CM | POA: Diagnosis not present

## 2020-08-15 DIAGNOSIS — C61 Malignant neoplasm of prostate: Secondary | ICD-10-CM | POA: Diagnosis not present

## 2020-08-15 DIAGNOSIS — Z716 Tobacco abuse counseling: Secondary | ICD-10-CM | POA: Diagnosis not present

## 2020-08-15 DIAGNOSIS — Z79899 Other long term (current) drug therapy: Secondary | ICD-10-CM | POA: Diagnosis not present

## 2020-08-15 DIAGNOSIS — Z8673 Personal history of transient ischemic attack (TIA), and cerebral infarction without residual deficits: Secondary | ICD-10-CM | POA: Diagnosis not present

## 2020-08-15 DIAGNOSIS — I10 Essential (primary) hypertension: Secondary | ICD-10-CM | POA: Diagnosis not present

## 2020-08-16 ENCOUNTER — Other Ambulatory Visit: Payer: Self-pay | Admitting: Family Medicine

## 2020-08-20 DIAGNOSIS — C61 Malignant neoplasm of prostate: Secondary | ICD-10-CM | POA: Diagnosis not present

## 2020-08-20 DIAGNOSIS — R590 Localized enlarged lymph nodes: Secondary | ICD-10-CM | POA: Diagnosis not present

## 2020-08-22 DIAGNOSIS — Z5111 Encounter for antineoplastic chemotherapy: Secondary | ICD-10-CM | POA: Diagnosis not present

## 2020-08-22 DIAGNOSIS — C61 Malignant neoplasm of prostate: Secondary | ICD-10-CM | POA: Diagnosis not present

## 2020-08-23 ENCOUNTER — Other Ambulatory Visit: Payer: Self-pay | Admitting: Family Medicine

## 2020-09-03 ENCOUNTER — Encounter: Payer: Self-pay | Admitting: Family Medicine

## 2020-09-03 ENCOUNTER — Telehealth: Payer: BC Managed Care – PPO | Admitting: Family Medicine

## 2020-09-03 DIAGNOSIS — R6889 Other general symptoms and signs: Secondary | ICD-10-CM

## 2020-09-03 DIAGNOSIS — Z20828 Contact with and (suspected) exposure to other viral communicable diseases: Secondary | ICD-10-CM

## 2020-09-03 MED ORDER — OSELTAMIVIR PHOSPHATE 75 MG PO CAPS: 75.0000 mg | ORAL_CAPSULE | Freq: Two times a day (BID) | ORAL | 0 refills | Status: AC
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
## 2020-09-06 ENCOUNTER — Encounter: Payer: Self-pay | Admitting: Family Medicine

## 2020-09-11 NOTE — Telephone Encounter (Signed)
Does he still have the same symptoms today. Or is he better?

## 2020-09-12 ENCOUNTER — Other Ambulatory Visit: Payer: Self-pay | Admitting: Family Medicine

## 2020-09-12 DIAGNOSIS — Z79899 Other long term (current) drug therapy: Secondary | ICD-10-CM | POA: Diagnosis not present

## 2020-09-12 DIAGNOSIS — Z8673 Personal history of transient ischemic attack (TIA), and cerebral infarction without residual deficits: Secondary | ICD-10-CM | POA: Diagnosis not present

## 2020-09-12 DIAGNOSIS — C61 Malignant neoplasm of prostate: Secondary | ICD-10-CM | POA: Diagnosis not present

## 2020-09-12 DIAGNOSIS — I10 Essential (primary) hypertension: Secondary | ICD-10-CM | POA: Diagnosis not present

## 2020-09-12 DIAGNOSIS — F17210 Nicotine dependence, cigarettes, uncomplicated: Secondary | ICD-10-CM | POA: Diagnosis not present

## 2020-09-13 NOTE — Telephone Encounter (Signed)
Noted  

## 2020-09-17 DIAGNOSIS — C61 Malignant neoplasm of prostate: Secondary | ICD-10-CM | POA: Diagnosis not present

## 2020-09-28 ENCOUNTER — Telehealth: Payer: Self-pay | Admitting: Family Medicine

## 2020-09-28 MED ORDER — METHYLPHENIDATE HCL ER (LA) 20 MG PO CP24: 20.0000 mg | ORAL_CAPSULE | Freq: Two times a day (BID) | ORAL | 0 refills | Status: DC

## 2020-09-28 NOTE — Telephone Encounter (Signed)
Last video visit- 09/03/20 Last refill- 08/28/20--60 capsules no refills  No future visit scheduled

## 2020-09-28 NOTE — Telephone Encounter (Signed)
Done

## 2020-09-28 NOTE — Addendum Note (Signed)
Addended by: Alysia Penna A on: 09/28/2020 04:56 PM   Modules accepted: Orders

## 2020-09-28 NOTE — Telephone Encounter (Signed)
methylphenidate (RITALIN LA) 20 MG 24 hr capsule    [id number] - [place], [place] - [address] Phone:  [phone]  Fax:  [phone]

## 2020-10-31 ENCOUNTER — Ambulatory Visit: Payer: BC Managed Care – PPO | Admitting: Family Medicine

## 2020-10-31 ENCOUNTER — Other Ambulatory Visit: Payer: Self-pay

## 2020-10-31 ENCOUNTER — Encounter: Payer: Self-pay | Admitting: Family Medicine

## 2020-10-31 VITALS — BP 130/74 | HR 77 | Temp 98.3°F | Ht 72.0 in | Wt 230.2 lb

## 2020-10-31 DIAGNOSIS — C61 Malignant neoplasm of prostate: Secondary | ICD-10-CM

## 2020-10-31 DIAGNOSIS — I10 Essential (primary) hypertension: Secondary | ICD-10-CM

## 2020-10-31 MED ORDER — HYDROCHLOROTHIAZIDE 25 MG PO TABS
25.0000 mg | ORAL_TABLET | Freq: Every day | ORAL | 3 refills | Status: DC
Start: 2020-10-31 — End: 2021-01-28

## 2020-10-31 MED ORDER — ABIRATERONE ACETATE 250 MG PO TABS: 1000.0000 mg | ORAL_TABLET | Freq: Every day | ORAL | 0 refills | Status: DC

## 2020-10-31 NOTE — Progress Notes (Signed)
   Subjective:    Patient ID: Riley Hartman, male    DOB: 1961-01-05, 60 y.o.   MRN: [id number]  HPI Here for one week of elevated BP, often in the 150s over 80s. It has been as high as 164/100 at home. During these times he feels lightheaded, but there is no chest pian or SOB. He is seeing Dr. Heide Guile at [facility] for prostate cancer. He was recently started on Zytiga for this, and he was told that elevations in BP are a common side effect of this.    Review of Systems  Constitutional: Negative.   Respiratory: Negative.    Cardiovascular: Negative.   Neurological:  Positive for light-headedness. Negative for headaches.      Objective:   Physical Exam Constitutional:      Appearance: Normal appearance.  Cardiovascular:     Rate and Rhythm: Normal rate and regular rhythm.     Pulses: Normal pulses.     Heart sounds: Normal heart sounds.  Pulmonary:     Effort: Pulmonary effort is normal.     Breath sounds: Normal breath sounds.  Neurological:     General: No focal deficit present.     Mental Status: He is alert and oriented to person, place, and time.          Assessment & Plan:  He will continue on Zytiga for the prostate cancer. We agreed to adjust his BP medications in the meantime. We will add HCTZ 25 mg daily to the Lisinopril. Report back in 3 weeks.  Alysia Penna, MD

## 2020-11-07 DIAGNOSIS — C61 Malignant neoplasm of prostate: Secondary | ICD-10-CM | POA: Diagnosis not present

## 2020-11-07 DIAGNOSIS — I10 Essential (primary) hypertension: Secondary | ICD-10-CM | POA: Diagnosis not present

## 2020-11-07 DIAGNOSIS — F17210 Nicotine dependence, cigarettes, uncomplicated: Secondary | ICD-10-CM | POA: Diagnosis not present

## 2020-11-07 DIAGNOSIS — Z8673 Personal history of transient ischemic attack (TIA), and cerebral infarction without residual deficits: Secondary | ICD-10-CM | POA: Diagnosis not present

## 2020-11-07 DIAGNOSIS — Z79899 Other long term (current) drug therapy: Secondary | ICD-10-CM | POA: Diagnosis not present

## 2020-11-07 DIAGNOSIS — Z7952 Long term (current) use of systemic steroids: Secondary | ICD-10-CM | POA: Diagnosis not present

## 2021-01-01 ENCOUNTER — Telehealth: Payer: Self-pay | Admitting: Family Medicine

## 2021-01-01 MED ORDER — METHYLPHENIDATE HCL ER (LA) 20 MG PO CP24: 20.0000 mg | ORAL_CAPSULE | Freq: Two times a day (BID) | ORAL | 0 refills | Status: DC

## 2021-01-01 NOTE — Telephone Encounter (Signed)
Spoke with patient to make aware [facility]

## 2021-01-01 NOTE — Telephone Encounter (Signed)
Last office visit- 10/31/20 Last refill- 11/28/20- 60 capsules, 0 refills  No future appointment scheduled.  [facility] [place]

## 2021-01-01 NOTE — Telephone Encounter (Signed)
Done

## 2021-01-01 NOTE — Addendum Note (Signed)
Addended by: Alysia Penna A on: 01/01/2021 12:52 PM   Modules accepted: Orders

## 2021-01-01 NOTE — Telephone Encounter (Signed)
PT needs a refill of their methylphenidate (RITALIN LA) 20 MG 24 hr capsule called into the pharmacy on file.

## 2021-01-02 DIAGNOSIS — C61 Malignant neoplasm of prostate: Secondary | ICD-10-CM | POA: Diagnosis not present

## 2021-01-11 DIAGNOSIS — C61 Malignant neoplasm of prostate: Secondary | ICD-10-CM | POA: Diagnosis not present

## 2021-01-16 DIAGNOSIS — C61 Malignant neoplasm of prostate: Secondary | ICD-10-CM | POA: Diagnosis not present

## 2021-01-17 DIAGNOSIS — Z510 Encounter for antineoplastic radiation therapy: Secondary | ICD-10-CM | POA: Diagnosis not present

## 2021-01-17 DIAGNOSIS — C61 Malignant neoplasm of prostate: Secondary | ICD-10-CM | POA: Diagnosis not present

## 2021-01-23 DIAGNOSIS — C61 Malignant neoplasm of prostate: Secondary | ICD-10-CM | POA: Diagnosis not present

## 2021-01-23 DIAGNOSIS — Z510 Encounter for antineoplastic radiation therapy: Secondary | ICD-10-CM | POA: Diagnosis not present

## 2021-01-24 DIAGNOSIS — Z510 Encounter for antineoplastic radiation therapy: Secondary | ICD-10-CM | POA: Diagnosis not present

## 2021-01-24 DIAGNOSIS — C61 Malignant neoplasm of prostate: Secondary | ICD-10-CM | POA: Diagnosis not present

## 2021-01-25 DIAGNOSIS — C61 Malignant neoplasm of prostate: Secondary | ICD-10-CM | POA: Diagnosis not present

## 2021-01-25 DIAGNOSIS — Z510 Encounter for antineoplastic radiation therapy: Secondary | ICD-10-CM | POA: Diagnosis not present

## 2021-01-28 ENCOUNTER — Other Ambulatory Visit: Payer: Self-pay | Admitting: Family Medicine

## 2021-01-28 DIAGNOSIS — Z510 Encounter for antineoplastic radiation therapy: Secondary | ICD-10-CM | POA: Diagnosis not present

## 2021-01-28 DIAGNOSIS — C61 Malignant neoplasm of prostate: Secondary | ICD-10-CM | POA: Diagnosis not present

## 2021-01-29 DIAGNOSIS — Z510 Encounter for antineoplastic radiation therapy: Secondary | ICD-10-CM | POA: Diagnosis not present

## 2021-01-29 DIAGNOSIS — C61 Malignant neoplasm of prostate: Secondary | ICD-10-CM | POA: Diagnosis not present

## 2021-01-30 DIAGNOSIS — C61 Malignant neoplasm of prostate: Secondary | ICD-10-CM | POA: Diagnosis not present

## 2021-01-30 DIAGNOSIS — Z510 Encounter for antineoplastic radiation therapy: Secondary | ICD-10-CM | POA: Diagnosis not present

## 2021-01-31 DIAGNOSIS — C61 Malignant neoplasm of prostate: Secondary | ICD-10-CM | POA: Diagnosis not present

## 2021-01-31 DIAGNOSIS — Z510 Encounter for antineoplastic radiation therapy: Secondary | ICD-10-CM | POA: Diagnosis not present

## 2021-02-01 DIAGNOSIS — C61 Malignant neoplasm of prostate: Secondary | ICD-10-CM | POA: Diagnosis not present

## 2021-02-01 DIAGNOSIS — Z510 Encounter for antineoplastic radiation therapy: Secondary | ICD-10-CM | POA: Diagnosis not present

## 2021-02-04 DIAGNOSIS — C61 Malignant neoplasm of prostate: Secondary | ICD-10-CM | POA: Diagnosis not present

## 2021-02-04 DIAGNOSIS — Z510 Encounter for antineoplastic radiation therapy: Secondary | ICD-10-CM | POA: Diagnosis not present

## 2021-02-05 DIAGNOSIS — C61 Malignant neoplasm of prostate: Secondary | ICD-10-CM | POA: Diagnosis not present

## 2021-02-05 DIAGNOSIS — Z510 Encounter for antineoplastic radiation therapy: Secondary | ICD-10-CM | POA: Diagnosis not present

## 2021-02-06 DIAGNOSIS — R5383 Other fatigue: Secondary | ICD-10-CM | POA: Diagnosis not present

## 2021-02-06 DIAGNOSIS — Z510 Encounter for antineoplastic radiation therapy: Secondary | ICD-10-CM | POA: Diagnosis not present

## 2021-02-06 DIAGNOSIS — F17210 Nicotine dependence, cigarettes, uncomplicated: Secondary | ICD-10-CM | POA: Diagnosis not present

## 2021-02-06 DIAGNOSIS — I10 Essential (primary) hypertension: Secondary | ICD-10-CM | POA: Diagnosis not present

## 2021-02-06 DIAGNOSIS — Z8673 Personal history of transient ischemic attack (TIA), and cerebral infarction without residual deficits: Secondary | ICD-10-CM | POA: Diagnosis not present

## 2021-02-06 DIAGNOSIS — C61 Malignant neoplasm of prostate: Secondary | ICD-10-CM | POA: Diagnosis not present

## 2021-02-06 DIAGNOSIS — Z7952 Long term (current) use of systemic steroids: Secondary | ICD-10-CM | POA: Diagnosis not present

## 2021-02-07 DIAGNOSIS — C61 Malignant neoplasm of prostate: Secondary | ICD-10-CM | POA: Diagnosis not present

## 2021-02-07 DIAGNOSIS — Z510 Encounter for antineoplastic radiation therapy: Secondary | ICD-10-CM | POA: Diagnosis not present

## 2021-02-08 DIAGNOSIS — C61 Malignant neoplasm of prostate: Secondary | ICD-10-CM | POA: Diagnosis not present

## 2021-02-08 DIAGNOSIS — Z510 Encounter for antineoplastic radiation therapy: Secondary | ICD-10-CM | POA: Diagnosis not present

## 2021-02-11 DIAGNOSIS — C61 Malignant neoplasm of prostate: Secondary | ICD-10-CM | POA: Diagnosis not present

## 2021-02-11 DIAGNOSIS — Z510 Encounter for antineoplastic radiation therapy: Secondary | ICD-10-CM | POA: Diagnosis not present

## 2021-02-12 DIAGNOSIS — C61 Malignant neoplasm of prostate: Secondary | ICD-10-CM | POA: Diagnosis not present

## 2021-02-12 DIAGNOSIS — Z510 Encounter for antineoplastic radiation therapy: Secondary | ICD-10-CM | POA: Diagnosis not present

## 2021-02-13 DIAGNOSIS — C61 Malignant neoplasm of prostate: Secondary | ICD-10-CM | POA: Diagnosis not present

## 2021-02-13 DIAGNOSIS — Z510 Encounter for antineoplastic radiation therapy: Secondary | ICD-10-CM | POA: Diagnosis not present

## 2021-02-14 DIAGNOSIS — Z510 Encounter for antineoplastic radiation therapy: Secondary | ICD-10-CM | POA: Diagnosis not present

## 2021-02-14 DIAGNOSIS — C61 Malignant neoplasm of prostate: Secondary | ICD-10-CM | POA: Diagnosis not present

## 2021-02-15 DIAGNOSIS — C61 Malignant neoplasm of prostate: Secondary | ICD-10-CM | POA: Diagnosis not present

## 2021-02-15 DIAGNOSIS — Z510 Encounter for antineoplastic radiation therapy: Secondary | ICD-10-CM | POA: Diagnosis not present

## 2021-02-18 DIAGNOSIS — Z510 Encounter for antineoplastic radiation therapy: Secondary | ICD-10-CM | POA: Diagnosis not present

## 2021-02-18 DIAGNOSIS — C61 Malignant neoplasm of prostate: Secondary | ICD-10-CM | POA: Diagnosis not present

## 2021-02-19 DIAGNOSIS — C61 Malignant neoplasm of prostate: Secondary | ICD-10-CM | POA: Diagnosis not present

## 2021-02-19 DIAGNOSIS — Z510 Encounter for antineoplastic radiation therapy: Secondary | ICD-10-CM | POA: Diagnosis not present

## 2021-02-20 DIAGNOSIS — Z510 Encounter for antineoplastic radiation therapy: Secondary | ICD-10-CM | POA: Diagnosis not present

## 2021-02-20 DIAGNOSIS — C61 Malignant neoplasm of prostate: Secondary | ICD-10-CM | POA: Diagnosis not present

## 2021-02-21 DIAGNOSIS — C61 Malignant neoplasm of prostate: Secondary | ICD-10-CM | POA: Diagnosis not present

## 2021-02-21 DIAGNOSIS — Z510 Encounter for antineoplastic radiation therapy: Secondary | ICD-10-CM | POA: Diagnosis not present

## 2021-02-22 DIAGNOSIS — Z510 Encounter for antineoplastic radiation therapy: Secondary | ICD-10-CM | POA: Diagnosis not present

## 2021-02-22 DIAGNOSIS — C61 Malignant neoplasm of prostate: Secondary | ICD-10-CM | POA: Diagnosis not present

## 2021-02-25 DIAGNOSIS — C61 Malignant neoplasm of prostate: Secondary | ICD-10-CM | POA: Diagnosis not present

## 2021-02-25 DIAGNOSIS — Z510 Encounter for antineoplastic radiation therapy: Secondary | ICD-10-CM | POA: Diagnosis not present

## 2021-02-26 DIAGNOSIS — C61 Malignant neoplasm of prostate: Secondary | ICD-10-CM | POA: Diagnosis not present

## 2021-02-26 DIAGNOSIS — Z510 Encounter for antineoplastic radiation therapy: Secondary | ICD-10-CM | POA: Diagnosis not present

## 2021-02-27 DIAGNOSIS — Z510 Encounter for antineoplastic radiation therapy: Secondary | ICD-10-CM | POA: Diagnosis not present

## 2021-02-27 DIAGNOSIS — C61 Malignant neoplasm of prostate: Secondary | ICD-10-CM | POA: Diagnosis not present

## 2021-02-28 DIAGNOSIS — Z510 Encounter for antineoplastic radiation therapy: Secondary | ICD-10-CM | POA: Diagnosis not present

## 2021-02-28 DIAGNOSIS — C61 Malignant neoplasm of prostate: Secondary | ICD-10-CM | POA: Diagnosis not present

## 2021-03-01 DIAGNOSIS — Z510 Encounter for antineoplastic radiation therapy: Secondary | ICD-10-CM | POA: Diagnosis not present

## 2021-03-01 DIAGNOSIS — C61 Malignant neoplasm of prostate: Secondary | ICD-10-CM | POA: Diagnosis not present

## 2021-03-06 DIAGNOSIS — K625 Hemorrhage of anus and rectum: Secondary | ICD-10-CM | POA: Diagnosis not present

## 2021-03-10 DIAGNOSIS — M542 Cervicalgia: Secondary | ICD-10-CM | POA: Diagnosis not present

## 2021-03-11 ENCOUNTER — Emergency Department (HOSPITAL_COMMUNITY)
Admission: EM | Admit: 2021-03-11 | Discharge: 2021-03-11 | Disposition: A | Discharge status: Home / Self Care | Payer: Worker's Compensation | Attending: Emergency Medicine | Admitting: Emergency Medicine

## 2021-03-11 ENCOUNTER — Other Ambulatory Visit: Payer: Self-pay

## 2021-03-11 ENCOUNTER — Emergency Department (HOSPITAL_COMMUNITY): Payer: Worker's Compensation

## 2021-03-11 ENCOUNTER — Encounter (HOSPITAL_COMMUNITY): Payer: Self-pay

## 2021-03-11 DIAGNOSIS — X509XXA Other and unspecified overexertion or strenuous movements or postures, initial encounter: Secondary | ICD-10-CM | POA: Diagnosis not present

## 2021-03-11 DIAGNOSIS — F17210 Nicotine dependence, cigarettes, uncomplicated: Secondary | ICD-10-CM | POA: Insufficient documentation

## 2021-03-11 DIAGNOSIS — R0902 Hypoxemia: Secondary | ICD-10-CM | POA: Diagnosis not present

## 2021-03-11 DIAGNOSIS — I10 Essential (primary) hypertension: Secondary | ICD-10-CM | POA: Diagnosis not present

## 2021-03-11 DIAGNOSIS — Z8546 Personal history of malignant neoplasm of prostate: Secondary | ICD-10-CM | POA: Diagnosis not present

## 2021-03-11 DIAGNOSIS — M542 Cervicalgia: Secondary | ICD-10-CM | POA: Diagnosis not present

## 2021-03-11 DIAGNOSIS — Z79899 Other long term (current) drug therapy: Secondary | ICD-10-CM | POA: Insufficient documentation

## 2021-03-11 DIAGNOSIS — Z8616 Personal history of COVID-19: Secondary | ICD-10-CM | POA: Diagnosis not present

## 2021-03-11 DIAGNOSIS — Z7982 Long term (current) use of aspirin: Secondary | ICD-10-CM | POA: Diagnosis not present

## 2021-03-11 DIAGNOSIS — I2510 Atherosclerotic heart disease of native coronary artery without angina pectoris: Secondary | ICD-10-CM | POA: Insufficient documentation

## 2021-03-11 MED ORDER — OXYCODONE-ACETAMINOPHEN 5-325 MG PO TABS: 1.0000 | ORAL_TABLET | Freq: Three times a day (TID) | ORAL | 0 refills | Status: DC | PRN

## 2021-03-11 MED ORDER — OXYCODONE-ACETAMINOPHEN 5-325 MG PO TABS
1.0000 | ORAL_TABLET | Freq: Once | ORAL | Status: AC
  Administered 2021-03-11: 22:00:00 1 via ORAL
  Filled 2021-03-11: qty 1

## 2021-03-11 NOTE — ED Triage Notes (Addendum)
Per EMS- Patient had a fall several weeks ago and went to an UC where he received muscle relaxants and states no relief. Patient continues to c/o neck pain.  Patient added in triage that he has left lateral neck pain that radiates across the posterior neck and down the right arm. Patient states he has intermittent tingling of the right hand
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
## 2021-03-11 NOTE — ED Notes (Signed)
C-collar applied per PA request.

## 2021-03-11 NOTE — Discharge Instructions (Addendum)
I have prescribed you a strong narcotic called percocet. Please only take this as prescribed. This medication also has tylenol in it, so please be sure you are not taking more than 3000 mg of tylenol per day. Do not drive or operate heavy machinery after taking this medication. Do not mix it with alcohol.   Below is the contact information for Dr. Sammuel Hines.  He is a local orthopedic specialist.  Please give him a call and schedule a follow-up appointment.  If you develop any new or worsening symptoms please come back to the emergency department.  It was a pleasure to meet you.

## 2021-03-11 NOTE — ED Provider Notes (Signed)
Emergency Medicine Provider Triage Evaluation Note  Riley Hartman , a 60 y.o. male  was evaluated in triage.  Pt complains of neck pain and radiculopathy since friday. Fall couple weeks ago without head injury. Seen at [facility] yesterday and was given flexiril. He has taken 2 doses without relief.   Review of Systems  Positive: Cervical radiculopathy and pain Negative: Fever  Physical Exam  BP (!) 163/108   Pulse 87   Temp 98.6 F (37 C) (Oral)   Resp 18   Ht 6' (1.829 m)   Wt 86.6 kg   SpO2 99%   BMI 25.90 kg/m  Gen:   Awake, no distress   Resp:  Normal effort  MSK:   Limited cervical motion. Cervical tenderness. Without thoracic spine tenderness. Other:    Medical Decision Making  Medically screening exam initiated at 7:11 PM.  Appropriate orders placed.  Ignatz Deis Hammer was informed that the remainder of the evaluation will be completed by another provider, this initial triage assessment does not replace that evaluation, and the importance of remaining in the ED until their evaluation is complete.     Evlyn Courier, PA-C 03/11/21 1913    Wyvonnia Dusky, MD 03/11/21 [phone]

## 2021-04-05 ENCOUNTER — Telehealth: Payer: Self-pay | Admitting: Family Medicine

## 2021-04-05 MED ORDER — METHYLPHENIDATE HCL ER (LA) 20 MG PO CP24: 20.0000 mg | ORAL_CAPSULE | Freq: Two times a day (BID) | ORAL | 0 refills | Status: DC

## 2021-04-05 NOTE — Telephone Encounter (Signed)
Patient called in to request a refill for methylphenidate (RITALIN LA) 20 MG 24 hr capsule [[id number]]  ENDED to be sent to his pharmacy.  Patient could be contacted at [phone].  Please advise.

## 2021-04-05 NOTE — Telephone Encounter (Signed)
Done

## 2021-04-09 NOTE — Telephone Encounter (Signed)
Message complete

## 2021-05-15 DIAGNOSIS — I10 Essential (primary) hypertension: Secondary | ICD-10-CM | POA: Diagnosis not present

## 2021-05-15 DIAGNOSIS — Z7952 Long term (current) use of systemic steroids: Secondary | ICD-10-CM | POA: Diagnosis not present

## 2021-05-15 DIAGNOSIS — Z79899 Other long term (current) drug therapy: Secondary | ICD-10-CM | POA: Diagnosis not present

## 2021-05-15 DIAGNOSIS — Z8673 Personal history of transient ischemic attack (TIA), and cerebral infarction without residual deficits: Secondary | ICD-10-CM | POA: Diagnosis not present

## 2021-05-15 DIAGNOSIS — R591 Generalized enlarged lymph nodes: Secondary | ICD-10-CM | POA: Diagnosis not present

## 2021-05-15 DIAGNOSIS — C61 Malignant neoplasm of prostate: Secondary | ICD-10-CM | POA: Diagnosis not present

## 2021-05-15 DIAGNOSIS — F17200 Nicotine dependence, unspecified, uncomplicated: Secondary | ICD-10-CM | POA: Diagnosis not present

## 2021-06-13 ENCOUNTER — Other Ambulatory Visit: Payer: Self-pay

## 2021-06-13 ENCOUNTER — Ambulatory Visit: Payer: BC Managed Care – PPO

## 2021-06-13 ENCOUNTER — Telehealth: Payer: Self-pay

## 2021-06-13 ENCOUNTER — Ambulatory Visit (INDEPENDENT_AMBULATORY_CARE_PROVIDER_SITE_OTHER): Payer: BC Managed Care – PPO

## 2021-06-13 ENCOUNTER — Ambulatory Visit (INDEPENDENT_AMBULATORY_CARE_PROVIDER_SITE_OTHER): Payer: BC Managed Care – PPO | Admitting: Podiatry

## 2021-06-13 DIAGNOSIS — M779 Enthesopathy, unspecified: Secondary | ICD-10-CM | POA: Diagnosis not present

## 2021-06-13 DIAGNOSIS — M7751 Other enthesopathy of right foot: Secondary | ICD-10-CM

## 2021-06-13 DIAGNOSIS — R2681 Unsteadiness on feet: Secondary | ICD-10-CM

## 2021-06-13 NOTE — Progress Notes (Signed)
SITUATION ?Patient Name:  Riley Hartman ?MRN:   [id number] ?Reason for Visit: Evaluation for [place] ? ?Patient Report: ?Chief Complaint:   Gait Instability ?Nature of Discomfort/Pain:  Ambulatory Standing ?Location:    bilateral lower extremity ?Onset & Duration:   Gradual and Present longer than 3 months ?Course:    gradually worsening ?Aggravating or Alleviating Factors: Ambulation ? ?OBJECTIVE DATA & MEASUREMENTS ?Prognosis:    Good ?Duration of use:   5 years ? ?Diagnosis: ?  ICD-10-CM   ?1. Gait instability  R26.81   ?  ? ? ?GOALS, NECESSITIES, & JUSTIFICATIONS ?Recommended Device: [place] Balance Brace ?Color:    Black ?Closure:   Velcro ? ?Laterality HCPCS Code Description Justification  ?bilateral [id number] Plastic orthosis, custom molded from a model of the patient, custom fabricated, includes casting and cast preparation. Necessary to provide triplanar support to the foot/ankle complex  ?bilateral L2820 Addition to lower extremity orthosis, soft interface for molded plastic below knee section Necessary to relieve pressure on bony prominences  ? ? ?I certify that Riley Hartman qualifies for and will benefit from an ankle foot orthosis used during ambulation based on meeting all of the following criteria;  ? ?The patient is: ?- Ambulatory, and ?- Has weakness or deformity of the foot and ankle, and ?- Requires stabilization for medical reasons, and ?- Has the potential to benefit functionally ? ?The patient?s medical record contains sufficient documentation of the patients medical condition to substantiate the necessity for the type and quantity of the items ordered. ? ?The goals of this therapy: ?- Improve Mobility ?- Improve Lower Extremity Stability ?- Decrease Pain ?- Facilitate Soft Tissue Healing ?- Facilitate Immobilization, healing and treatment of an injury ? ?Necessity of Ankle Foot Orthotic molded to patient model: ?A custom (vs. prefabricated) ankle foot orthosis has been  prescribed based on the following criteria which are specific to the condition of this patient; ?- The patient could not be fit with a prefabricated AFO ?- The condition necessitating the orthosis is expected to be permanent or of longstanding duration (more than 6 months) ?- There is need to control the ankle or foot in more than one plane ?- The patient has a documented neurological, circulatory, or orthopedic condition that requires custom fabrication over a model to prevent tissue injury ?- The patient has a healing fracture that lacks normal anatomical integrity or anthropometric proportions ? ?I hereby certify that the ankle foot orthotic described above is a rigid or semi-rigid device which is used for the purpose of supporting a weak or deformed body member or restricting or eliminating motion in a diseased or injured part of the body. It is designed to provide support and counterforce on the limb or body part that is being braced. In my opinion, the custom molded ankle foot orthosis is both reasonable and necessary in reference to accepted standards of medical practice in the treatment  ?of the patient condition and rehabilitation. ? ?ACTIONS PERFORMED ?Patient was evaluated and casted for [place] AFO via [place]. Procedure was explained to patient. Patient tolerated procedure. patient selected device color and closure method.  ? ?PLAN ?Patient to return in four to six weeks for fitting and delivery of device. Plan of care was explained to and agreed upon by patient. All questions were answered and concerns addressed. ? ? ? ? ? ?

## 2021-06-13 NOTE — Telephone Encounter (Signed)
Casts sent to central fab ?

## 2021-06-14 NOTE — Progress Notes (Signed)
Subjective:  ? ?Patient ID: Riley Hartman, male   DOB: 61 y.o.   MRN: [id number]  ? ?HPI ?Patient presents stating he is getting more instability on his left side secondary to his previous stroke and does need some kind of brace due to coordination issues and on his right he is got a painful corn second toe with inflammation and inability to wear shoe gear comfortably ? ? ?ROS ? ? ?   ?Objective:  ?Physical Exam  ?Neurovascular status intact with patient found to have mild reduction of strength on the left foot and leg and no indications of loss of motion of the subtalar midtarsal joint.  Right second toe shows inflammation of the inner phalangeal joint medial side second toe painful when pressed.  Patient is currently undergoing treatment for prostate cancer ? ?   ?Assessment:  ?Inflammatory capsulitis of the inner phalangeal joint digit to right with keratotic lesion along with instability issues left from previous stroke of several years ago ? ?   ?Plan:  ?H&P reviewed both conditions and for the right I went ahead did sterile prep injected the inner phalangeal joint 2 mg Dexasone Kenalog debrided lesion applied padding.  For the left I did discuss balance bracing which I think could be of benefit and strengthening exercises and proprioception improvement.  Patient is referred to our pedorthist who agreed and balance brace will be made for patient ? ?X-rays right indicate there is an abutment of the hallux against the second toe right with pressure occurring between the 2 digits ?   ? ? ?

## 2021-06-19 ENCOUNTER — Encounter: Payer: Self-pay | Admitting: Family Medicine

## 2021-06-19 ENCOUNTER — Ambulatory Visit (INDEPENDENT_AMBULATORY_CARE_PROVIDER_SITE_OTHER): Payer: BC Managed Care – PPO | Admitting: Family Medicine

## 2021-06-19 VITALS — BP 136/80 | HR 71 | Temp 98.5°F | Ht 72.0 in | Wt 198.5 lb

## 2021-06-19 DIAGNOSIS — Z125 Encounter for screening for malignant neoplasm of prostate: Secondary | ICD-10-CM

## 2021-06-19 DIAGNOSIS — Z0000 Encounter for general adult medical examination without abnormal findings: Secondary | ICD-10-CM

## 2021-06-19 LAB — BASIC METABOLIC PANEL
BUN: 16 mg/dL (ref 6–23)
CO2: 29 mEq/L (ref 19–32)
Calcium: 9.5 mg/dL (ref 8.4–10.5)
Chloride: 104 mEq/L (ref 96–112)
Creatinine, Ser: 0.82 mg/dL (ref 0.40–1.50)
GFR: 94.01 mL/min (ref >60.00)
Glucose, Bld: 85 mg/dL (ref 70–99)
Potassium: 4.3 mEq/L (ref 3.5–5.1)
Sodium: 141 mEq/L (ref 135–145)

## 2021-06-19 LAB — LIPID PANEL
Cholesterol: 211 mg/dL — ABNORMAL HIGH (ref 0–200)
HDL: 46 mg/dL (ref >39.00)
NonHDL: 165.22
Total CHOL/HDL Ratio: 5
Triglycerides: 205 mg/dL — ABNORMAL HIGH (ref 0.0–149.0)
VLDL: 41 mg/dL — ABNORMAL HIGH (ref 0.0–40.0)

## 2021-06-19 LAB — CBC WITH DIFFERENTIAL/PLATELET
Basophils Absolute: 0.1 10*3/uL (ref 0.0–0.1)
Basophils Relative: 0.7 % (ref 0.0–3.0)
Eosinophils Absolute: 0.2 10*3/uL (ref 0.0–0.7)
Eosinophils Relative: 2.4 % (ref 0.0–5.0)
HCT: 39.4 % (ref 39.0–52.0)
Hemoglobin: 13.3 g/dL (ref 13.0–17.0)
Lymphocytes Relative: 13.2 % (ref 12.0–46.0)
Lymphs Abs: 0.9 10*3/uL (ref 0.7–4.0)
MCHC: 33.7 g/dL (ref 30.0–36.0)
MCV: 88 fl (ref 78.0–100.0)
Monocytes Absolute: 0.4 10*3/uL (ref 0.1–1.0)
Monocytes Relative: 6 % (ref 3.0–12.0)
Neutro Abs: 5.4 10*3/uL (ref 1.4–7.7)
Neutrophils Relative %: 77.7 % — ABNORMAL HIGH (ref 43.0–77.0)
Platelets: 169 10*3/uL (ref 150.0–400.0)
RBC: 4.48 Mil/uL (ref 4.22–5.81)
RDW: 13.2 % (ref 11.5–15.5)
WBC: 6.9 10*3/uL (ref 4.0–10.5)

## 2021-06-19 LAB — LDL CHOLESTEROL, DIRECT: Direct LDL: 137 mg/dL

## 2021-06-19 LAB — HEPATIC FUNCTION PANEL
ALT: 10 U/L (ref 0–53)
AST: 12 U/L (ref 0–37)
Albumin: 4.4 g/dL (ref 3.5–5.2)
Alkaline Phosphatase: 60 U/L (ref 39–117)
Bilirubin, Direct: 0.1 mg/dL (ref 0.0–0.3)
Total Bilirubin: 0.5 mg/dL (ref 0.2–1.2)
Total Protein: 6.6 g/dL (ref 6.0–8.3)

## 2021-06-19 LAB — TSH: TSH: 1.46 u[IU]/mL (ref 0.35–5.50)

## 2021-06-19 LAB — PSA: PSA: 0 ng/mL — ABNORMAL LOW (ref 0.10–4.00)

## 2021-06-19 LAB — HEMOGLOBIN A1C: Hgb A1c MFr Bld: 6 % (ref 4.6–6.5)

## 2021-06-19 MED ORDER — CLONAZEPAM 1 MG PO TABS: ORAL_TABLET | ORAL | 5 refills | Status: DC

## 2021-06-19 MED ORDER — LISDEXAMFETAMINE DIMESYLATE 50 MG PO CAPS: 50.0000 mg | ORAL_CAPSULE | Freq: Every day | ORAL | 0 refills | Status: DC

## 2021-06-19 MED ORDER — AMLODIPINE BESYLATE 5 MG PO TABS: 5.0000 mg | ORAL_TABLET | Freq: Every day | ORAL | 5 refills | Status: DC

## 2021-06-19 MED ORDER — CLONAZEPAM 1 MG PO TABS: 1.0000 mg | ORAL_TABLET | Freq: Two times a day (BID) | ORAL | 5 refills | Status: DC | PRN

## 2021-06-19 NOTE — Progress Notes (Signed)
? ?Subjective:  ? ? Patient ID: Riley Hartman, male    DOB: 1960/11/26, 61 y.o.   MRN: [id number] ? ?HPI ?Here for a well exam. He feels well but he has been getting consistently high BP readings at home, often in the 170s over 100s range. He also asks to try Vyvanse instead of Adderall XR because it may save him some money.  ? ? ?Review of Systems  ?Constitutional: Negative.   ?HENT: Negative.    ?Eyes: Negative.   ?Respiratory: Negative.    ?Cardiovascular: Negative.   ?Gastrointestinal: Negative.   ?Genitourinary: Negative.   ?Musculoskeletal: Negative.   ?Skin: Negative.   ?Neurological: Negative.   ?Psychiatric/Behavioral: Negative.    ? ?   ?Objective:  ? Physical Exam ?Constitutional:   ?   General: He is not in acute distress. ?   Appearance: Normal appearance. He is well-developed. He is not diaphoretic.  ?HENT:  ?   Head: Normocephalic and atraumatic.  ?   Right Ear: External ear normal.  ?   Left Ear: External ear normal.  ?   Nose: Nose normal.  ?   Mouth/Throat:  ?   Pharynx: No oropharyngeal exudate.  ?Eyes:  ?   General: No scleral icterus.    ?   Right eye: No discharge.     ?   Left eye: No discharge.  ?   Conjunctiva/sclera: Conjunctivae normal.  ?   Pupils: Pupils are equal, round, and reactive to light.  ?Neck:  ?   Thyroid: No thyromegaly.  ?   Vascular: No JVD.  ?   Trachea: No tracheal deviation.  ?Cardiovascular:  ?   Rate and Rhythm: Normal rate and regular rhythm.  ?   Heart sounds: Normal heart sounds. No murmur heard. ?  No friction rub. No gallop.  ?Pulmonary:  ?   Effort: Pulmonary effort is normal. No respiratory distress.  ?   Breath sounds: Normal breath sounds. No wheezing or rales.  ?Chest:  ?   Chest wall: No tenderness.  ?Abdominal:  ?   General: Bowel sounds are normal. There is no distension.  ?   Palpations: Abdomen is soft. There is no mass.  ?   Tenderness: There is no abdominal tenderness. There is no guarding or rebound.  ?Genitourinary: ?   Penis: No tenderness.    ?Musculoskeletal:     ?   General: No tenderness. Normal range of motion.  ?   Cervical back: Neck supple.  ?Lymphadenopathy:  ?   Cervical: No cervical adenopathy.  ?Skin: ?   General: Skin is warm and dry.  ?   Coloration: Skin is not pale.  ?   Findings: No erythema or rash.  ?Neurological:  ?   Mental Status: He is alert and oriented to person, place, and time.  ?   Cranial Nerves: No cranial nerve deficit.  ?   Motor: No abnormal muscle tone.  ?   Coordination: Coordination normal.  ?   Deep Tendon Reflexes: Reflexes are normal and symmetric. Reflexes normal.  ?Psychiatric:     ?   Behavior: Behavior normal.     ?   Thought Content: Thought content normal.     ?   Judgment: Judgment normal.  ? ? ? ? ? ?   ?Assessment & Plan:  ?Well exam. We discussed diet and exercise. Get fasting labs. We will let him try Vyvanse 50 mg daily and stop the Adderall XR for the ADHD. For the HTN  we will add Amlodipine 5 mg daily to the Lisinopril. Recheck in 4 weeks.  ?Alysia Penna, MD ? ? ?

## 2021-07-09 ENCOUNTER — Telehealth: Payer: Self-pay | Admitting: Family Medicine

## 2021-07-09 NOTE — Telephone Encounter (Signed)
Patient called in to check in on the update on PA for lisdexamfetamine (VYVANSE) 50 MG capsule [[id number]] .  ? ?Please advise. ?

## 2021-07-11 NOTE — Telephone Encounter (Signed)
PA for Vyvanse  has been submitted to pt plan, pt is aware that he will be notified when approved ?

## 2021-07-15 NOTE — Telephone Encounter (Signed)
Pt PA was denied due to quantity, will send an appeal to pt plan ?

## 2021-07-16 ENCOUNTER — Telehealth: Payer: Self-pay | Admitting: Family Medicine

## 2021-07-16 NOTE — Telephone Encounter (Signed)
Patient is prescribed Vyvanse '50mg'$ .   See previous telephone note from 07/09/21. ?

## 2021-07-16 NOTE — Telephone Encounter (Signed)
?  Pt call and stated his Ins will not approve his authorization and want dr.fry to recommend him another medication and want a call back. ?

## 2021-07-17 MED ORDER — AMPHETAMINE-DEXTROAMPHETAMINE 20 MG PO TABS: 20.0000 mg | ORAL_TABLET | Freq: Two times a day (BID) | ORAL | 0 refills | Status: DC

## 2021-07-17 NOTE — Telephone Encounter (Signed)
What about Methylphenidate 20 mg like he used to take a few years ago?  ?

## 2021-07-17 NOTE — Telephone Encounter (Signed)
I sent in one month for him to try  ?

## 2021-07-17 NOTE — Telephone Encounter (Signed)
Called and spoke with patient.   He stated that he still takes the Methylphenidate '20mg'$  at a cost of $120 per month.       Patient requested for [facility]all '20mg'$  [facility] [place].   ?

## 2021-07-18 ENCOUNTER — Other Ambulatory Visit: Payer: Self-pay | Admitting: Family Medicine

## 2021-07-18 ENCOUNTER — Ambulatory Visit (INDEPENDENT_AMBULATORY_CARE_PROVIDER_SITE_OTHER): Payer: BC Managed Care – PPO

## 2021-07-18 DIAGNOSIS — R2681 Unsteadiness on feet: Secondary | ICD-10-CM

## 2021-07-18 DIAGNOSIS — M779 Enthesopathy, unspecified: Secondary | ICD-10-CM

## 2021-07-18 NOTE — Telephone Encounter (Signed)
Patient informed of the message below.

## 2021-07-18 NOTE — Progress Notes (Signed)
SITUATION ?Reason for Visit: Dispensation and Fitting of Custom Molded Gauntlet ?Patient Report: Patient reports comfort in ambulation and understands all instructions. ? ?OBJECTIVE DATA ?Patient History / Diagnosis:   ?  ICD-10-CM   ?1. Gait instability  R26.81   ?  ?2. Capsulitis  M77.9   ?  ? ? ?Provided Device:  Custom Molded Gauntlet: Style [place] Balance Brace Bilateral ? ?Goals of Orthosis: ?- Improve gait ?- Decrease energy expenditure during the gait cycle ?- Improve balance ?- Stabilize motion at ankle and subtalar joint ?- Compensate for muscle weakness ?- Facilitate motion ?- Provide triplanar ankle and foot stabilization for weight bearing activities ? ?Device Justification: ?- Patient is ambulatory  ?- Device is medically necessary as part of the overall treatment due to the patient's condition and related symptoms ?- It is anticipated that the patient will benefit functionally with use of the device.  ?- The custom device is utilized in an attempt to avoid the need for surgery and because a prefabricated device is inappropriate. ? ?Upon gait analysis, the device appeared to be fitting well and the patient states that the device is comfortable. ? ?ACTIONS PERFORMED ?Patient was fit with Administrator. Patient tolerated fitting procedure. Fit of the device is good. Patient was able to apply properly and ambulate without distress. Device function is to restrict and limit motion and provide stabilization in the ankle joint.  ? ?Goals and function of this device were explained in detail to the patient. The patient was shown how to properly apply, wear, and care for the device. It was explained that the device will fit and function best in an adjustable-closure shoe with a firm heel counter and a wide base of support. When the device was dispensed, it was suitable for the patient's condition and not substandard. No guarantees were given. Precautions were reviewed.   ? ?Written instructions, warranty information, and a copy of DMEPOS Supplier Standards were provided. All questions answered and concerns addressed. ? ?PLAN ?Patient is to follow up in one week or as necessary (PRN). Plan of care was discussed with and agreed upon by patient. ? ? ?

## 2021-07-26 ENCOUNTER — Other Ambulatory Visit: Payer: Self-pay | Admitting: Family Medicine

## 2021-07-29 ENCOUNTER — Other Ambulatory Visit: Payer: Self-pay | Admitting: Family Medicine

## 2021-08-14 DIAGNOSIS — Z8673 Personal history of transient ischemic attack (TIA), and cerebral infarction without residual deficits: Secondary | ICD-10-CM | POA: Diagnosis not present

## 2021-08-14 DIAGNOSIS — F17200 Nicotine dependence, unspecified, uncomplicated: Secondary | ICD-10-CM | POA: Diagnosis not present

## 2021-08-14 DIAGNOSIS — C61 Malignant neoplasm of prostate: Secondary | ICD-10-CM | POA: Diagnosis not present

## 2021-08-14 DIAGNOSIS — I6389 Other cerebral infarction: Secondary | ICD-10-CM | POA: Diagnosis not present

## 2021-08-14 DIAGNOSIS — Z7952 Long term (current) use of systemic steroids: Secondary | ICD-10-CM | POA: Diagnosis not present

## 2021-08-14 DIAGNOSIS — Z923 Personal history of irradiation: Secondary | ICD-10-CM | POA: Diagnosis not present

## 2021-08-14 DIAGNOSIS — I10 Essential (primary) hypertension: Secondary | ICD-10-CM | POA: Diagnosis not present

## 2021-08-14 DIAGNOSIS — R590 Localized enlarged lymph nodes: Secondary | ICD-10-CM | POA: Diagnosis not present

## 2021-08-14 DIAGNOSIS — Z79899 Other long term (current) drug therapy: Secondary | ICD-10-CM | POA: Diagnosis not present

## 2021-08-14 DIAGNOSIS — R208 Other disturbances of skin sensation: Secondary | ICD-10-CM | POA: Diagnosis not present

## 2021-08-14 DIAGNOSIS — R5383 Other fatigue: Secondary | ICD-10-CM | POA: Diagnosis not present

## 2021-08-20 ENCOUNTER — Telehealth: Payer: Self-pay | Admitting: Family Medicine

## 2021-08-20 NOTE — Telephone Encounter (Signed)
Pt is calling and need refill on amphetamine-dextroamphetamine (ADDERALL) 20 MG tablet   ?[id number] [place], [place] - [address] Phone:  [phone] ?Fax:  [phone] ?  ? ?

## 2021-08-20 NOTE — Telephone Encounter (Signed)
Last OV physical- 06/19/2021 ?Last refill- 07/17/21-60 tabs,  0 refills ? ?No future OV scheduled.  ? ?

## 2021-08-21 MED ORDER — AMPHETAMINE-DEXTROAMPHETAMINE 20 MG PO TABS: 20.0000 mg | ORAL_TABLET | Freq: Two times a day (BID) | ORAL | 0 refills | Status: DC

## 2021-08-21 NOTE — Telephone Encounter (Signed)
Pharmacy will contact patient when prescription ready for pickup. 

## 2021-08-21 NOTE — Telephone Encounter (Signed)
Done

## 2021-09-21 ENCOUNTER — Other Ambulatory Visit: Payer: Self-pay | Admitting: Family Medicine

## 2021-09-23 DIAGNOSIS — M79675 Pain in left toe(s): Secondary | ICD-10-CM | POA: Diagnosis not present

## 2021-09-23 DIAGNOSIS — M109 Gout, unspecified: Secondary | ICD-10-CM | POA: Diagnosis not present

## 2021-09-23 DIAGNOSIS — M12572 Traumatic arthropathy, left ankle and foot: Secondary | ICD-10-CM | POA: Diagnosis not present

## 2021-10-01 ENCOUNTER — Telehealth: Payer: Self-pay | Admitting: Family Medicine

## 2021-10-01 NOTE — Telephone Encounter (Signed)
Pharmacy only had enough to fill half on this prescription, and pharmacy needs another prescription written to fulfil amphetamine-dextroamphetamine (ADDERALL) 20 MG tablet

## 2021-10-01 NOTE — Telephone Encounter (Signed)
[facility], spoke with Tessa.   Patient received 15 day supply of Adderall due to supply ran out.   Requesting new prescription to be sent to the pharmacy.

## 2021-10-02 MED ORDER — AMPHETAMINE-DEXTROAMPHETAMINE 20 MG PO TABS: 20.0000 mg | ORAL_TABLET | Freq: Two times a day (BID) | ORAL | 0 refills | Status: DC

## 2021-10-02 NOTE — Telephone Encounter (Signed)
Done

## 2021-10-19 ENCOUNTER — Other Ambulatory Visit: Payer: Self-pay | Admitting: Family Medicine

## 2021-11-09 ENCOUNTER — Other Ambulatory Visit: Payer: Self-pay | Admitting: Family Medicine

## 2021-11-13 DIAGNOSIS — Z79818 Long term (current) use of other agents affecting estrogen receptors and estrogen levels: Secondary | ICD-10-CM | POA: Diagnosis not present

## 2021-11-13 DIAGNOSIS — C61 Malignant neoplasm of prostate: Secondary | ICD-10-CM | POA: Diagnosis not present

## 2021-11-13 DIAGNOSIS — I6389 Other cerebral infarction: Secondary | ICD-10-CM | POA: Diagnosis not present

## 2021-11-13 DIAGNOSIS — Z191 Hormone sensitive malignancy status: Secondary | ICD-10-CM | POA: Diagnosis not present

## 2021-11-13 DIAGNOSIS — Z7952 Long term (current) use of systemic steroids: Secondary | ICD-10-CM | POA: Diagnosis not present

## 2021-11-13 DIAGNOSIS — R413 Other amnesia: Secondary | ICD-10-CM | POA: Diagnosis not present

## 2021-11-13 DIAGNOSIS — I10 Essential (primary) hypertension: Secondary | ICD-10-CM | POA: Diagnosis not present

## 2021-11-13 DIAGNOSIS — Z8673 Personal history of transient ischemic attack (TIA), and cerebral infarction without residual deficits: Secondary | ICD-10-CM | POA: Diagnosis not present

## 2021-11-13 DIAGNOSIS — Z79899 Other long term (current) drug therapy: Secondary | ICD-10-CM | POA: Diagnosis not present

## 2021-11-13 DIAGNOSIS — R29818 Other symptoms and signs involving the nervous system: Secondary | ICD-10-CM | POA: Diagnosis not present

## 2021-11-13 DIAGNOSIS — F17210 Nicotine dependence, cigarettes, uncomplicated: Secondary | ICD-10-CM | POA: Diagnosis not present

## 2021-11-13 DIAGNOSIS — R208 Other disturbances of skin sensation: Secondary | ICD-10-CM | POA: Diagnosis not present

## 2021-11-15 ENCOUNTER — Emergency Department (HOSPITAL_COMMUNITY): Payer: BC Managed Care – PPO

## 2021-11-15 ENCOUNTER — Other Ambulatory Visit: Payer: Self-pay

## 2021-11-15 ENCOUNTER — Encounter (HOSPITAL_COMMUNITY): Payer: Self-pay

## 2021-11-15 ENCOUNTER — Observation Stay (HOSPITAL_COMMUNITY)
Admission: EM | Admit: 2021-11-15 | Discharge: 2021-11-16 | Discharge status: Against Medical Advice | Payer: BC Managed Care – PPO | Attending: Student | Admitting: Student

## 2021-11-15 DIAGNOSIS — F17210 Nicotine dependence, cigarettes, uncomplicated: Secondary | ICD-10-CM | POA: Insufficient documentation

## 2021-11-15 DIAGNOSIS — Z20822 Contact with and (suspected) exposure to covid-19: Secondary | ICD-10-CM | POA: Insufficient documentation

## 2021-11-15 DIAGNOSIS — C61 Malignant neoplasm of prostate: Secondary | ICD-10-CM | POA: Insufficient documentation

## 2021-11-15 DIAGNOSIS — F909 Attention-deficit hyperactivity disorder, unspecified type: Secondary | ICD-10-CM | POA: Diagnosis present

## 2021-11-15 DIAGNOSIS — R531 Weakness: Secondary | ICD-10-CM

## 2021-11-15 DIAGNOSIS — Z66 Do not resuscitate: Secondary | ICD-10-CM | POA: Insufficient documentation

## 2021-11-15 DIAGNOSIS — I10 Essential (primary) hypertension: Secondary | ICD-10-CM | POA: Insufficient documentation

## 2021-11-15 DIAGNOSIS — Z79899 Other long term (current) drug therapy: Secondary | ICD-10-CM | POA: Insufficient documentation

## 2021-11-15 DIAGNOSIS — Z8673 Personal history of transient ischemic attack (TIA), and cerebral infarction without residual deficits: Secondary | ICD-10-CM | POA: Diagnosis not present

## 2021-11-15 DIAGNOSIS — R197 Diarrhea, unspecified: Secondary | ICD-10-CM | POA: Diagnosis not present

## 2021-11-15 DIAGNOSIS — R6883 Chills (without fever): Secondary | ICD-10-CM | POA: Diagnosis not present

## 2021-11-15 DIAGNOSIS — Z7952 Long term (current) use of systemic steroids: Secondary | ICD-10-CM | POA: Insufficient documentation

## 2021-11-15 DIAGNOSIS — I2510 Atherosclerotic heart disease of native coronary artery without angina pectoris: Secondary | ICD-10-CM | POA: Insufficient documentation

## 2021-11-15 DIAGNOSIS — E860 Dehydration: Secondary | ICD-10-CM

## 2021-11-15 DIAGNOSIS — Z720 Tobacco use: Secondary | ICD-10-CM | POA: Diagnosis present

## 2021-11-15 LAB — CBC WITH DIFFERENTIAL/PLATELET
Abs Immature Granulocytes: 0.03 10*3/uL (ref 0.00–0.07)
Basophils Absolute: 0 10*3/uL (ref 0.0–0.1)
Basophils Relative: 0 %
Eosinophils Absolute: 0 10*3/uL (ref 0.0–0.5)
Eosinophils Relative: 0 %
HCT: 45.4 % (ref 39.0–52.0)
Hemoglobin: 15.4 g/dL (ref 13.0–17.0)
Immature Granulocytes: 0 %
Lymphocytes Relative: 7 %
Lymphs Abs: 0.9 10*3/uL (ref 0.7–4.0)
MCH: 29.9 pg (ref 26.0–34.0)
MCHC: 33.9 g/dL (ref 30.0–36.0)
MCV: 88.2 fL (ref 80.0–100.0)
Monocytes Absolute: 0.9 10*3/uL (ref 0.1–1.0)
Monocytes Relative: 7 %
Neutro Abs: 10.7 10*3/uL — ABNORMAL HIGH (ref 1.7–7.7)
Neutrophils Relative %: 86 %
Platelets: 183 10*3/uL (ref 150–400)
RBC: 5.15 MIL/uL (ref 4.22–5.81)
RDW: 13.5 % (ref 11.5–15.5)
WBC: 12.5 10*3/uL — ABNORMAL HIGH (ref 4.0–10.5)
nRBC: 0 % (ref 0.0–0.2)

## 2021-11-15 LAB — COMPREHENSIVE METABOLIC PANEL
ALT: 13 U/L (ref 0–44)
AST: 15 U/L (ref 15–41)
Albumin: 4.3 g/dL (ref 3.5–5.0)
Alkaline Phosphatase: 58 U/L (ref 38–126)
Anion gap: 13 (ref 5–15)
BUN: 17 mg/dL (ref 8–23)
CO2: 18 mmol/L — ABNORMAL LOW (ref 22–32)
Calcium: 9.3 mg/dL (ref 8.9–10.3)
Chloride: 105 mmol/L (ref 98–111)
Creatinine, Ser: 0.85 mg/dL (ref 0.61–1.24)
GFR, Estimated: >60 mL/min (ref >60)
Glucose, Bld: 136 mg/dL — ABNORMAL HIGH (ref 70–99)
Potassium: 3.1 mmol/L — ABNORMAL LOW (ref 3.5–5.1)
Sodium: 136 mmol/L (ref 135–145)
Total Bilirubin: 0.5 mg/dL (ref 0.3–1.2)
Total Protein: 7.9 g/dL (ref 6.5–8.1)

## 2021-11-15 LAB — MAGNESIUM: Magnesium: 1.9 mg/dL (ref 1.7–2.4)

## 2021-11-15 MED ORDER — LOPERAMIDE HCL 2 MG PO CAPS
4.0000 mg | ORAL_CAPSULE | Freq: Once | ORAL | Status: AC
Start: 2021-11-16 — End: 2021-11-16
  Administered 2021-11-16: 4 mg via ORAL
  Filled 2021-11-15: qty 2

## 2021-11-15 MED ORDER — LACTATED RINGERS IV BOLUS
2000.0000 mL | Freq: Once | INTRAVENOUS | Status: AC
  Administered 2021-11-15: 2000 mL via INTRAVENOUS

## 2021-11-15 NOTE — ED Provider Notes (Signed)
[facility] DEPT Provider Note   CSN: [id number] Arrival date & time: 11/15/21  2135     History {Add pertinent medical, surgical, social history, OB history to HPI:1} Chief Complaint  Patient presents with   Diarrhea   Weakness    NAKOA GANUS is a 61 y.o. male.  The history is provided by the patient.  Diarrhea Quality:  Watery Severity:  Severe Onset quality:  Sudden Duration:  24 hours Timing:  Intermittent Progression:  Worsening Relieved by:  Nothing Associated symptoms: fever   Associated symptoms: no abdominal pain, no recent cough and no vomiting   Weakness Associated symptoms: diarrhea and fever   Associated symptoms: no abdominal pain and no vomiting   Patient with history of prostate cancer, stroke presents with multiple episodes of nonbloody diarrhea the past 24 hours.  He reports chills and fever, no vomiting.  No abdominal pain.  He reports multiple episodes of watery stool.  He reports generalized fatigue and weakness.  He reports lightheadedness, but no syncope.  No traumatic injury      Home Medications Prior to Admission medications   Medication Sig Start Date End Date Taking? Authorizing Provider  abiraterone acetate (ZYTIGA) 250 MG tablet Take 4 tablets (1,000 mg total) by mouth daily. Take on an empty stomach 1 hour before or 2 hours after a meal Patient taking differently: Take 1,000 mg by mouth daily before breakfast. Take on an empty stomach 1 hour before or 2 hours after a meal 10/31/20   Laurey Morale, MD  amLODipine (NORVASC) 5 MG tablet TAKE 1 TABLET (5 MG TOTAL) BY MOUTH DAILY. 09/23/21   Laurey Morale, MD  amphetamine-dextroamphetamine (ADDERALL) 20 MG tablet Take 1 tablet (20 mg total) by mouth 2 (two) times daily. 10/02/21 11/01/21  Laurey Morale, MD  aspirin EC 325 MG tablet Take 1 tablet (325 mg total) by mouth daily. 12/30/17   Frann Rider, NP  clonazePAM (KLONOPIN) 1 MG tablet Take 1 tablet (1 mg total)  by mouth 2 (two) times daily as needed for anxiety. TAKE  1 mg  BY MOUTH TWICE DAILY AS NEEDED 06/19/21   Laurey Morale, MD  diphenoxylate-atropine (LOMOTIL) 2.5-0.025 MG tablet Take 1 tablet by mouth 4 (four) times daily as needed. 03/06/21   [provider]  finasteride (PROSCAR) 5 MG tablet TAKE 1 TABLET (5 MG TOTAL) BY MOUTH DAILY. 10/21/21   Laurey Morale, MD  lisinopril (ZESTRIL) 20 MG tablet TAKE 1 TABLET BY MOUTH EVERY DAY 07/29/21   Laurey Morale, MD  OVER THE COUNTER MEDICATION Take 15 mLs by mouth See admin instructions. Super beets powder - mix one tablespoonful in water and drink daily    [provider]  predniSONE (DELTASONE) 5 MG tablet Take 5 mg by mouth every morning. 10/19/20   [provider]  tamsulosin (FLOMAX) 0.4 MG CAPS capsule TAKE 1 CAPSULE BY MOUTH EVERY DAY 09/23/21   Laurey Morale, MD      Allergies    Gadolinium derivatives    Review of Systems   Review of Systems  Constitutional:  Positive for fatigue and fever.  Gastrointestinal:  Positive for diarrhea. Negative for abdominal pain, blood in stool and vomiting.  Neurological:  Positive for weakness.    Physical Exam Updated Vital Signs BP (!) 147/93 (BP Location: Right Arm)   Pulse (!) 115   Temp 98.9 F (37.2 C) (Oral)   Resp 18   Ht 1.829 m (6')  Wt 89.8 kg   SpO2 96%   BMI 26.85 kg/m  Physical Exam CONSTITUTIONAL: Ill-appearing HEAD: Normocephalic/atraumatic EYES: EOMI/PERRL, no icterus ENMT: Mucous membranes dry NECK: supple no meningeal signs SPINE/BACK:entire spine nontender CV: S1/S2 noted, tachycardia LUNGS: Lungs are clear to auscultation bilaterally, no apparent distress ABDOMEN: soft, nontender, no rebound or guarding, bowel sounds noted throughout abdomen GU:no cva tenderness NEURO: Pt is awake/alert/appropriate, moves all extremitiesx4.  No facial droop.   EXTREMITIES: pulses normal/equal, full ROM SKIN: warm, color normal, poor skin turgor noted PSYCH:  no abnormalities of mood noted, alert and oriented to situation  ED Results / Procedures / Treatments   Labs (all labs ordered are listed, but only abnormal results are displayed) Labs Reviewed  CBC WITH DIFFERENTIAL/PLATELET - Abnormal; Notable for the following components:      Result Value   WBC 12.5 (*)    Neutro Abs 10.7 (*)    All other components within normal limits  COMPREHENSIVE METABOLIC PANEL - Abnormal; Notable for the following components:   Potassium 3.1 (*)    CO2 18 (*)    Glucose, Bld 136 (*)    All other components within normal limits  RESP PANEL BY RT-PCR (FLU A&B, COVID) ARPGX2  GASTROINTESTINAL PANEL BY PCR, STOOL (REPLACES STOOL CULTURE)  C DIFFICILE QUICK SCREEN W PCR REFLEX    MAGNESIUM  URINALYSIS, ROUTINE W REFLEX MICROSCOPIC    EKG None  Radiology DG Chest 2 View  Result Date: 11/15/2021 CLINICAL DATA:  Diarrhea, weakness, chills, and body ache. Evaluate for pneumonia. EXAM: CHEST - 2 VIEW COMPARISON:  05/24/2017, 09/20/2008. FINDINGS: The heart size and mediastinal contours are within normal limits. Both lungs are clear. Mild degenerative changes in the thoracic spine. An old compression deformity is noted at T6. No acute osseous abnormality. IMPRESSION: No active cardiopulmonary disease. Electronically Signed   By: Brett Fairy M.D.   On: 11/15/2021 22:28    Procedures Procedures  {Document cardiac monitor, telemetry assessment procedure when appropriate:1}  Medications Ordered in ED Medications  lactated ringers bolus 2,000 mL (has no administration in time range)  loperamide (IMODIUM) capsule 4 mg (has no administration in time range)    ED Course/ Medical Decision Making/ A&P Clinical Course as of 11/15/21 2348  Fri Nov 15, 2021  2347 CO2(!): 18 Mild metabolic acidosis/dehydration [DW]  2347 WBC(!): 12.5 Leukocytosis [DW]  2347 Patient history of prostate cancer on androgen deprivation therapy presenting with multiple episodes of  diarrhea over the past 24 hours.  Patient appears very dehydrated.  We will give IV fluids and reassess.  He has no focal abdominal tenderness, will defer CT imaging [DW]    Clinical Course User Index [DW] Ripley Fraise, MD                           Medical Decision Making Amount and/or Complexity of Data Reviewed Labs:  Decision-making details documented in ED Course. ECG/medicine tests: ordered.  Risk Prescription drug management.   This patient presents to the ED for concern of diarrhea, this involves an extensive number of treatment options, and is a complaint that carries with it a high risk of complications and morbidity.  The differential diagnosis includes but is not limited to colitis, diverticulitis, enteritis, C. difficile colitis  Comorbidities that complicate the patient evaluation: Patient's presentation is complicated by their history of prostate cancer   Additional history obtained: Records reviewed Care Everywhere/External Records  Lab Tests: I Ordered, and personally  interpreted labs.  The pertinent results include: Evidence of dehydration and leukocytosis  Imaging Studies ordered: I ordered imaging studies including X-ray chest   I independently visualized and interpreted imaging which showed no acute finding I agree with the radiologist interpretation  Cardiac Monitoring: The patient was maintained on a cardiac monitor.  I personally viewed and interpreted the cardiac monitor which showed an underlying rhythm of:  sinus tachycardia  Medicines ordered and prescription drug management: I ordered medication including IV fluids for dehydration Imodium for diarrhea Reevaluation of the patient after these medicines showed that the patient    {resolved/improved/worsened:23923::"improved"}  Test Considered: Patient is low risk / negative by ***, therefore do not feel that *** is indicated.  Critical Interventions:  ***  Consultations Obtained: I  requested consultation with the {consultation:26851}, and discussed  findings as well as pertinent plan - they recommend: ***  Reevaluation: After the interventions noted above, I reevaluated the patient and found that they have :{resolved/improved/worsened:23923::"improved"}  Complexity of problems addressed: Patient's presentation is most consistent with  {FSEL:95320}  Disposition: After consideration of the diagnostic results and the patient's response to treatment,  I feel that the patent would benefit from {disposition:26850}.     {Document critical care time when appropriate:1} {Document review of labs and clinical decision tools ie heart score, Chads2Vasc2 etc:1}  {Document your independent review of radiology images, and any outside records:1} {Document your discussion with family members, caretakers, and with consultants:1} {Document social determinants of health affecting pt's care:1} {Document your decision making why or why not admission, treatments were needed:1} Final Clinical Impression(s) / ED Diagnoses Final diagnoses:  None    Rx / DC Orders ED Discharge Orders     None

## 2021-11-15 NOTE — ED Triage Notes (Signed)
Diarrhea, weakness, chills, body aches, and pressure behind eyes x1 day. Denies abd pain, n/v Hx prostate cancer.

## 2021-11-15 NOTE — ED Provider Triage Note (Cosign Needed)
Emergency Medicine Provider Triage Evaluation Note  Riley Hartman , a 61 y.o. male  was evaluated in triage.  Pt complains of weakness, fatigue, diarrhea, fever since yesterday.  Patient has history of prostate cancer.  Active treatment.  Denies cough, abdominal pain, shortness of breath.  Review of Systems  Positive: As above Negative: As above  Physical Exam  BP (!) 147/93 (BP Location: Right Arm)   Pulse (!) 115   Temp 98.9 F (37.2 C) (Oral)   Resp 18   Ht 6' (1.829 m)   Wt 89.8 kg   SpO2 96%   BMI 26.85 kg/m  Gen:   Awake, no distress   Resp:  Normal effort  MSK:   Moves extremities without difficulty  Other:    Medical Decision Making  Medically screening exam initiated at 10:05 PM.  Appropriate orders placed.  Riley Hartman was informed that the remainder of the evaluation will be completed by another provider, this initial triage assessment does not replace that evaluation, and the importance of remaining in the ED until their evaluation is complete.    Evlyn Courier, PA-C 11/15/21 2206

## 2021-11-16 DIAGNOSIS — Z66 Do not resuscitate: Secondary | ICD-10-CM

## 2021-11-16 DIAGNOSIS — Z7952 Long term (current) use of systemic steroids: Secondary | ICD-10-CM

## 2021-11-16 DIAGNOSIS — F909 Attention-deficit hyperactivity disorder, unspecified type: Secondary | ICD-10-CM

## 2021-11-16 DIAGNOSIS — R197 Diarrhea, unspecified: Secondary | ICD-10-CM

## 2021-11-16 DIAGNOSIS — C61 Malignant neoplasm of prostate: Secondary | ICD-10-CM

## 2021-11-16 DIAGNOSIS — Z720 Tobacco use: Secondary | ICD-10-CM

## 2021-11-16 DIAGNOSIS — Z5329 Procedure and treatment not carried out because of patient's decision for other reasons: Secondary | ICD-10-CM

## 2021-11-16 DIAGNOSIS — I10 Essential (primary) hypertension: Secondary | ICD-10-CM

## 2021-11-16 LAB — URINALYSIS, ROUTINE W REFLEX MICROSCOPIC
Bacteria, UA: NONE SEEN
Bilirubin Urine: NEGATIVE
Glucose, UA: NEGATIVE mg/dL
Ketones, ur: NEGATIVE mg/dL
Leukocytes,Ua: NEGATIVE
Nitrite: NEGATIVE
Protein, ur: 30 mg/dL — AB
Specific Gravity, Urine: 1.024 (ref 1.005–1.030)
pH: 5 (ref 5.0–8.0)

## 2021-11-16 LAB — RESP PANEL BY RT-PCR (FLU A&B, COVID) ARPGX2
Influenza A by PCR: NEGATIVE
Influenza B by PCR: NEGATIVE
SARS Coronavirus 2 by RT PCR: NEGATIVE

## 2021-11-16 LAB — C DIFFICILE QUICK SCREEN W PCR REFLEX
C Diff antigen: NEGATIVE
C Diff interpretation: NOT DETECTED
C Diff toxin: NEGATIVE

## 2021-11-16 MED ORDER — ONDANSETRON HCL 4 MG/2ML IJ SOLN: 4.0000 mg | Freq: Four times a day (QID) | INTRAMUSCULAR | Status: DC | PRN

## 2021-11-16 MED ORDER — METHYLPREDNISOLONE SODIUM SUCC 125 MG IJ SOLR
125.0000 mg | Freq: Once | INTRAMUSCULAR | Status: AC
  Administered 2021-11-16: 125 mg via INTRAVENOUS
  Filled 2021-11-16: qty 2

## 2021-11-16 MED ORDER — LACTATED RINGERS IV SOLN
INTRAVENOUS | Status: DC
Start: 2021-11-16 — End: 2021-11-16

## 2021-11-16 MED ORDER — CLONAZEPAM 0.5 MG PO TABS: 1.0000 mg | ORAL_TABLET | Freq: Two times a day (BID) | ORAL | Status: DC | PRN

## 2021-11-16 MED ORDER — FINASTERIDE 5 MG PO TABS: 5.0000 mg | ORAL_TABLET | Freq: Every day | ORAL | Status: DC

## 2021-11-16 MED ORDER — PREDNISONE 5 MG PO TABS: 15.0000 mg | ORAL_TABLET | Freq: Every day | ORAL | Status: DC

## 2021-11-16 MED ORDER — TAMSULOSIN HCL 0.4 MG PO CAPS: 0.4000 mg | ORAL_CAPSULE | Freq: Every day | ORAL | Status: DC

## 2021-11-16 MED ORDER — AMLODIPINE BESYLATE 5 MG PO TABS: 5.0000 mg | ORAL_TABLET | Freq: Every day | ORAL | Status: DC

## 2021-11-16 MED ORDER — ACETAMINOPHEN 325 MG PO TABS: 650.0000 mg | ORAL_TABLET | Freq: Four times a day (QID) | ORAL | Status: DC | PRN

## 2021-11-16 MED ORDER — ONDANSETRON HCL 4 MG PO TABS: 4.0000 mg | ORAL_TABLET | Freq: Four times a day (QID) | ORAL | Status: DC | PRN

## 2021-11-16 MED ORDER — HEPARIN SODIUM (PORCINE) 5000 UNIT/ML IJ SOLN: 5000.0000 [IU] | Freq: Three times a day (TID) | INTRAMUSCULAR | Status: DC

## 2021-11-16 MED ORDER — SODIUM CHLORIDE 0.9 % IV BOLUS
1000.0000 mL | Freq: Once | INTRAVENOUS | Status: AC
  Administered 2021-11-16: 1000 mL via INTRAVENOUS

## 2021-11-16 MED ORDER — ACETAMINOPHEN 650 MG RE SUPP: 650.0000 mg | Freq: Four times a day (QID) | RECTAL | Status: DC | PRN

## 2021-11-16 MED ORDER — ABIRATERONE ACETATE 250 MG PO TABS: 1000.0000 mg | ORAL_TABLET | Freq: Every day | ORAL | Status: DC

## 2021-11-16 NOTE — ED Notes (Signed)
Patient states he does not wish to be admitted and would like to go home at this time. Cyndia Skeeters, MD notified by this RN.

## 2021-11-16 NOTE — Assessment & Plan Note (Signed)
Observation telemetry bed.  GI viral panel has been sent.  Start enteric precautions.  Continue IV fluids given his mild metabolic acidosis.

## 2021-11-16 NOTE — Assessment & Plan Note (Signed)
Patient's continued fatigue and feeling of malaise could be due to relative adrenal insufficiency as he has been without his prednisone for about 3 days now.  Given bolus of 125 mg of Solu-Medrol.  Then continue his prednisone at 15 mg a day for 3 days then decreasing back to 5 mg daily.

## 2021-11-16 NOTE — Discharge Summary (Signed)
Physician Discharge Summary   Patient: Riley Hartman MRN: [id number] DOB: 11-24-1960  Admit date:     11/15/2021  Discharge date: 11/16/21  Discharge Physician: Mercy Riding   PCP: Laurey Morale, MD   Disposition: Patient left AGAINST MEDICAL ADVICE   Discharge Diagnoses: Principal Problem:   Acute diarrhea Active Problems:   HTN (hypertension)   ADHD   Tobacco abuse   Adenocarcinoma of prostate, stage 3 (HCC)   Current chronic use of systemic steroids - on chronic prednisone for chronic back pain   DNR (do not resuscitate)  Resolved Problems:   * No resolved hospital problems. [facility] Course: Per H&P, 61 year old white male history of prostate cancer, tobacco abuse, hypertension, prior history of stroke presents to the ER today with 3 to 4-day history of acute diarrhea.  He has had about 30 episodes of diarrhea today.  He continues to have diarrhea.  Denies any fever but has had chills.  Denies any ill contacts.  No one in his house has diarrhea.  Patient is on chronic prednisone 5 mg a day for his chronic back pain.  He has been without prednisone for 3 days due to his diarrhea.  He has had some nausea.  He describes his diarrhea as green and liquid in color.  Denies any blood in it. On arrival temp 98.9 heart rate 115 blood pressure 147/93. C. difficile test was negative. Sodium 136, Tesson 3.1, bicarb 18, BUN of 17, Cr 0.85.  WBC 12.5.  Hgb 15.4.  Platelets 183.  Mg 1.9.  CXR negative.  Patient was given 3 L IV fluid boluses but felt dizzy when he tried to ambulate even after IV fluid bolus.  He was admitted for overnight observation.  GI panel ordered.  The next morning, patient left AMA before I even got a chance to see him.  Per chart review, stable vitals.  No concern about mental status, cognition or medical decision making capacity.  Assessment and Plan: Principal Problem:   Acute diarrhea Active Problems:   HTN (hypertension)   ADHD   Tobacco abuse    Adenocarcinoma of prostate, stage 3 (HCC)   Current chronic use of systemic steroids - on chronic prednisone for chronic back pain   DNR (do not resuscitate)    Consultants: None Procedures performed: None Disposition:  Left AGAINST MEDICAL ADVICE Diet recommendation:  DISCHARGE MEDICATION: Allergies as of 11/16/2021       Reactions   Gadolinium Derivatives Other (See Comments)   Slight low BP, lethargy, fatigue x 1 week        Medication List     ASK your doctor about these medications    abiraterone acetate 250 MG tablet Commonly known as: Zytiga Take 4 tablets (1,000 mg total) by mouth daily. Take on an empty stomach 1 hour before or 2 hours after a meal   amLODipine 5 MG tablet Commonly known as: NORVASC TAKE 1 TABLET (5 MG TOTAL) BY MOUTH DAILY.   amphetamine-dextroamphetamine 20 MG tablet Commonly known as: ADDERALL Take 1 tablet (20 mg total) by mouth 2 (two) times daily.   clonazePAM 1 MG tablet Commonly known as: KLONOPIN Take 1 tablet (1 mg total) by mouth 2 (two) times daily as needed for anxiety. TAKE  1 mg  BY MOUTH TWICE DAILY AS NEEDED   finasteride 5 MG tablet Commonly known as: PROSCAR TAKE 1 TABLET (5 MG TOTAL) BY MOUTH DAILY.   lisinopril 20 MG tablet Commonly known as: ZESTRIL  TAKE 1 TABLET BY MOUTH EVERY DAY   predniSONE 5 MG tablet Commonly known as: DELTASONE Take 5 mg by mouth every morning.   tamsulosin 0.4 MG Caps capsule Commonly known as: FLOMAX TAKE 1 CAPSULE BY MOUTH EVERY DAY        Discharge Exam: Filed Weights   11/15/21 2150  Weight: 89.8 kg   Patient left AGAINST MEDICAL ADVICE in the morning before I even got a chance to see him  Condition at discharge: stable  The results of significant diagnostics from this hospitalization (including imaging, microbiology, ancillary and laboratory) are listed below for reference.   Imaging Studies: DG Chest 2 View  Result Date: 11/15/2021 CLINICAL DATA:  Diarrhea, weakness,  chills, and body ache. Evaluate for pneumonia. EXAM: CHEST - 2 VIEW COMPARISON:  05/24/2017, 09/20/2008. FINDINGS: The heart size and mediastinal contours are within normal limits. Both lungs are clear. Mild degenerative changes in the thoracic spine. An old compression deformity is noted at T6. No acute osseous abnormality. IMPRESSION: No active cardiopulmonary disease. Electronically Signed   By: Brett Fairy M.D.   On: 11/15/2021 22:28    Microbiology: Results for orders placed or performed during the hospital encounter of 11/15/21  Resp Panel by RT-PCR (Flu A&B, Covid) Anterior Nasal Swab     Status: None   Collection Time: 11/15/21 11:58 PM   Specimen: Anterior Nasal Swab  Result Value Ref Range Status   SARS Coronavirus 2 by RT PCR NEGATIVE NEGATIVE Final    Comment: (NOTE) SARS-CoV-2 target nucleic acids are NOT DETECTED.  The SARS-CoV-2 RNA is generally detectable in upper respiratory specimens during the acute phase of infection. The lowest concentration of SARS-CoV-2 viral copies this assay can detect is 138 copies/mL. A negative result does not preclude SARS-Cov-2 infection and should not be used as the sole basis for treatment or other patient management decisions. A negative result may occur with  improper specimen collection/handling, submission of specimen other than nasopharyngeal swab, presence of viral mutation(s) within the areas targeted by this assay, and inadequate number of viral copies(<138 copies/mL). A negative result must be combined with clinical observations, patient history, and epidemiological information. The expected result is Negative.  Fact Sheet for Patients:  EntrepreneurPulse.com.au  Fact Sheet for Healthcare Providers:  IncredibleEmployment.be  This test is no t yet approved or cleared by the [place] FDA and  has been authorized for detection and/or diagnosis of SARS-CoV-2 by FDA under an Emergency Use  Authorization (EUA). This EUA will remain  in effect (meaning this test can be used) for the duration of the COVID-19 declaration under Section 564(b)(1) of the Act, 21 U.S.C.section 360bbb-3(b)(1), unless the authorization is terminated  or revoked sooner.       Influenza A by PCR NEGATIVE NEGATIVE Final   Influenza B by PCR NEGATIVE NEGATIVE Final    Comment: (NOTE) The Xpert Xpress SARS-CoV-2/FLU/RSV plus assay is intended as an aid in the diagnosis of influenza from Nasopharyngeal swab specimens and should not be used as a sole basis for treatment. Nasal washings and aspirates are unacceptable for Xpert Xpress SARS-CoV-2/FLU/RSV testing.  Fact Sheet for Patients: EntrepreneurPulse.com.au  Fact Sheet for Healthcare Providers: IncredibleEmployment.be  This test is not yet approved or cleared by the [place] FDA and has been authorized for detection and/or diagnosis of SARS-CoV-2 by FDA under an Emergency Use Authorization (EUA). This EUA will remain in effect (meaning this test can be used) for the duration of the COVID-19 declaration under Section 564(b)(1)  of the Act, 21 U.S.C. section 360bbb-3(b)(1), unless the authorization is terminated or revoked.  Performed at [facility], [place] [address] [place], [place] [address]   C Difficile Quick Screen w PCR reflex     Status: None   Collection Time: 11/16/21  2:11 AM   Specimen: Urine, Clean Catch; Stool  Result Value Ref Range Status   C Diff antigen NEGATIVE NEGATIVE Final   C Diff toxin NEGATIVE NEGATIVE Final   C Diff interpretation No C. difficile detected.  Final    Comment: Performed at [facility], [place] [address] [place], [place] [address]    Labs: CBC: Recent Labs  Lab 11/15/21 2209  WBC 12.5*  NEUTROABS 10.7*  HGB 15.4  HCT 45.4  MCV 88.2  PLT 580   Basic Metabolic Panel: Recent Labs  Lab 11/15/21 2209  NA 136  K 3.1*   CL 105  CO2 18*  GLUCOSE 136*  BUN 17  CREATININE 0.85  CALCIUM 9.3  MG 1.9   Liver Function Tests: Recent Labs  Lab 11/15/21 2209  AST 15  ALT 13  ALKPHOS 58  BILITOT 0.5  PROT 7.9  ALBUMIN 4.3   CBG: No results for input(s): "GLUCAP" in the last 168 hours.  Discharge time spent: less than 30 minutes.  Signed: Mercy Riding, MD [facility] 11/16/2021

## 2021-11-16 NOTE — ED Notes (Signed)
Pt informed a urine sample is needed but states he does not have to urinate at this time. Pt given a urinal and told to use call bell when he has to urinate.

## 2021-11-16 NOTE — ED Notes (Signed)
Verified pt's DNR status with Dr. Bridgett Larsson.

## 2021-11-16 NOTE — ED Notes (Signed)
Pt ambulated alongside this RN in hallway. Moderately unsteady on feet with feeling lightheaded.

## 2021-11-16 NOTE — Assessment & Plan Note (Signed)
Continue Norvasc 5 mg daily.  Hold lisinopril 20 mg daily due to his volume depletion.

## 2021-11-16 NOTE — Assessment & Plan Note (Signed)
Chronic.  Hold his Adderall for now.
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
## 2021-11-16 NOTE — ED Notes (Signed)
Patient declined VS upon discharge.

## 2021-11-16 NOTE — Assessment & Plan Note (Signed)
Chronic.  Patient declines nicotine patch.

## 2021-11-16 NOTE — Subjective & Objective (Signed)
CC: diarrhea HPI: 61 year old white male history of prostate cancer, tobacco abuse, hypertension, prior history of stroke presents to the ER today with 3 to 4-day history of acute diarrhea.  He has had about 30 episodes of diarrhea today.  He continues to have diarrhea.  Denies any fever but has had chills.  Denies any ill contacts.  No one in his house has diarrhea.  Patient is on chronic prednisone 5 mg a day for his chronic back pain.  He has been without prednisone for 3 days due to his diarrhea.  He has had some nausea.  He describes his diarrhea as green and liquid in color.  Denies any blood in it.  On arrival temp 98.9 heart rate 115 blood pressure 147/93.  C. difficile test was negative.  Sodium 136, Tesson 3.1, bicarb 18, BUN of 17, creatinine 0.85  White count 12.5, hemoglobin 15.4, platelets 183  Magnesium 1.9  Chest x-ray negative.  Patient was given 3 L of IV fluids but is still felt dizzy when trying to ambulate.  [facility] hospitalist contacted for admission.

## 2021-11-16 NOTE — Assessment & Plan Note (Signed)
Stable.  Continue his Zytiga.

## 2021-11-16 NOTE — Assessment & Plan Note (Signed)
Verified DNR/DNI status with pt. Witnessed by [employer].

## 2021-11-18 ENCOUNTER — Telehealth: Payer: Self-pay | Admitting: Family Medicine

## 2021-11-18 NOTE — Telephone Encounter (Signed)
Pt called and stated he was admitted in the [facility]. last wk and was seen for salmonella. He decided to leave on Fri however wasn't given any antibiotic. Pt was wondering if Dr can send in a Rx for antibiotic. Pt sched an appt for this Wed. (11-20-21).  Please advise.

## 2021-11-18 NOTE — Telephone Encounter (Signed)
Please advise 

## 2021-11-18 NOTE — Telephone Encounter (Signed)
We will discuss this at his OV

## 2021-11-19 LAB — GASTROINTESTINAL PANEL BY PCR, STOOL (REPLACES STOOL CULTURE)

## 2021-11-19 NOTE — Telephone Encounter (Signed)
Called patient, message given.

## 2021-11-20 ENCOUNTER — Ambulatory Visit (INDEPENDENT_AMBULATORY_CARE_PROVIDER_SITE_OTHER): Payer: BC Managed Care – PPO | Admitting: Family Medicine

## 2021-11-20 ENCOUNTER — Encounter: Payer: Self-pay | Admitting: Family Medicine

## 2021-11-20 VITALS — BP 118/80 | HR 76 | Temp 98.8°F | Wt 197.0 lb

## 2021-11-20 DIAGNOSIS — A020 Salmonella enteritis: Secondary | ICD-10-CM

## 2021-11-20 DIAGNOSIS — H9313 Tinnitus, bilateral: Secondary | ICD-10-CM | POA: Diagnosis not present

## 2021-11-20 MED ORDER — AMPHETAMINE-DEXTROAMPHETAMINE 20 MG PO TABS: 20.0000 mg | ORAL_TABLET | Freq: Two times a day (BID) | ORAL | 0 refills | Status: DC

## 2021-11-20 NOTE — Progress Notes (Signed)
   Subjective:    Patient ID: Riley Hartman, male    DOB: [birth year], 61 y.o.   MRN: [id number]  HPI Here for several issues. First he was seen in the ED on 11-15-21 when he presented with extreme weakness and 24 hours of watery diarrhea. No fever, no nausea or vomiting, and no abdominal pain. He was given IV fluids and he quickly felt better. His WBC was slightly elevated to 12.5 but otherwise the initial testing was all negative. After he felt better he wound up leaving AMA and he went home. Afterwards the GI PCR infection panel returned positive for Salmonella. Since then he has felt fine with no more diarrhea. He is eating regularly and passing normal stools daily. After reviewing his diet preceeding this visit, he remembers eating some over easy eggs in a restaurant that morning which he thought were undercooked. His wife has felt fine but she did not eat the eggs. The second issue is ringing in both his ears. This started a year ago, but it has become louder and more annoying, and it is constant. He denies any hearing loss or ear pain or dizziness.    Review of Systems  Constitutional: Negative.   HENT:  Positive for tinnitus. Negative for congestion, ear pain, hearing loss, postnasal drip and sinus pressure.   Eyes: Negative.   Respiratory: Negative.    Cardiovascular: Negative.   Gastrointestinal: Negative.        Objective:   Physical Exam Constitutional:      Appearance: Normal appearance. He is not ill-appearing.  HENT:     Right Ear: Tympanic membrane, ear canal and external ear normal.     Left Ear: Tympanic membrane, ear canal and external ear normal.     Nose: Nose normal.     Mouth/Throat:     Pharynx: Oropharynx is clear.  Eyes:     Conjunctiva/sclera: Conjunctivae normal.  Cardiovascular:     Rate and Rhythm: Normal rate and regular rhythm.     Pulses: Normal pulses.     Heart sounds: Normal heart sounds.  Pulmonary:     Effort: Pulmonary effort is normal.      Breath sounds: Normal breath sounds.  Abdominal:     General: Abdomen is flat. Bowel sounds are normal. There is no distension.     Palpations: Abdomen is soft. There is no mass.     Tenderness: There is no abdominal tenderness. There is no guarding or rebound.     Hernia: No hernia is present.  Neurological:     Mental Status: He is alert.           Assessment & Plan:  He is recovering from a Salmonella infection. It is too late now for antibiotics to help him. He can return as needed. For the tinnitus, we will refer him to ENT to evaluate. Alysia Penna, MD

## 2021-12-23 ENCOUNTER — Telehealth: Payer: Self-pay | Admitting: Family Medicine

## 2021-12-23 NOTE — Telephone Encounter (Signed)
Pt called to request a refill of the following: amphetamine-dextroamphetamine (ADDERALL) 20 MG tablet  LOV:  11/20/21 = HFU  Please advise.  [id number] [place], [place] - [address] Phone:  [phone] Fax:  [phone]

## 2021-12-23 NOTE — Telephone Encounter (Signed)
Called pharmacy refill currently on file for Adderall '20mg'$ .   Pharmacy to fill after 2pm, then will notify patient when ready for pickup.

## 2022-01-14 ENCOUNTER — Other Ambulatory Visit: Payer: Self-pay | Admitting: Family Medicine

## 2022-01-21 DIAGNOSIS — H9042 Sensorineural hearing loss, unilateral, left ear, with unrestricted hearing on the contralateral side: Secondary | ICD-10-CM | POA: Diagnosis not present

## 2022-01-21 DIAGNOSIS — H9313 Tinnitus, bilateral: Secondary | ICD-10-CM | POA: Diagnosis not present

## 2022-01-21 DIAGNOSIS — H9192 Unspecified hearing loss, left ear: Secondary | ICD-10-CM | POA: Diagnosis not present

## 2022-01-25 ENCOUNTER — Other Ambulatory Visit: Payer: Self-pay | Admitting: Family Medicine

## 2022-02-05 DIAGNOSIS — Z8673 Personal history of transient ischemic attack (TIA), and cerebral infarction without residual deficits: Secondary | ICD-10-CM | POA: Diagnosis not present

## 2022-02-05 DIAGNOSIS — F17210 Nicotine dependence, cigarettes, uncomplicated: Secondary | ICD-10-CM | POA: Diagnosis not present

## 2022-02-05 DIAGNOSIS — R591 Generalized enlarged lymph nodes: Secondary | ICD-10-CM | POA: Diagnosis not present

## 2022-02-05 DIAGNOSIS — R413 Other amnesia: Secondary | ICD-10-CM | POA: Diagnosis not present

## 2022-02-05 DIAGNOSIS — Z7952 Long term (current) use of systemic steroids: Secondary | ICD-10-CM | POA: Diagnosis not present

## 2022-02-05 DIAGNOSIS — Z79818 Long term (current) use of other agents affecting estrogen receptors and estrogen levels: Secondary | ICD-10-CM | POA: Diagnosis not present

## 2022-02-05 DIAGNOSIS — Z79899 Other long term (current) drug therapy: Secondary | ICD-10-CM | POA: Diagnosis not present

## 2022-02-05 DIAGNOSIS — R5383 Other fatigue: Secondary | ICD-10-CM | POA: Diagnosis not present

## 2022-02-05 DIAGNOSIS — R208 Other disturbances of skin sensation: Secondary | ICD-10-CM | POA: Diagnosis not present

## 2022-02-05 DIAGNOSIS — I69398 Other sequelae of cerebral infarction: Secondary | ICD-10-CM | POA: Diagnosis not present

## 2022-02-05 DIAGNOSIS — C61 Malignant neoplasm of prostate: Secondary | ICD-10-CM | POA: Diagnosis not present

## 2022-02-05 DIAGNOSIS — Z191 Hormone sensitive malignancy status: Secondary | ICD-10-CM | POA: Diagnosis not present

## 2022-02-05 DIAGNOSIS — I10 Essential (primary) hypertension: Secondary | ICD-10-CM | POA: Diagnosis not present

## 2022-02-07 ENCOUNTER — Encounter (HOSPITAL_COMMUNITY): Payer: Self-pay

## 2022-02-07 ENCOUNTER — Emergency Department (HOSPITAL_COMMUNITY)
Admission: EM | Admit: 2022-02-07 | Discharge: 2022-02-07 | Disposition: A | Discharge status: Home / Self Care | Payer: BC Managed Care – PPO | Attending: Emergency Medicine | Admitting: Emergency Medicine

## 2022-02-07 ENCOUNTER — Emergency Department (HOSPITAL_COMMUNITY): Payer: BC Managed Care – PPO

## 2022-02-07 DIAGNOSIS — Z8546 Personal history of malignant neoplasm of prostate: Secondary | ICD-10-CM | POA: Diagnosis not present

## 2022-02-07 DIAGNOSIS — N1330 Unspecified hydronephrosis: Secondary | ICD-10-CM | POA: Diagnosis not present

## 2022-02-07 DIAGNOSIS — I2510 Atherosclerotic heart disease of native coronary artery without angina pectoris: Secondary | ICD-10-CM | POA: Diagnosis not present

## 2022-02-07 DIAGNOSIS — R1031 Right lower quadrant pain: Secondary | ICD-10-CM | POA: Diagnosis not present

## 2022-02-07 DIAGNOSIS — R109 Unspecified abdominal pain: Secondary | ICD-10-CM

## 2022-02-07 DIAGNOSIS — R1084 Generalized abdominal pain: Secondary | ICD-10-CM | POA: Diagnosis not present

## 2022-02-07 DIAGNOSIS — I700 Atherosclerosis of aorta: Secondary | ICD-10-CM | POA: Diagnosis not present

## 2022-02-07 DIAGNOSIS — D649 Anemia, unspecified: Secondary | ICD-10-CM | POA: Diagnosis not present

## 2022-02-07 DIAGNOSIS — N200 Calculus of kidney: Secondary | ICD-10-CM

## 2022-02-07 DIAGNOSIS — N132 Hydronephrosis with renal and ureteral calculous obstruction: Secondary | ICD-10-CM | POA: Insufficient documentation

## 2022-02-07 LAB — COMPREHENSIVE METABOLIC PANEL
ALT: 12 U/L (ref 0–44)
AST: 13 U/L — ABNORMAL LOW (ref 15–41)
Albumin: 4.1 g/dL (ref 3.5–5.0)
Alkaline Phosphatase: 71 U/L (ref 38–126)
Anion gap: 8 (ref 5–15)
BUN: 23 mg/dL (ref 8–23)
CO2: 25 mmol/L (ref 22–32)
Calcium: 9.3 mg/dL (ref 8.9–10.3)
Chloride: 110 mmol/L (ref 98–111)
Creatinine, Ser: 0.85 mg/dL (ref 0.61–1.24)
GFR, Estimated: >60 mL/min (ref >60)
Glucose, Bld: 118 mg/dL — ABNORMAL HIGH (ref 70–99)
Potassium: 3.6 mmol/L (ref 3.5–5.1)
Sodium: 143 mmol/L (ref 135–145)
Total Bilirubin: 0.3 mg/dL (ref 0.3–1.2)
Total Protein: 7.2 g/dL (ref 6.5–8.1)

## 2022-02-07 LAB — URINALYSIS, ROUTINE W REFLEX MICROSCOPIC
Bilirubin Urine: NEGATIVE
Glucose, UA: NEGATIVE mg/dL
Ketones, ur: NEGATIVE mg/dL
Leukocytes,Ua: NEGATIVE
Nitrite: NEGATIVE
Protein, ur: NEGATIVE mg/dL
RBC / HPF: >50 RBC/hpf — ABNORMAL HIGH (ref 0–5)
Specific Gravity, Urine: 1.028 (ref 1.005–1.030)
pH: 6 (ref 5.0–8.0)

## 2022-02-07 LAB — CBC WITH DIFFERENTIAL/PLATELET
Abs Immature Granulocytes: 0.03 10*3/uL (ref 0.00–0.07)
Basophils Absolute: 0 10*3/uL (ref 0.0–0.1)
Basophils Relative: 1 %
Eosinophils Absolute: 0.2 10*3/uL (ref 0.0–0.5)
Eosinophils Relative: 2 %
HCT: 38.7 % — ABNORMAL LOW (ref 39.0–52.0)
Hemoglobin: 12.6 g/dL — ABNORMAL LOW (ref 13.0–17.0)
Immature Granulocytes: 0 %
Lymphocytes Relative: 12 %
Lymphs Abs: 1 10*3/uL (ref 0.7–4.0)
MCH: 29 pg (ref 26.0–34.0)
MCHC: 32.6 g/dL (ref 30.0–36.0)
MCV: 89 fL (ref 80.0–100.0)
Monocytes Absolute: 0.5 10*3/uL (ref 0.1–1.0)
Monocytes Relative: 6 %
Neutro Abs: 6.3 10*3/uL (ref 1.7–7.7)
Neutrophils Relative %: 79 %
Platelets: 188 10*3/uL (ref 150–400)
RBC: 4.35 MIL/uL (ref 4.22–5.81)
RDW: 13.4 % (ref 11.5–15.5)
WBC: 8.1 10*3/uL (ref 4.0–10.5)
nRBC: 0 % (ref 0.0–0.2)

## 2022-02-07 LAB — LIPASE, BLOOD: Lipase: 26 U/L (ref 11–51)

## 2022-02-07 MED ORDER — SODIUM CHLORIDE (PF) 0.9 % IJ SOLN
INTRAMUSCULAR | Status: AC
  Filled 2022-02-07: qty 50

## 2022-02-07 MED ORDER — ONDANSETRON 4 MG PO TBDP: 4.0000 mg | ORAL_TABLET | Freq: Three times a day (TID) | ORAL | 0 refills | Status: DC | PRN

## 2022-02-07 MED ORDER — LACTATED RINGERS IV BOLUS: 1000.0000 mL | Freq: Once | INTRAVENOUS | Status: DC

## 2022-02-07 MED ORDER — KETOROLAC TROMETHAMINE 30 MG/ML IJ SOLN
15.0000 mg | Freq: Once | INTRAMUSCULAR | Status: AC
  Administered 2022-02-07: 15 mg via INTRAVENOUS
  Filled 2022-02-07: qty 1

## 2022-02-07 MED ORDER — OXYCODONE HCL 5 MG PO TABS: 5.0000 mg | ORAL_TABLET | Freq: Four times a day (QID) | ORAL | 0 refills | Status: DC | PRN

## 2022-02-07 MED ORDER — ONDANSETRON HCL 4 MG/2ML IJ SOLN
4.0000 mg | Freq: Once | INTRAMUSCULAR | Status: AC
  Administered 2022-02-07: 4 mg via INTRAVENOUS
  Filled 2022-02-07: qty 2

## 2022-02-07 MED ORDER — HYDROMORPHONE HCL 1 MG/ML IJ SOLN
1.0000 mg | Freq: Once | INTRAMUSCULAR | Status: AC
  Administered 2022-02-07: 1 mg via INTRAVENOUS
  Filled 2022-02-07: qty 1

## 2022-02-07 MED ORDER — IOHEXOL 300 MG/ML  SOLN
100.0000 mL | Freq: Once | INTRAMUSCULAR | Status: AC | PRN
  Administered 2022-02-07: 100 mL via INTRAVENOUS

## 2022-02-07 MED ORDER — TAMSULOSIN HCL 0.4 MG PO CAPS
0.4000 mg | ORAL_CAPSULE | Freq: Once | ORAL | Status: AC
Start: 2022-02-07 — End: 2022-02-07
  Administered 2022-02-07: 0.4 mg via ORAL
  Filled 2022-02-07: qty 1

## 2022-02-07 NOTE — ED Triage Notes (Signed)
Pt arrived via EMS, c/o RLQ abd pain as well as right flank pain. Hx of kidney stones.

## 2022-02-07 NOTE — ED Provider Notes (Signed)
[facility] DEPT Provider Note   CSN: [id number] Arrival date & time: 02/07/22  1408     History  No chief complaint on file.   Riley Hartman is a 61 y.o. male.  With PMH of CAD, CVA, prostate cancer who presents with sudden onset right sided abdominal pain.  Patient says he was at the grocery store with his wife sitting in a motorized chair because he was having knee issues when he began to have some discomfort in his right back which then proceeded to localize to his right abdominal region.  He feels like he is being stabbed in the abdomen.  It is better lying down and worse when standing up or leaning forward.  Is been associate with nausea but no vomiting.  He has had no recent fevers or illness.  Denies any chest pain or shortness of breath.  He denies any urinary symptoms, no pain with urination, no hematuria, no polyuria.  He has had history of kidney stones but did not feel like this was similar.  He denies any radiation down his legs or weakness or numbness or tingling.  HPI     Home Medications Prior to Admission medications   Medication Sig Start Date End Date Taking? Authorizing Provider  ondansetron (ZOFRAN-ODT) 4 MG disintegrating tablet Take 1 tablet (4 mg total) by mouth every 8 (eight) hours as needed for nausea or vomiting. 02/07/22  Yes Elgie Congo, MD  oxyCODONE (ROXICODONE) 5 MG immediate release tablet Take 1 tablet (5 mg total) by mouth every 6 (six) hours as needed for up to 10 doses for severe pain. 02/07/22  Yes Elgie Congo, MD  abiraterone acetate (ZYTIGA) 250 MG tablet Take 4 tablets (1,000 mg total) by mouth daily. Take on an empty stomach 1 hour before or 2 hours after a meal Patient taking differently: Take 1,000 mg by mouth daily before breakfast. Take on an empty stomach 1 hour before or 2 hours after a meal 10/31/20   Laurey Morale, MD  amLODipine (NORVASC) 5 MG tablet TAKE 1 TABLET (5 MG TOTAL) BY MOUTH  DAILY. 09/23/21   Laurey Morale, MD  amphetamine-dextroamphetamine (ADDERALL) 20 MG tablet Take 1 tablet (20 mg total) by mouth 2 (two) times daily. 01/20/22 02/19/22  Laurey Morale, MD  clonazePAM (KLONOPIN) 1 MG tablet Take 1 tablet (1 mg total) by mouth 2 (two) times daily as needed for anxiety. TAKE  1 mg  BY MOUTH TWICE DAILY AS NEEDED Patient taking differently: Take 1 mg by mouth 2 (two) times daily as needed for anxiety. 06/19/21   Laurey Morale, MD  finasteride (PROSCAR) 5 MG tablet TAKE 1 TABLET (5 MG TOTAL) BY MOUTH DAILY. Patient taking differently: Take 5 mg by mouth daily. 10/21/21   Laurey Morale, MD  lisinopril (ZESTRIL) 20 MG tablet TAKE 1 TABLET BY MOUTH EVERY DAY 01/27/22   Laurey Morale, MD  predniSONE (DELTASONE) 5 MG tablet Take 5 mg by mouth every morning. 10/19/20   [provider]  tamsulosin (FLOMAX) 0.4 MG CAPS capsule TAKE 1 CAPSULE BY MOUTH EVERY DAY 01/14/22   Laurey Morale, MD      Allergies    Gadolinium derivatives    Review of Systems   Review of Systems  Physical Exam Updated Vital Signs BP 132/83   Pulse 74   Temp 97.7 F (36.5 C) (Oral)   Resp 20   SpO2 96%  Physical Exam Constitutional: Alert  and oriented.  Uncomfortable male holding onto right side of abdomen but no acute distress Eyes: Conjunctivae are normal. ENT      Head: Normocephalic and atraumatic.      Nose: No congestion.      Mouth/Throat: Mucous membranes are moist.      Neck: No stridor. Cardiovascular: S1, S2,  Normal and symmetric distal pulses are present in all extremities.Warm and well perfused. Respiratory: Normal respiratory effort. Breath sounds are normal.  O2 sat 100 on RA Gastrointestinal: Soft and nondistended with right lateral middle abdominal tenderness without rebound or guarding, no CVA tenderness Musculoskeletal: Normal range of motion in all extremities. No pitting edema of the lower extremities Neurologic: Normal speech and language.  Sensation grossly  intact, moving all extremities equally.  No gross focal neurologic deficits are appreciated. Skin: Skin is warm, dry and intact. No rash noted. Psychiatric: Mood and affect are normal. Speech and behavior are normal.  ED Results / Procedures / Treatments   Labs (all labs ordered are listed, but only abnormal results are displayed) Labs Reviewed  COMPREHENSIVE METABOLIC PANEL - Abnormal; Notable for the following components:      Result Value   Glucose, Bld 118 (*)    AST 13 (*)    All other components within normal limits  CBC WITH DIFFERENTIAL/PLATELET - Abnormal; Notable for the following components:   Hemoglobin 12.6 (*)    HCT 38.7 (*)    All other components within normal limits  LIPASE, BLOOD  URINALYSIS, ROUTINE W REFLEX MICROSCOPIC    EKG None  Radiology CT ABDOMEN PELVIS W CONTRAST  Result Date: 02/07/2022 CLINICAL DATA:  Right lower quadrant and right flank pain acute onset EXAM: CT ABDOMEN AND PELVIS WITH CONTRAST TECHNIQUE: Multidetector CT imaging of the abdomen and pelvis was performed using the standard protocol following bolus administration of intravenous contrast. RADIATION DOSE REDUCTION: This exam was performed according to the departmental dose-optimization program which includes automated exposure control, adjustment of the mA and/or kV according to patient size and/or use of iterative reconstruction technique. CONTRAST:  155m OMNIPAQUE IOHEXOL 300 MG/ML  SOLN COMPARISON:  09/08/2014 FINDINGS: Lower chest: Dependent lower lobe hypoventilatory changes. No acute pleural or parenchymal lung disease. Hepatobiliary: No focal liver abnormality is seen. No gallstones, gallbladder wall thickening, or biliary dilatation. Pancreas: Unremarkable. No pancreatic ductal dilatation or surrounding inflammatory changes. Spleen: Normal in size without focal abnormality. Adrenals/Urinary Tract: There is an obstructing 4 mm right UPJ calculus reference image 41/2, with mild right  hydronephrosis and Peri ureteral fat stranding. There are 2 nonobstructing left renal calculi, largest measuring 7 mm. No left-sided obstruction. The adrenals and bladder are unremarkable. Stomach/Bowel: No bowel obstruction or ileus. Normal appendix right lower quadrant. No bowel wall thickening or inflammatory change. Vascular/Lymphatic: Aortic atherosclerosis. No enlarged abdominal or pelvic lymph nodes. Reproductive: The prostate is not enlarged. Other: No free fluid or free intraperitoneal gas. No abdominal wall hernia. Musculoskeletal: No acute or destructive bony lesions. Reconstructed images demonstrate no additional findings. IMPRESSION: 1. Obstructing 4 mm right UPJ calculus, with mild right-sided obstructive uropathy. 2. Nonobstructing left renal calculi. 3. Normal appendix. 4.  Aortic Atherosclerosis (ICD10-I70.0). Electronically Signed   By: MRanda NgoM.D.   On: 02/07/2022 18:19    Procedures Procedures  Remain on constant cardiac monitoring which I personally reviewed, remained in normal sinus rhythm.  Medications Ordered in ED Medications  sodium chloride (PF) 0.9 % injection (has no administration in time range)  ketorolac (TORADOL) 30 MG/ML injection  15 mg (has no administration in time range)  tamsulosin (FLOMAX) capsule 0.4 mg (has no administration in time range)  HYDROmorphone (DILAUDID) injection 1 mg (1 mg Intravenous Given 02/07/22 1558)  ondansetron (ZOFRAN) injection 4 mg (4 mg Intravenous Given 02/07/22 1558)  iohexol (OMNIPAQUE) 300 MG/ML solution 100 mL (100 mLs Intravenous Contrast Given 02/07/22 1758)    ED Course/ Medical Decision Making/ A&P                           Medical Decision Making MARCELIS WISSNER is a 61 y.o. male.  With PMH of CAD, CVA, prostate cancer who presents with sudden onset right sided abdominal pain.   Regarding the patient's flank pain, differential includes but is not limited to nephrolithiasis, pyelonephritis, MSK pain, referred pain  from cholelithiasis, abnormal sources such as renal artery dissection. Less likely pulmonary source such as pneumonia or PE with no cardiopulmonary complaints, no hypoxia, and no increased work of breathing. Less likely aortic dissection with nontoxic appearance, no cardiopulmonary complaints, and no pulse or neurologic deficits.  Labs obtained and reviewed which showed normal white blood cell count 8.1.  Mild anemia 12.6 unclear etiology with no complaints of bleeding.  Glucose 118 with creatinine 0.85 within normal limits.  No transaminitis and normal total bilirubin.  Lipase within normal limits no concern for pancreatitis.  CTAP with IV contrast which I personally reviewed showed right-sided kidney stone with hydronephrosis.  Radiologist read as obstructing 4 mm right UPJ stone with mild right-sided obstructive uropathy.  No appendicitis.  Patient's pain much more under control after dose of IV Dilaudid.  He has no AKI.  UA is pending to ensure no associated infection.  Signed out to Dr. Zenia Resides pending UA and if clean, plan for patient to follow-up with urology in outpatient setting, continue Flomax and given as needed oxycodone and Zofran for symptom control.   Amount and/or Complexity of Data Reviewed Labs: ordered. Radiology: ordered.  Risk Prescription drug management.    Final Clinical Impression(s) / ED Diagnoses Final diagnoses:  Abdominal pain, unspecified abdominal location  Nephrolithiasis    Rx / DC Orders ED Discharge Orders          Ordered    oxyCODONE (ROXICODONE) 5 MG immediate release tablet  Every 6 hours PRN        02/07/22 1804    ondansetron (ZOFRAN-ODT) 4 MG disintegrating tablet  Every 8 hours PRN        02/07/22 1804    Ambulatory referral to Urology        02/07/22 [place], [place], MD 02/07/22 1825

## 2022-02-07 NOTE — ED Provider Notes (Addendum)
  Urinalysis noted and no evidence of infection.  Will discharge home [name], MD 02/07/22 Riley Hartman    [name], MD 02/07/22 1944

## 2022-02-07 NOTE — Discharge Instructions (Addendum)
Your pain was due to a 4 mm right-sided kidney stone.  Make an appointment with urology.  Drink plenty of fluids.  You can take the oxycodone as needed for severe pain but you can also take Tylenol and ibuprofen for pain control.  Take the Zofran as needed for nausea and vomiting.  Continue taking your Flomax.  Come back if any severe worsening pain, inability to urinate, high fevers, or any other symptoms concerning to you.

## 2022-02-11 DIAGNOSIS — C61 Malignant neoplasm of prostate: Secondary | ICD-10-CM | POA: Diagnosis not present

## 2022-02-11 DIAGNOSIS — N201 Calculus of ureter: Secondary | ICD-10-CM | POA: Diagnosis not present

## 2022-02-11 DIAGNOSIS — N1330 Unspecified hydronephrosis: Secondary | ICD-10-CM | POA: Diagnosis not present

## 2022-02-11 DIAGNOSIS — N200 Calculus of kidney: Secondary | ICD-10-CM | POA: Diagnosis not present

## 2022-02-13 ENCOUNTER — Telehealth: Payer: Self-pay | Admitting: *Deleted

## 2022-02-13 ENCOUNTER — Encounter: Payer: Self-pay | Admitting: *Deleted

## 2022-02-13 NOTE — Patient Outreach (Signed)
  Care Coordination   Initial Visit Note   02/13/2022 Name: Riley Hartman MRN: [id number] DOB: 1960-12-08  Riley Hartman is a 61 y.o. year old male who sees Riley Morale, MD for primary care. I spoke with  Riley Hartman by phone today.  What matters to the patients health and wellness today?  No needs    Goals Addressed               This Visit's Progress     COMPLETED: No needs (pt-stated)        Care Coordination Interventions: Reviewed medications with patient and discussed adherence to all medications Reviewed scheduled/upcoming provider appointments including pending appointments Screening for signs and symptoms of depression related to chronic disease state  Assessed social determinant of health barriers          SDOH assessments and interventions completed:  Yes  SDOH Interventions Today    Flowsheet Row Most Recent Value  SDOH Interventions   Food Insecurity Interventions Intervention Not Indicated  Housing Interventions Intervention Not Indicated  Transportation Interventions Intervention Not Indicated  Utilities Interventions Intervention Not Indicated        Care Coordination Interventions Activated:  Yes  Care Coordination Interventions:  Yes, provided   Follow up plan: No further intervention required.   Encounter Outcome:  Pt. Visit Completed   Raina Mina, RN Care Management Coordinator [place] Office [phone]

## 2022-02-13 NOTE — Patient Instructions (Signed)
Visit Information  Thank you for taking time to visit with me today. Please don't hesitate to contact me if I can be of assistance to you.   Following are the goals we discussed today:   Goals Addressed               This Visit's Progress     COMPLETED: No needs (pt-stated)        Care Coordination Interventions: Reviewed medications with patient and discussed adherence to all medications Reviewed scheduled/upcoming provider appointments including pending appointments Screening for signs and symptoms of depression related to chronic disease state  Assessed social determinant of health barriers         Please call the care guide team at [phone] if you need to cancel or reschedule your appointment.   If you are experiencing a Mental Health or [place] or need someone to talk to, please call the Suicide and Crisis Lifeline: 988  Patient verbalizes understanding of instructions and care plan provided today and agrees to view in [place]. Active MyChart status and patient understanding of how to access instructions and care plan via MyChart confirmed with patient.     No further follow up required: No needs    Raina Mina, RN Care Management Coordinator [place] Office [phone]

## 2022-02-25 ENCOUNTER — Telehealth: Payer: Self-pay | Admitting: Family Medicine

## 2022-02-25 MED ORDER — AMPHETAMINE-DEXTROAMPHETAMINE 20 MG PO TABS: 20.0000 mg | ORAL_TABLET | Freq: Two times a day (BID) | ORAL | 0 refills | Status: DC

## 2022-02-25 NOTE — Telephone Encounter (Addendum)
Last OV-11/20/21 Last refill-01/20/22--60 tabs, 0 refills  No future OV scheduled.

## 2022-02-25 NOTE — Telephone Encounter (Signed)
Done

## 2022-02-25 NOTE — Telephone Encounter (Signed)
Refill amphetamine-dextroamphetamine (ADDERALL) 20 MG tablet (Expired   [id number] [place], [place] - [address] Phone: [phone] Fax: [phone]

## 2022-03-10 ENCOUNTER — Other Ambulatory Visit: Payer: Self-pay | Admitting: Family Medicine

## 2022-04-17 ENCOUNTER — Other Ambulatory Visit: Payer: Self-pay | Admitting: Family Medicine

## 2022-04-30 ENCOUNTER — Other Ambulatory Visit: Payer: Self-pay | Admitting: Family Medicine

## 2022-06-04 ENCOUNTER — Telehealth: Payer: Self-pay | Admitting: Family Medicine

## 2022-06-04 MED ORDER — AMPHETAMINE-DEXTROAMPHETAMINE 20 MG PO TABS: 20.0000 mg | ORAL_TABLET | Freq: Two times a day (BID) | ORAL | 0 refills | Status: DC

## 2022-06-04 NOTE — Telephone Encounter (Signed)
Pt called to request a refill of the following:    amphetamine-dextroamphetamine Expired - amphetamine-dextroamphetamine (ADDERALL) 20 MG tablet  Pt states he is almost out.  Pt informed that MD is OOO.  LOV:   11/20/21  Please advise.   [id number] [place] [place] - [address] Phone: [phone] Fax: [phone]

## 2022-06-04 NOTE — Telephone Encounter (Signed)
Refilled once.   Eulas Post MD [facility]

## 2022-06-04 NOTE — Telephone Encounter (Signed)
Dr. Barbie Banner patient.   Can you please assist with a refill?  Last refill- 04/27/22--60 tabs, 0 refills Last OV-11/20/21  Next OV CPE- 07/01/22

## 2022-06-11 DIAGNOSIS — F17210 Nicotine dependence, cigarettes, uncomplicated: Secondary | ICD-10-CM | POA: Diagnosis not present

## 2022-06-11 DIAGNOSIS — Z8673 Personal history of transient ischemic attack (TIA), and cerebral infarction without residual deficits: Secondary | ICD-10-CM | POA: Diagnosis not present

## 2022-06-11 DIAGNOSIS — C61 Malignant neoplasm of prostate: Secondary | ICD-10-CM | POA: Diagnosis not present

## 2022-06-11 DIAGNOSIS — Z91041 Radiographic dye allergy status: Secondary | ICD-10-CM | POA: Diagnosis not present

## 2022-06-11 DIAGNOSIS — I10 Essential (primary) hypertension: Secondary | ICD-10-CM | POA: Diagnosis not present

## 2022-06-11 DIAGNOSIS — C775 Secondary and unspecified malignant neoplasm of intrapelvic lymph nodes: Secondary | ICD-10-CM | POA: Diagnosis not present

## 2022-07-01 ENCOUNTER — Encounter: Payer: Self-pay | Admitting: Family Medicine

## 2022-07-01 ENCOUNTER — Ambulatory Visit (INDEPENDENT_AMBULATORY_CARE_PROVIDER_SITE_OTHER): Payer: BC Managed Care – PPO | Admitting: Family Medicine

## 2022-07-01 VITALS — BP 146/98 | HR 71 | Temp 97.7°F | Ht 72.0 in | Wt 212.0 lb

## 2022-07-01 DIAGNOSIS — Z0000 Encounter for general adult medical examination without abnormal findings: Secondary | ICD-10-CM | POA: Diagnosis not present

## 2022-07-01 LAB — LIPID PANEL
Cholesterol: 205 mg/dL — ABNORMAL HIGH (ref 0–200)
HDL: 39.3 mg/dL (ref >39.00)
NonHDL: 165.46
Total CHOL/HDL Ratio: 5
Triglycerides: 212 mg/dL — ABNORMAL HIGH (ref 0.0–149.0)
VLDL: 42.4 mg/dL — ABNORMAL HIGH (ref 0.0–40.0)

## 2022-07-01 LAB — HEMOGLOBIN A1C: Hgb A1c MFr Bld: 6.1 % (ref 4.6–6.5)

## 2022-07-01 LAB — LDL CHOLESTEROL, DIRECT: Direct LDL: 129 mg/dL

## 2022-07-01 LAB — TSH: TSH: 1.93 u[IU]/mL (ref 0.35–5.50)

## 2022-07-01 MED ORDER — LISINOPRIL 20 MG PO TABS: 20.0000 mg | ORAL_TABLET | Freq: Every day | ORAL | 3 refills | Status: DC

## 2022-07-01 MED ORDER — AMLODIPINE BESYLATE 5 MG PO TABS: 5.0000 mg | ORAL_TABLET | Freq: Every day | ORAL | 3 refills | Status: DC

## 2022-07-01 NOTE — Progress Notes (Signed)
Subjective:    Patient ID: Riley Hartman, male    DOB: 14-Jul-1960, 62 y.o.   MRN: [id number]  HPI Here for a well exam. He is doing well and enjoying his retirement. He was seen in the Oncology clinic a few weeks ago, and his labs revealed a normal CBC, BMET, and liver panel. His PSA was zero. He admits to not taking his BP medications regularly because he forgets sometimes. He did not take them this morning, hence the high BP today.    Review of Systems  Constitutional: Negative.   HENT: Negative.    Eyes: Negative.   Respiratory: Negative.    Cardiovascular: Negative.   Gastrointestinal: Negative.   Genitourinary: Negative.   Musculoskeletal:  Positive for neck pain and neck stiffness.  Skin: Negative.   Neurological: Negative.   Psychiatric/Behavioral: Negative.         Objective:   Physical Exam Constitutional:      General: He is not in acute distress.    Appearance: Normal appearance. He is well-developed. He is not diaphoretic.  HENT:     Head: Normocephalic and atraumatic.     Right Ear: External ear normal.     Left Ear: External ear normal.     Nose: Nose normal.     Mouth/Throat:     Pharynx: No oropharyngeal exudate.  Eyes:     General: No scleral icterus.       Right eye: No discharge.        Left eye: No discharge.     Conjunctiva/sclera: Conjunctivae normal.     Pupils: Pupils are equal, round, and reactive to light.  Neck:     Thyroid: No thyromegaly.     Vascular: No JVD.     Trachea: No tracheal deviation.  Cardiovascular:     Rate and Rhythm: Normal rate and regular rhythm.     Heart sounds: Normal heart sounds. No murmur heard.    No friction rub. No gallop.  Pulmonary:     Effort: Pulmonary effort is normal. No respiratory distress.     Breath sounds: Normal breath sounds. No wheezing or rales.  Chest:     Chest wall: No tenderness.  Abdominal:     General: Bowel sounds are normal. There is no distension.     Palpations: Abdomen is soft.  There is no mass.     Tenderness: There is no abdominal tenderness. There is no guarding or rebound.  Genitourinary:    Penis: Normal. No tenderness.      Testes: Normal.     Prostate: Normal.     Rectum: Normal. Guaiac result negative.  Musculoskeletal:        General: No tenderness. Normal range of motion.     Cervical back: Neck supple.  Lymphadenopathy:     Cervical: No cervical adenopathy.  Skin:    General: Skin is warm and dry.     Coloration: Skin is not pale.     Findings: No erythema or rash.  Neurological:     Mental Status: He is alert and oriented to person, place, and time.     Cranial Nerves: No cranial nerve deficit.     Motor: No abnormal muscle tone.     Coordination: Coordination normal.     Deep Tendon Reflexes: Reflexes are normal and symmetric. Reflexes normal.  Psychiatric:        Behavior: Behavior normal.        Thought Content: Thought content normal.  Judgment: Judgment normal.           Assessment & Plan:  Well exam. We discussed diet and exercise. Get fasting labs for lipids, TSH, and an A1c. I feel his HTN would be well controlled if he took his medications regularly, so we talked about this.  Alysia Penna, MD

## 2022-07-10 ENCOUNTER — Telehealth: Payer: Self-pay | Admitting: Family Medicine

## 2022-07-10 NOTE — Telephone Encounter (Signed)
Prescription Request  07/10/2022  LOV: 07/01/2022  What is the name of the medication or equipment? amphetamine-dextroamphetamine (ADDERALL) 20 MG tablet   Have you contacted your pharmacy to request a refill? No   Which pharmacy would you like this sent to?  [id number] [place], [place] - [address] [place] [address] Phone: [phone] Fax: [phone]    Patient notified that their request is being sent to the clinical staff for review and that they should receive a response within 2 business days.   Please advise at Mobile [phone] (mobile)

## 2022-07-10 NOTE — Telephone Encounter (Signed)
Last CPE-07/01/22 Last refill-06/04/22--60 tabs, 0 refills  No future OV scheduled

## 2022-07-14 MED ORDER — AMPHETAMINE-DEXTROAMPHETAMINE 20 MG PO TABS: 20.0000 mg | ORAL_TABLET | Freq: Two times a day (BID) | ORAL | 0 refills | Status: DC

## 2022-07-14 NOTE — Telephone Encounter (Signed)
Noted  

## 2022-07-14 NOTE — Telephone Encounter (Signed)
Done

## 2022-08-10 ENCOUNTER — Other Ambulatory Visit: Payer: Self-pay | Admitting: Family Medicine

## 2022-09-10 DIAGNOSIS — C61 Malignant neoplasm of prostate: Secondary | ICD-10-CM | POA: Diagnosis not present

## 2022-10-13 ENCOUNTER — Telehealth: Payer: Self-pay | Admitting: Family Medicine

## 2022-10-13 NOTE — Telephone Encounter (Signed)
Prescription Request  10/13/2022  LOV: 07/01/2022  What is the name of the medication or equipment?   Have you contacted your pharmacy to request a refill? amphetamine-dextroamphetamine amphetamine-dextroamphetamine (ADDERALL) 20 MG tablet   Which pharmacy would you like this sent to?  [id number] [place], [place] - [address] [place] [address] Phone: [phone] Fax: [phone]    Patient notified that their request is being sent to the clinical staff for review and that they should receive a response within 2 business days.   Please advise at Mobile [phone] (mobile)

## 2022-10-15 NOTE — Telephone Encounter (Signed)
Pt LOV was on 07/01/22 Last refill was done on 09/13/22 Please advise

## 2022-10-17 ENCOUNTER — Telehealth: Payer: Self-pay

## 2022-10-17 MED ORDER — AMPHETAMINE-DEXTROAMPHETAMINE 20 MG PO TABS: 20.0000 mg | ORAL_TABLET | Freq: Two times a day (BID) | ORAL | 0 refills | Status: DC

## 2022-10-17 NOTE — Telephone Encounter (Signed)
Done

## 2022-10-17 NOTE — Telephone Encounter (Signed)
Message sent to pt MyChart

## 2022-10-17 NOTE — Telephone Encounter (Signed)
Pt notified via My Chart

## 2022-11-07 ENCOUNTER — Telehealth: Payer: Self-pay | Admitting: Family Medicine

## 2022-11-07 NOTE — Telephone Encounter (Addendum)
Riley Hartman from [employer]:  [phone]  Pt's court date is scheduled for 11/17/22.  Riley Hartman called to confirm receipt of fax sent this morning, stating they need said fax: Reviewed  Returned as soon as possible    As of 11 am, no fax received as of yet.

## 2022-11-07 NOTE — Telephone Encounter (Signed)
Follow up.   She inform they are requesting record from 11/21/2021 to present. Request is re-fax to [phone].

## 2022-11-07 NOTE — Telephone Encounter (Signed)
Fax received at 11:45 am.  Fax forwarded to Medical Records - Fax # [phone]  Crist Infante was called to confirm receipt  Alexie will fax them another copy, as well (just in case)

## 2022-11-28 DIAGNOSIS — J019 Acute sinusitis, unspecified: Secondary | ICD-10-CM | POA: Diagnosis not present

## 2022-12-09 ENCOUNTER — Ambulatory Visit (INDEPENDENT_AMBULATORY_CARE_PROVIDER_SITE_OTHER): Payer: BC Managed Care – PPO | Admitting: Family Medicine

## 2022-12-09 ENCOUNTER — Encounter: Payer: Self-pay | Admitting: Family Medicine

## 2022-12-09 ENCOUNTER — Telehealth: Payer: Self-pay | Admitting: Family Medicine

## 2022-12-09 VITALS — BP 118/74 | HR 86 | Wt 212.0 lb

## 2022-12-09 DIAGNOSIS — M10472 Other secondary gout, left ankle and foot: Secondary | ICD-10-CM | POA: Diagnosis not present

## 2022-12-09 DIAGNOSIS — S93602A Unspecified sprain of left foot, initial encounter: Secondary | ICD-10-CM

## 2022-12-09 MED ORDER — METHYLPREDNISOLONE ACETATE 80 MG/ML IJ SUSP
80.0000 mg | Freq: Once | INTRAMUSCULAR | Status: AC
Start: 2022-12-09 — End: 2022-12-09
  Administered 2022-12-09: 80 mg via INTRAMUSCULAR

## 2022-12-09 NOTE — Addendum Note (Signed)
Addended by: Carola Rhine on: 12/09/2022 03:32 PM   Modules accepted: Orders

## 2022-12-09 NOTE — Progress Notes (Signed)
   Subjective:    Patient ID: Riley Hartman, male    DOB: March 02, 1961, 62 y.o.   MRN: [id number]  HPI Here for swelling and severe pain in the left foot that started this morning. He slipped on some stairs at home yesterday morning, causing him to fall, and he landed with his left foot up underneath him. It didn't bother him at all all day yesterday, but this morning is suddenly swelled and became extremely painful. He has not treated it with anything.    Review of Systems  Constitutional: Negative.   Respiratory: Negative.    Cardiovascular: Negative.   Musculoskeletal:  Positive for arthralgias.       Objective:   Physical Exam Constitutional:      Comments: In pain, in a wheelchair   Cardiovascular:     Rate and Rhythm: Normal rate and regular rhythm.     Pulses: Normal pulses.     Heart sounds: Normal heart sounds.  Pulmonary:     Effort: Pulmonary effort is normal.     Breath sounds: Normal breath sounds.  Musculoskeletal:     Comments: The left forefoot is red, warm, swollen, and very tender especially over the great toe MTP joint.   Neurological:     Mental Status: He is alert.           Assessment & Plan:  He had a sprain to the foot yesterday, and now today this has caused a gout attack in the foot. We will get Xrays to rule out any fractures. Given a shot of DepoMedrol.  Gershon Crane, MD

## 2022-12-09 NOTE — Telephone Encounter (Signed)
Patient dropped off document Handicap Placard, to be filled out by provider. Patient requested to send it back via Fax within 5-days. Document is located in providers tray at front office.Please advise at Mobile [phone] (mobile)

## 2022-12-11 NOTE — Telephone Encounter (Signed)
FYI Pt form received and laced on Dr Clent Ridges red folder

## 2022-12-11 NOTE — Telephone Encounter (Signed)
Pt notified to pick up Placard from the office. Form placed in the front office filing cabinet

## 2022-12-11 NOTE — Telephone Encounter (Signed)
The form is ready  

## 2023-01-14 DIAGNOSIS — C61 Malignant neoplasm of prostate: Secondary | ICD-10-CM | POA: Diagnosis not present

## 2023-01-23 ENCOUNTER — Telehealth: Payer: Self-pay | Admitting: Family Medicine

## 2023-01-23 NOTE — Telephone Encounter (Signed)
Prescription Request  01/23/2023  LOV: 12/09/2022  What is the name of the medication or equipment? amphetamine-dextroamphetamine (ADDERALL) 20 MG tablet   Have you contacted your pharmacy to request a refill? No   Which pharmacy would you like this sent to?  [id number] [place], [place] - [address] [place] [address] Phone: [phone] Fax: [phone]    Patient notified that their request is being sent to the clinical staff for review and that they should receive a response within 2 business days.   Please advise at Mobile [phone] (mobile)

## 2023-01-26 MED ORDER — AMPHETAMINE-DEXTROAMPHETAMINE 20 MG PO TABS: 20.0000 mg | ORAL_TABLET | Freq: Two times a day (BID) | ORAL | 0 refills | Status: DC

## 2023-01-26 NOTE — Telephone Encounter (Signed)
Noted  

## 2023-01-26 NOTE — Telephone Encounter (Signed)
Done

## 2023-01-26 NOTE — Addendum Note (Signed)
Addended by: Gershon Crane A on: 01/26/2023 07:52 AM   Modules accepted: Orders

## 2023-02-27 ENCOUNTER — Telehealth: Payer: Self-pay | Admitting: Family Medicine

## 2023-02-27 ENCOUNTER — Other Ambulatory Visit (HOSPITAL_COMMUNITY): Payer: Self-pay

## 2023-02-27 ENCOUNTER — Telehealth: Payer: Self-pay

## 2023-02-27 NOTE — Telephone Encounter (Signed)
Initiated PA via CMM. PA cancelled due to the requested drug is covered and does not require PA. Insurance requires pharmacy to fill certain manufacturers [facility] Patient's pharmacy has tried to order the covered manufacturer but cannot get it in.   Per CMM, In some instances, a pharmacy Clarification Code override may be required to process the claim. The pharmacy may obtain assistance by contacting the [facility] Help Desk at [phone]. [place] pharmacists, please call [phone]. [facility] pharmacists, please call [phone].  Tried to call help desk, addresses on file did not match what the insurance has. Could not continue since address did not match.

## 2023-02-27 NOTE — Telephone Encounter (Signed)
Pt is calling checking on PA for amphetamine-dextroamphetamine (ADDERALL) 20 MG tablet . Pt has medicaid

## 2023-02-27 NOTE — Telephone Encounter (Signed)
Pharmacy Patient Advocate Encounter   Received notification from Pt Calls Messages that prior authorization for Amphetamine-Dextroamphetamine 20MG  tablets is required/requested.   Insurance verification completed.   The patient is insured through [facility] MEDICAID.   Per test claim: CANCELLED due to: the requested medication does not require prior authorization.    Insurance requires a certain NDC to be filled.

## 2023-03-02 NOTE — Telephone Encounter (Signed)
Noted  

## 2023-03-10 ENCOUNTER — Telehealth: Payer: Self-pay | Admitting: Family Medicine

## 2023-03-10 NOTE — Telephone Encounter (Signed)
[id number] - [place], [place] - [address] Phone: [phone]  Fax: [phone]       Pharmacy called to say this particular location cannot fill Pt's Rx, as there is an issue with how much insurance will cover.  Pharmacy stated they will cancel current Rx, on behalf of MD.  Pharmacy suggests Rx be sent to: [id number] - [place], [place] - [address] AT [facility] Phone: [phone]  Fax: [phone]     Which will be more affordable for Pt.   Please advise.

## 2023-03-10 NOTE — Telephone Encounter (Signed)
Pt checking progress of this refill request.

## 2023-03-11 MED ORDER — AMPHETAMINE-DEXTROAMPHETAMINE 20 MG PO TABS: 20.0000 mg | ORAL_TABLET | Freq: Two times a day (BID) | ORAL | 0 refills | Status: DC

## 2023-03-11 NOTE — Telephone Encounter (Signed)
Pt checking on progress of this refill. Says the pharmacy has less than 100 and he would like to have it filled before the weekend.

## 2023-03-11 NOTE — Telephone Encounter (Signed)
Done

## 2023-04-02 NOTE — Telephone Encounter (Signed)
error 

## 2023-06-10 ENCOUNTER — Other Ambulatory Visit: Payer: Self-pay | Admitting: Family Medicine

## 2023-06-10 NOTE — Telephone Encounter (Signed)
 Copied from CRM [id number]. Topic: Clinical - Medication Refill >> Jun 10, 2023 11:36 AM Randa Ngo wrote: Most Recent Primary Care Visit:  Provider: Gershon Crane A  Department: [facility]  Visit Type: OFFICE VISIT  Date: 12/09/2022  Medication: amphetamine-dextroamphetamine (ADDERALL) 20 MG tablet   Has the patient contacted their pharmacy? No (Agent: If no, request that the patient contact the pharmacy for the refill. If patient does not wish to contact the pharmacy document the reason why and proceed with request.) (Agent: If yes, when and what did the pharmacy advise?)  Is this the correct pharmacy for this prescription? Yes If no, delete pharmacy and type the correct one.  This is the patient's preferred pharmacy:   [id number] [place], [place] - [address] AT [facility] [address] [place] [address] Phone: [phone] Fax: [phone]   Has the prescription been filled recently? No  Is the patient out of the medication? No, 2 pills left.  Has the patient been seen for an appointment in the last year OR does the patient have an upcoming appointment? Yes  Can we respond through MyChart? Yes  Agent: Please be advised that Rx refills may take up to 3 business days. We ask that you follow-up with your pharmacy.

## 2023-06-12 ENCOUNTER — Other Ambulatory Visit: Payer: Self-pay | Admitting: Family Medicine

## 2023-06-12 ENCOUNTER — Telehealth: Payer: Self-pay

## 2023-06-12 MED ORDER — AMPHETAMINE-DEXTROAMPHETAMINE 20 MG PO TABS: 20.0000 mg | ORAL_TABLET | Freq: Two times a day (BID) | ORAL | 0 refills | Status: DC

## 2023-06-12 NOTE — Telephone Encounter (Signed)
 Done

## 2023-06-12 NOTE — Telephone Encounter (Signed)
 Copied from CRM [id number]. Topic: Clinical - Prescription Issue >> Jun 12, 2023  4:45 PM Riley Hartman wrote: Reason for CRM: pt called stating his [facility] the wrong [facility], [facility]. Pt is calling to request the medication be resent to [facility] as soon as possible. Please call pt back at [phone]

## 2023-06-12 NOTE — Telephone Encounter (Signed)
 Copied from CRM [id number]. Topic: Clinical - Medication Question >> Jun 12, 2023  1:51 PM Almira Coaster wrote: Reason for CRM: Patient contacted their local [facility] and they have not received the prescription for amphetamine-dextroamphetamine (ADDERALL) 20 MG tablet, patient is asking if we can call and give a verbal order.

## 2023-06-12 NOTE — Telephone Encounter (Signed)
 Copied from CRM [id number]. Topic: Clinical - Medication Refill >> Jun 12, 2023 10:43 AM Lennart Pall wrote: Most Recent Primary Care Visit:  Provider: Gershon Crane A  Department: [facility]  Visit Type: OFFICE VISIT  Date: 12/09/2022  Medication: amphetamine-dextroamphetamine (ADDERALL) 20 MG tablet  Has the patient contacted their pharmacy? Yes (Agent: If no, request that the patient contact the pharmacy for the refill. If patient does not wish to contact the pharmacy document the reason why and proceed with request.) (Agent: If yes, when and what did the pharmacy advise?)  Is this the correct pharmacy for this prescription? Yes If no, delete pharmacy and type the correct one.  This is the patient's preferred pharmacy:   [id number] [place], [place] - [address] AT [facility] [address] [place] [address] Phone: [phone] Fax: [phone]   Has the prescription been filled recently? Yes  Is the patient out of the medication? Yes  Has the patient been seen for an appointment in the last year OR does the patient have an upcoming appointment? Yes  Can we respond through MyChart? Yes  Agent: Please be advised that Rx refills may take up to 3 business days. We ask that you follow-up with your pharmacy.

## 2023-06-12 NOTE — Telephone Encounter (Signed)
I refilled this earlier today.

## 2023-06-12 NOTE — Telephone Encounter (Signed)
 Last Fill: Adderall: 03/11/23 3 scripts, 60 tabs/0 RF  Last OV: 12/09/22 Next OV: None Scheduled  Routing to provider for review/authorization.

## 2023-06-12 NOTE — Addendum Note (Signed)
 Addended by: Gershon Crane A on: 06/12/2023 05:01 PM   Modules accepted: Orders

## 2023-06-12 NOTE — Telephone Encounter (Signed)
 Pt LOV was on 12/09/22 Last refill was done on 03/11/23 Please advise

## 2023-06-15 ENCOUNTER — Other Ambulatory Visit: Payer: Self-pay

## 2023-06-15 NOTE — Telephone Encounter (Signed)
 Spoke with pt stated that the pharmacy will have it in stock tomorrow.

## 2023-06-15 NOTE — Telephone Encounter (Signed)
 Spoke with pt states that he will pick up Rx tomorrow pharmacy will get the stock in

## 2023-06-17 ENCOUNTER — Telehealth: Payer: Self-pay

## 2023-06-17 MED ORDER — AMPHETAMINE-DEXTROAMPHETAMINE 20 MG PO TABS: 20.0000 mg | ORAL_TABLET | Freq: Two times a day (BID) | ORAL | 0 refills | Status: DC

## 2023-06-17 NOTE — Telephone Encounter (Signed)
The RX is ready to pick up

## 2023-06-17 NOTE — Telephone Encounter (Signed)
 Copied from CRM [id number]. Topic: Clinical - Prescription Issue >> Jun 17, 2023 12:17 PM Riley Hartman wrote: Reason for CRM: Pt called to request a paper prescription for aderal, states the pharmacy does not have it. He is requesting to come and pick up the prescription if possible. Please call pt back at . [phone] pt is requesting a call back from nancy

## 2023-06-17 NOTE — Addendum Note (Signed)
 Addended by: Gershon Crane A on: 06/17/2023 04:54 PM   Modules accepted: Orders

## 2023-06-18 NOTE — Telephone Encounter (Signed)
 Pt notified to pick up Rx from the office. Rx placed in the filing cabinet in the front office

## 2023-06-20 ENCOUNTER — Other Ambulatory Visit: Payer: Self-pay | Admitting: Family Medicine

## 2023-07-02 ENCOUNTER — Encounter: Payer: Medicaid Other | Admitting: Family Medicine

## 2023-07-16 ENCOUNTER — Ambulatory Visit (INDEPENDENT_AMBULATORY_CARE_PROVIDER_SITE_OTHER): Admitting: Family Medicine

## 2023-07-16 ENCOUNTER — Encounter: Payer: Self-pay | Admitting: Family Medicine

## 2023-07-16 VITALS — BP 120/76 | HR 81 | Temp 98.0°F | Ht 72.0 in | Wt 206.0 lb

## 2023-07-16 DIAGNOSIS — R7309 Other abnormal glucose: Secondary | ICD-10-CM

## 2023-07-16 DIAGNOSIS — F909 Attention-deficit hyperactivity disorder, unspecified type: Secondary | ICD-10-CM | POA: Diagnosis not present

## 2023-07-16 DIAGNOSIS — M545 Low back pain, unspecified: Secondary | ICD-10-CM

## 2023-07-16 DIAGNOSIS — G8929 Other chronic pain: Secondary | ICD-10-CM

## 2023-07-16 DIAGNOSIS — E785 Hyperlipidemia, unspecified: Secondary | ICD-10-CM

## 2023-07-16 DIAGNOSIS — I10 Essential (primary) hypertension: Secondary | ICD-10-CM

## 2023-07-16 DIAGNOSIS — C61 Malignant neoplasm of prostate: Secondary | ICD-10-CM

## 2023-07-16 DIAGNOSIS — F419 Anxiety disorder, unspecified: Secondary | ICD-10-CM

## 2023-07-16 DIAGNOSIS — I6330 Cerebral infarction due to thrombosis of unspecified cerebral artery: Secondary | ICD-10-CM

## 2023-07-16 DIAGNOSIS — I2510 Atherosclerotic heart disease of native coronary artery without angina pectoris: Secondary | ICD-10-CM

## 2023-07-16 LAB — BASIC METABOLIC PANEL WITH GFR
BUN: 18 mg/dL (ref 6–23)
CO2: 28 meq/L (ref 19–32)
Calcium: 9.3 mg/dL (ref 8.4–10.5)
Chloride: 104 meq/L (ref 96–112)
Creatinine, Ser: 0.89 mg/dL (ref 0.40–1.50)
GFR: 90.39 mL/min (ref >60.00)
Glucose, Bld: 122 mg/dL — ABNORMAL HIGH (ref 70–99)
Potassium: 4.5 meq/L (ref 3.5–5.1)
Sodium: 139 meq/L (ref 135–145)

## 2023-07-16 LAB — CBC WITH DIFFERENTIAL/PLATELET
Basophils Absolute: 0.1 10*3/uL (ref 0.0–0.1)
Basophils Relative: 1 % (ref 0.0–3.0)
Eosinophils Absolute: 0.3 10*3/uL (ref 0.0–0.7)
Eosinophils Relative: 3.9 % (ref 0.0–5.0)
HCT: 42.1 % (ref 39.0–52.0)
Hemoglobin: 13.8 g/dL (ref 13.0–17.0)
Lymphocytes Relative: 21.2 % (ref 12.0–46.0)
Lymphs Abs: 1.6 10*3/uL (ref 0.7–4.0)
MCHC: 32.8 g/dL (ref 30.0–36.0)
MCV: 86.2 fl (ref 78.0–100.0)
Monocytes Absolute: 0.4 10*3/uL (ref 0.1–1.0)
Monocytes Relative: 5.4 % (ref 3.0–12.0)
Neutro Abs: 5.3 10*3/uL (ref 1.4–7.7)
Neutrophils Relative %: 68.5 % (ref 43.0–77.0)
Platelets: 229 10*3/uL (ref 150.0–400.0)
RBC: 4.89 Mil/uL (ref 4.22–5.81)
RDW: 14.1 % (ref 11.5–15.5)
WBC: 7.8 10*3/uL (ref 4.0–10.5)

## 2023-07-16 LAB — PSA: PSA: 0 ng/mL — ABNORMAL LOW (ref 0.10–4.00)

## 2023-07-16 LAB — HEPATIC FUNCTION PANEL
ALT: 15 U/L (ref 0–53)
AST: 13 U/L (ref 0–37)
Albumin: 4.5 g/dL (ref 3.5–5.2)
Alkaline Phosphatase: 67 U/L (ref 39–117)
Bilirubin, Direct: 0.1 mg/dL (ref 0.0–0.3)
Total Bilirubin: 0.4 mg/dL (ref 0.2–1.2)
Total Protein: 7 g/dL (ref 6.0–8.3)

## 2023-07-16 LAB — LIPID PANEL
Cholesterol: 207 mg/dL — ABNORMAL HIGH (ref 0–200)
HDL: 38.5 mg/dL — ABNORMAL LOW (ref >39.00)
LDL Cholesterol: 113 mg/dL — ABNORMAL HIGH (ref 0–99)
NonHDL: 168.91
Total CHOL/HDL Ratio: 5
Triglycerides: 282 mg/dL — ABNORMAL HIGH (ref 0.0–149.0)
VLDL: 56.4 mg/dL — ABNORMAL HIGH (ref 0.0–40.0)

## 2023-07-16 LAB — HEMOGLOBIN A1C: Hgb A1c MFr Bld: 6.1 % (ref 4.6–6.5)

## 2023-07-16 LAB — TSH: TSH: 2.69 u[IU]/mL (ref 0.35–5.50)

## 2023-07-16 MED ORDER — AMLODIPINE BESYLATE 5 MG PO TABS: 5.0000 mg | ORAL_TABLET | Freq: Every day | ORAL | 3 refills | Status: AC

## 2023-07-16 MED ORDER — AMPHETAMINE-DEXTROAMPHETAMINE 20 MG PO TABS: 20.0000 mg | ORAL_TABLET | Freq: Two times a day (BID) | ORAL | 0 refills | Status: DC

## 2023-07-16 MED ORDER — LISINOPRIL 20 MG PO TABS: 20.0000 mg | ORAL_TABLET | Freq: Every day | ORAL | 3 refills | Status: AC

## 2023-07-16 NOTE — Progress Notes (Signed)
 Subjective:    Patient ID: Riley Hartman, male    DOB: Feb 08, 1961, 63 y.o.   MRN: [id number]  HPI Here to follow up on issues. He has no acute complaints today. He is still seeing [facility] for his prostate cancer, and he has completed 2 years of treatment with Zytiga. His last PSA on 01-14-23 was <0.01. His BP has been stable. His anxiety and back pain are stable. His ADHD is stable.    Review of Systems  Constitutional: Negative.   HENT: Negative.    Eyes: Negative.   Respiratory: Negative.    Cardiovascular: Negative.   Gastrointestinal: Negative.   Genitourinary: Negative.   Musculoskeletal: Negative.   Skin: Negative.   Neurological: Negative.   Psychiatric/Behavioral: Negative.         Objective:   Physical Exam Constitutional:      General: He is not in acute distress.    Appearance: Normal appearance. He is well-developed. He is not diaphoretic.  HENT:     Head: Normocephalic and atraumatic.     Right Ear: External ear normal.     Left Ear: External ear normal.     Nose: Nose normal.     Mouth/Throat:     Pharynx: No oropharyngeal exudate.  Eyes:     General: No scleral icterus.       Right eye: No discharge.        Left eye: No discharge.     Conjunctiva/sclera: Conjunctivae normal.     Pupils: Pupils are equal, round, and reactive to light.  Neck:     Thyroid: No thyromegaly.     Vascular: No JVD.     Trachea: No tracheal deviation.  Cardiovascular:     Rate and Rhythm: Normal rate and regular rhythm.     Pulses: Normal pulses.     Heart sounds: Normal heart sounds. No murmur heard.    No friction rub. No gallop.  Pulmonary:     Effort: Pulmonary effort is normal. No respiratory distress.     Breath sounds: Normal breath sounds. No wheezing or rales.  Chest:     Chest wall: No tenderness.  Abdominal:     General: Bowel sounds are normal. There is no distension.     Palpations: Abdomen is soft. There is no mass.     Tenderness: There is no  abdominal tenderness. There is no guarding or rebound.  Genitourinary:    Penis: Normal. No tenderness.      Testes: Normal.  Musculoskeletal:        General: No tenderness. Normal range of motion.     Cervical back: Neck supple.  Lymphadenopathy:     Cervical: No cervical adenopathy.  Skin:    General: Skin is warm and dry.     Coloration: Skin is not pale.     Findings: No erythema or rash.  Neurological:     General: No focal deficit present.     Mental Status: He is alert and oriented to person, place, and time.     Cranial Nerves: No cranial nerve deficit.     Motor: No abnormal muscle tone.     Coordination: Coordination normal.     Deep Tendon Reflexes: Reflexes are normal and symmetric. Reflexes normal.  Psychiatric:        Mood and Affect: Mood normal.        Behavior: Behavior normal.        Thought Content: Thought content normal.  Judgment: Judgment normal.           Assessment & Plan:  His HTN and CAD are stable. His ADHD and anxiety are stable. He will follow up with [facility] for the prostate cancer, and we will check a PSA today. His low back pain is stable. We will get fasting labs to check lipids, an A1c, etc. We spent a total of ( 34  ) minutes reviewing records and discussing these issues.  Gershon Crane, MD

## 2023-09-15 ENCOUNTER — Telehealth: Payer: Self-pay

## 2023-09-15 NOTE — Telephone Encounter (Signed)
 Copied from CRM [id number]. Topic: Appointments - Transfer of Care >> Sep 15, 2023 11:30 AM Tiffany S wrote: Pt is requesting to transfer FROM: Dr Corita Diego  Pt is requesting to transfer TO: Dr Odilia Bennett  Reason for requested transfer: Closer to home It is the responsibility of the team the patient would like to transfer to (Dr. Odilia Bennett ) to reach out to the patient if for any reason this transfer is not acceptable.

## 2023-09-17 ENCOUNTER — Encounter: Admitting: Physician Assistant

## 2023-10-27 ENCOUNTER — Other Ambulatory Visit: Payer: Self-pay | Admitting: Family Medicine

## 2023-10-27 NOTE — Telephone Encounter (Unsigned)
 Copied from CRM [id number]. Topic: Clinical - Medication Refill >> Oct 27, 2023 10:31 AM Gennette ORN wrote: Medication: amphetamine -dextroamphetamine  (ADDERALL) 20 MG tablet  Has the patient contacted their pharmacy? Yes (Agent: If no, request that the patient contact the pharmacy for the refill. If patient does not wish to contact the pharmacy document the reason why and proceed with request.) (Agent: If yes, when and what did the pharmacy advise?)  This is the patient's preferred pharmacy:  [id number] [place], [place] - [address] AT [facility] OF [facility] [address] & [facility] [address] [place] [address] Phone: [phone] Fax: [phone]  Is this the correct pharmacy for this prescription? Yes If no, delete pharmacy and type the correct one.   Has the prescription been filled recently? Yes  Is the patient out of the medication? Yes  Has the patient been seen for an appointment in the last year OR does the patient have an upcoming appointment? Yes  Can we respond through MyChart? Yes  Agent: Please be advised that Rx refills may take up to 3 business days. We ask that you follow-up with your pharmacy.

## 2023-10-28 MED ORDER — AMPHETAMINE-DEXTROAMPHETAMINE 20 MG PO TABS: 20.0000 mg | ORAL_TABLET | Freq: Two times a day (BID) | ORAL | 0 refills | Status: DC

## 2023-10-28 NOTE — Telephone Encounter (Signed)
 Done

## 2023-10-28 NOTE — Telephone Encounter (Signed)
 Pt LOV was on 07/16/23 Last refill was done on 07/16/23 Please advise

## 2023-12-10 ENCOUNTER — Other Ambulatory Visit (HOSPITAL_COMMUNITY): Payer: Self-pay

## 2023-12-10 ENCOUNTER — Telehealth: Payer: Self-pay

## 2023-12-10 NOTE — Telephone Encounter (Signed)
 Pharmacy Patient Advocate Encounter   Received notification from Onbase that prior authorization for Amphetamine -Dextroamphetamine  20MG  tablets is required/requested.   Insurance verification completed.   The patient is insured through [facility] .   Per test claim: Refill too soon. PA is not needed at this time. Medication was filled 12/07/23. Next eligible fill date is 12/30/23.

## 2024-02-06 ENCOUNTER — Other Ambulatory Visit: Payer: Self-pay | Admitting: Family Medicine

## 2024-02-08 MED ORDER — TAMSULOSIN HCL 0.4 MG PO CAPS: 0.4000 mg | ORAL_CAPSULE | Freq: Every day | ORAL | 3 refills | Status: AC

## 2024-02-08 NOTE — Telephone Encounter (Signed)
 Done

## 2024-02-08 NOTE — Telephone Encounter (Unsigned)
 Copied from CRM [id number]. Topic: Clinical - Medication Refill >> Feb 08, 2024  1:56 PM Frederich PARAS wrote: Medication: amphetamine  (adderall) 20mg   Has the patient contacted their pharmacy? Yes They told to get a new prescription    This is the patient's preferred pharmacy:  [id number] [place], [place] - [address] AT [facility] OF [facility] [address] & [facility] [address] [place] [address] Phone: [phone] Fax: [phone]    Is this the correct pharmacy for this prescription? Yes If no, delete pharmacy and type the correct one.   Has the prescription been filled recently? Yes  Is the patient out of the medication? Yes  Has the patient been seen for an appointment in the last year OR does the patient have an upcoming appointment? Yes  Can we respond through MyChart? No only phone   Agent: Please be advised that Rx refills may take up to 3 business days. We ask that you follow-up with your pharmacy.

## 2024-02-08 NOTE — Addendum Note (Signed)
 Addended by: JOHNNY SENIOR A on: 02/08/2024 10:23 AM   Modules accepted: Orders

## 2024-02-10 ENCOUNTER — Ambulatory Visit (INDEPENDENT_AMBULATORY_CARE_PROVIDER_SITE_OTHER): Payer: Self-pay | Admitting: Family Medicine

## 2024-02-10 ENCOUNTER — Encounter: Payer: Self-pay | Admitting: Family Medicine

## 2024-02-10 VITALS — BP 136/80 | HR 61 | Temp 97.8°F | Ht 72.0 in | Wt 207.0 lb

## 2024-02-10 DIAGNOSIS — I10 Essential (primary) hypertension: Secondary | ICD-10-CM | POA: Diagnosis not present

## 2024-02-10 DIAGNOSIS — F419 Anxiety disorder, unspecified: Secondary | ICD-10-CM | POA: Diagnosis not present

## 2024-02-10 DIAGNOSIS — Z23 Encounter for immunization: Secondary | ICD-10-CM

## 2024-02-10 DIAGNOSIS — R7309 Other abnormal glucose: Secondary | ICD-10-CM | POA: Diagnosis not present

## 2024-02-10 DIAGNOSIS — F909 Attention-deficit hyperactivity disorder, unspecified type: Secondary | ICD-10-CM

## 2024-02-10 LAB — POCT GLYCOSYLATED HEMOGLOBIN (HGB A1C): Hemoglobin A1C: 6 % — AB (ref 4.0–5.6)

## 2024-02-10 MED ORDER — FINASTERIDE 5 MG PO TABS: 5.0000 mg | ORAL_TABLET | Freq: Every day | ORAL | 3 refills | Status: AC

## 2024-02-10 MED ORDER — AMPHETAMINE-DEXTROAMPHETAMINE 20 MG PO TABS: 20.0000 mg | ORAL_TABLET | Freq: Two times a day (BID) | ORAL | 0 refills | Status: DC

## 2024-02-10 MED ORDER — CLONAZEPAM 1 MG PO TABS: 1.0000 mg | ORAL_TABLET | Freq: Two times a day (BID) | ORAL | 5 refills | Status: AC | PRN

## 2024-02-10 NOTE — Progress Notes (Signed)
   Subjective:    Patient ID: Riley Hartman, male    DOB: 06/11/1960, 63 y.o.   MRN: [id number]  HPI Here to follow up on HTN, hyperglycemia, anxiety, and ADHD. He has bee doing well in general. He feels the anxiety and ADHD are adequately treated. His A1c today is 6.0%.    Review of Systems  Constitutional: Negative.   Respiratory: Negative.    Cardiovascular: Negative.   Psychiatric/Behavioral: Negative.         Objective:   Physical Exam Constitutional:      Appearance: Normal appearance.  Cardiovascular:     Rate and Rhythm: Normal rate and regular rhythm.     Pulses: Normal pulses.     Heart sounds: Normal heart sounds.  Pulmonary:     Effort: Pulmonary effort is normal.     Breath sounds: Normal breath sounds.  Neurological:     Mental Status: He is alert.           Assessment & Plan:  His ADHD is stable so we refilled the Adderall. His anxiety is stable so we refilled the Clonazepam . His HTN is well controlled. His prediabetes is stable.  Garnette Olmsted, MD

## 2024-05-09 ENCOUNTER — Telehealth: Payer: Self-pay

## 2024-05-09 ENCOUNTER — Encounter: Payer: Self-pay | Admitting: Family Medicine

## 2024-05-09 ENCOUNTER — Telehealth: Admitting: Family Medicine

## 2024-05-09 DIAGNOSIS — J0100 Acute maxillary sinusitis, unspecified: Secondary | ICD-10-CM

## 2024-05-09 MED ORDER — AZITHROMYCIN 250 MG PO TABS: ORAL_TABLET | ORAL | 0 refills | Status: AC

## 2024-05-09 MED ORDER — PREDNISONE 20 MG PO TABS: 40.0000 mg | ORAL_TABLET | Freq: Every day | ORAL | 0 refills | Status: AC

## 2024-05-09 NOTE — Telephone Encounter (Signed)
 Copied from CRM #[id number]. Topic: Clinical - Prescription Issue >> May 09, 2024  2:11 PM Maisie C wrote: Reason for CRM: pt called and stated that per the pharmacy, they did not get the prescription for azithromycin . Please advise.

## 2024-05-09 NOTE — Telephone Encounter (Signed)
 Copied from CRM [id number]. Topic: Clinical - Medication Refill >> May 09, 2024 11:13 AM Rea C wrote: Medication: amphetamine -dextroamphetamine  (ADDERALL) 20 MG tablet  Has the patient contacted their pharmacy? No (Agent: If no, request that the patient contact the pharmacy for the refill. If patient does not wish to contact the pharmacy document the reason why and proceed with request.) (Agent: If yes, when and what did the pharmacy advise?)  This is the patient's preferred pharmacy:  [id number] [place], [place] - [address] AT [facility] OF [facility] [address] & [facility] [address] [place] [address] Phone: [phone] Fax: [phone]  Is this the correct pharmacy for this prescription? Yes If no, delete pharmacy and type the correct one.   Has the prescription been filled recently? Yes  Is the patient out of the medication? Yes  Has the patient been seen for an appointment in the last year OR does the patient have an upcoming appointment? Yes  Can we respond through MyChart? Yes  Agent: Please be advised that Rx refills may take up to 3 business days. We ask that you follow-up with your pharmacy.
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
## 2024-05-09 NOTE — Telephone Encounter (Signed)
 Called patient and pharmacy, pharmacy had not reach medication, so medication was called in

## 2024-05-10 MED ORDER — AMPHETAMINE-DEXTROAMPHETAMINE 20 MG PO TABS: 20.0000 mg | ORAL_TABLET | Freq: Two times a day (BID) | ORAL | 0 refills | Status: AC

## 2024-05-10 NOTE — Addendum Note (Signed)
 Addended by: JOHNNY SENIOR A on: 05/10/2024 08:54 AM   Modules accepted: Orders

## 2024-05-10 NOTE — Telephone Encounter (Signed)
 Done
